# Patient Record
Sex: Male | Born: 1965
Health system: Southern US, Community
[De-identification: ages and names within clinical notes are randomized; demographics above are authoritative.]

## PROBLEM LIST (undated history)

## (undated) DIAGNOSIS — I10 Essential (primary) hypertension: Secondary | ICD-10-CM

## (undated) DIAGNOSIS — M1990 Unspecified osteoarthritis, unspecified site: Secondary | ICD-10-CM

## (undated) DIAGNOSIS — E7800 Pure hypercholesterolemia, unspecified: Secondary | ICD-10-CM

## (undated) DIAGNOSIS — K219 Gastro-esophageal reflux disease without esophagitis: Secondary | ICD-10-CM

## (undated) DIAGNOSIS — F419 Anxiety disorder, unspecified: Secondary | ICD-10-CM

## (undated) HISTORY — DX: Gastro-esophageal reflux disease without esophagitis: K21.9

## (undated) HISTORY — PX: BONE CYST EXCISION: SHX376

## (undated) HISTORY — DX: Anxiety disorder, unspecified: F41.9

## (undated) HISTORY — DX: Essential (primary) hypertension: I10

## (undated) HISTORY — PX: INCISION AND DRAINAGE: SHX5863

---

## 2004-04-02 ENCOUNTER — Ambulatory Visit (HOSPITAL_COMMUNITY): Admission: RE | Admit: 2004-04-02 | Discharge: 2004-04-02 | Payer: Self-pay | Admitting: Family Medicine

## 2004-04-03 ENCOUNTER — Ambulatory Visit (HOSPITAL_COMMUNITY): Admission: RE | Admit: 2004-04-03 | Discharge: 2004-04-03 | Payer: Self-pay | Admitting: Family Medicine

## 2006-03-19 ENCOUNTER — Ambulatory Visit (HOSPITAL_COMMUNITY): Admission: RE | Admit: 2006-03-19 | Discharge: 2006-03-19 | Payer: Self-pay | Admitting: Family Medicine

## 2007-02-05 ENCOUNTER — Inpatient Hospital Stay (HOSPITAL_COMMUNITY): Admission: EM | Admit: 2007-02-05 | Discharge: 2007-02-07 | Payer: Self-pay | Admitting: Emergency Medicine

## 2011-02-04 NOTE — Op Note (Signed)
NAME:  Victor Conner, Victor Conner NO.:  [id number]   MEDICAL RECORD NO.:  [id number]          PATIENT TYPE:  OBV   LOCATION:  5114                         FACILITY:  [facility]   PHYSICIAN:  Artist Pais. Weingold, M.D.DATE OF BIRTH:  12-Nov-1965   DATE OF PROCEDURE:  02/07/2007  DATE OF DISCHARGE:                               OPERATIVE REPORT   PREOPERATIVE DIAGNOSIS:  Right hand web space abscess.   POSTOPERATIVE DIAGNOSIS:  Right hand web space abscess.   PROCEDURE:  Repeat I&D and secondary wound closure over a 1/4-inch  Penrose drain.   SURGEON:  Artist Pais. Mina Marble, M.D.   ASSISTANT:  None.   ANESTHESIA:  General.   TOURNIQUET:  No tourniquet time.   COMPLICATIONS:  No complications.   OPERATIVE REPORT:  The patient was taken to the operating suite.  After  the induction of adequate general anesthesia, the right upper extremity  was prepped and draped in sterile fashion.  Once this was done, the  previously irrigated and debrided wound was debrided of clot.  The deep  tissues appeared to be healthy with early granulation tissue.  This  wound was then thoroughly irrigated using 3 L normal saline under pulse  lavage.  It was then closed secondarily over a Penrose drain with 2-0  nylon.  A sterile dressing of Xeroform, 4 x 4s and a compression wrap  was applied.  Patient tolerated the procedure well.      Artist Pais Mina Marble, M.D.  Electronically Signed     MAW/MEDQ  D:  02/07/2007  T:  02/07/2007  Job:  [id number]

## 2011-02-04 NOTE — Op Note (Signed)
NAME:  Victor Conner, Victor Conner NO.:  [id number]   MEDICAL RECORD NO.:  [id number]          PATIENT TYPE:  OBV   LOCATION:  2550                         FACILITY:  [facility]   PHYSICIAN:  Artist Pais. Weingold, M.D.DATE OF BIRTH:  02-Apr-1966   DATE OF PROCEDURE:  02/05/2007  DATE OF DISCHARGE:                               OPERATIVE REPORT   PREOPERATIVE DIAGNOSIS:  Deep abscess, right hand web space between  thumb and index finger dorsally.   POSTOPERATIVE DIAGNOSIS:  Deep abscess, right hand web space between  thumb and index finger dorsally.   PROCEDURE:  Irrigation and debridement of above with culturing and open  packing.   SURGEON:  Artist Pais. Mina Marble, M.D.   ASSISTANT:  None.   ANESTHESIA:  General.   TOURNIQUET TIME:  28 minutes.   COMPLICATIONS:  None.   DRAINS:  None.   SPECIMENS:  Cultures x2 sent.   OPERATIVE REPORT:  The patient was taken to the operating suite after  induction of general anesthesia.  The right upper extremity was prepped  and draped in the usual sterile fashion.  An Esmarch was used to  exsanguinate the limb.  The tourniquet was inflated to 250 mmHg.  At  this point in time, the web space between thumb and index finger on the  right hand was incised longitudinally.  Purulence was encountered.  The  rest of the skin was incised.  There was a large, deep abscess going  down to the muscle between the thumb and index finger and the first  dorsal intraosseous muscle.  This was cultured.  Purulent material again  was encountered upon incising the skin.  All devitalized tissue was  removed using a rongeur and tenotomy scissors, was thoroughly irrigated  with 2 liters normal saline under pulsatile lavage.  It was then packed  open with 1/4 inch Iodoform gauze.  The patient was then placed using  sterile dressing, 4 x 4's, fluffs and a compression wrap.  The patient  tolerated the procedure well.      Artist Pais Mina Marble, M.D.  Electronically Signed     MAW/MEDQ  D:  02/05/2007  T:  02/06/2007  Job:  [id number]

## 2011-02-04 NOTE — Consult Note (Signed)
NAME:  DUSAN, LIPFORD NO.:  [id number]   MEDICAL RECORD NO.:  [id number]          PATIENT TYPE:  OBV   LOCATION:  2550                         FACILITY:  [facility]   PHYSICIAN:  Artist Pais. Weingold, M.D.DATE OF BIRTH:  29-Oct-1965   DATE OF CONSULTATION:  02/05/2007  DATE OF DISCHARGE:                                 CONSULTATION   REASON FOR CONSULTATION:  Mr. Falletta is a 45 year old left hand dominant  male who presents today with what appears to be a large MRSA abscess on  the dorsal aspect of his nondominant right hand between the thumb and  index finger in the web space overlying the first dorsal interosseous  muscle.   HISTORY OF PRESENT ILLNESS:  He is 45 years old.  He has no known drug  allergies.  He started taking Sudafed only.  He was seen at [facility],  given Rocephin yesterday, and returned today with worsening in his  symptoms.  He presented with an obvious large abscess on his right hand.  Again, he has no known drug allergies, takes Sudafed only.  X-rays from  the outside facility were, by history, negative.  He denies any recent  other infections or abscesses, but says he may have lacerated his hand a  week ago and he works doing heating and air.   PHYSICAL EXAMINATION:  Examination reveals a large 3 x 4 cm raised, red  area in the web space between the thumb and index finger dorsally with  purulent discharge.   IMPRESSION:  We have a 45 year old male with a large abscess, dorsal  aspect of his nondominant right hand.   RECOMMENDATIONS:  Recommendation at this point in time would be to take  him to the operating room for I-and-D for a suspected large MRSA  abscess.  He will be admitted for IV vancomycin, cultures, repeat I-and-  D as necessary.      Artist Pais Mina Marble, M.D.  Electronically Signed     MAW/MEDQ  D:  02/05/2007  T:  02/06/2007  Job:  [id number]

## 2018-04-29 ENCOUNTER — Ambulatory Visit (INDEPENDENT_AMBULATORY_CARE_PROVIDER_SITE_OTHER): Payer: Self-pay | Admitting: Physician Assistant

## 2018-05-06 ENCOUNTER — Encounter (INDEPENDENT_AMBULATORY_CARE_PROVIDER_SITE_OTHER): Payer: Self-pay | Admitting: Physician Assistant

## 2018-05-06 ENCOUNTER — Ambulatory Visit (INDEPENDENT_AMBULATORY_CARE_PROVIDER_SITE_OTHER): Payer: 59 | Admitting: Physician Assistant

## 2018-05-06 ENCOUNTER — Ambulatory Visit (INDEPENDENT_AMBULATORY_CARE_PROVIDER_SITE_OTHER): Payer: 59

## 2018-05-06 DIAGNOSIS — M1712 Unilateral primary osteoarthritis, left knee: Secondary | ICD-10-CM

## 2018-05-06 NOTE — Progress Notes (Signed)
   Office Visit Note   Patient: Victor Conner           Date of Birth: [birth year]           MRN: [id number] Visit Date: 05/06/2018              Requested by: No referring provider defined for this encounter. PCP: Lucia Gaskins, MD   Assessment & Plan: Visit Diagnoses:  1. Primary osteoarthritis of left knee     Plan: Discussed with patient the knee friendly exercises.  Showed him quad strengthening exercises.  Discussed weight loss.  Conservative management consist over-the-counter NSAIDs and natural anti-inflammatories which were discussed with him.  Offered cortisone injection left knee he defers.  See him back on an as-needed basis pain persist or becomes worse.  Follow-Up Instructions: Return if symptoms worsen or fail to improve.   Orders:  Orders Placed This Encounter  Procedures  . XR KNEE 3 VIEW LEFT   No orders of the defined types were placed in this encounter.     Procedures: No procedures performed   Clinical Data: No additional findings.   Subjective: Chief Complaint  Patient presents with  . Left Knee - Pain    HPI [name] 52 year old male comes in today with left knee pain ongoing for the past year.  Has had no known injury.  He did buy an over-the-counter knee brace which is helped some with his pain.  He also states he is lost 8 pounds and this is helped some with his knee pain.  Takes Aleve on bad days.  Does have some painful popping but no locking catching or giving way.  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  Therefore he is crawling around on his knees a lot.  He does take a blood pressure medication reflux medication he is unsure of the names. Review of Systems Please see HPI otherwise negative  Objective: Vital Signs: 6 foot 4 274 pounds  Physical Exam  Constitutional: He is oriented to person, place, and time. He appears well-developed and well-nourished. No distress.  Pulmonary/Chest: Effort normal.  Neurological: He is  alert and oriented to person, place, and time.  Skin: He is not diaphoretic.    Ortho Exam Bilateral knees good range of motion.  Significant patellofemoral crepitus bilateral knees.  Slight tenderness of the medial joint line of the left knee only.  No instability with valgus varus stressing.  Does have some callus formation of the patella region of both knees.  No effusion abnormal warmth erythema of either knee. Specialty Comments:  No specialty comments available.  Imaging: Xr Knee 3 View Left  Result Date: 05/06/2018 Left knee 3 views: Tricompartmental changes with severe bone-on-bone medial compartmental arthritis.  Mild lateral compartmental arthritic changes.  Moderate patellofemoral changes.  No acute fractures.    PMFS History: There are no active problems to display for this patient.  History reviewed. No pertinent past medical history.  History reviewed. No pertinent family history.  History reviewed. No pertinent surgical history. Social History   Occupational History  . Not on file  Tobacco Use  . Smoking status: Not on file  Substance and Sexual Activity  . Alcohol use: Not on file  . Drug use: Not on file  . Sexual activity: Not on file

## 2019-04-27 ENCOUNTER — Other Ambulatory Visit: Payer: Self-pay | Admitting: Family Medicine

## 2019-04-27 ENCOUNTER — Other Ambulatory Visit (HOSPITAL_COMMUNITY): Payer: Self-pay | Admitting: Family Medicine

## 2019-04-27 DIAGNOSIS — M542 Cervicalgia: Secondary | ICD-10-CM

## 2019-05-02 ENCOUNTER — Other Ambulatory Visit: Payer: Self-pay

## 2019-05-02 ENCOUNTER — Ambulatory Visit (HOSPITAL_COMMUNITY)
Admission: RE | Admit: 2019-05-02 | Discharge: 2019-05-02 | Disposition: A | Discharge status: Home / Self Care | Payer: BLUE CROSS/BLUE SHIELD | Source: Ambulatory Visit | Attending: Family Medicine | Admitting: Family Medicine

## 2019-05-02 DIAGNOSIS — M542 Cervicalgia: Secondary | ICD-10-CM | POA: Diagnosis not present

## 2019-05-04 ENCOUNTER — Encounter: Payer: Self-pay | Admitting: Orthopaedic Surgery

## 2019-05-04 ENCOUNTER — Ambulatory Visit (INDEPENDENT_AMBULATORY_CARE_PROVIDER_SITE_OTHER): Payer: BLUE CROSS/BLUE SHIELD | Admitting: Orthopaedic Surgery

## 2019-05-04 ENCOUNTER — Ambulatory Visit: Payer: Self-pay

## 2019-05-04 VITALS — BP 150/99 | HR 90 | Ht 75.0 in | Wt 270.0 lb

## 2019-05-04 DIAGNOSIS — M4802 Spinal stenosis, cervical region: Secondary | ICD-10-CM | POA: Insufficient documentation

## 2019-05-04 DIAGNOSIS — M542 Cervicalgia: Secondary | ICD-10-CM

## 2019-05-04 NOTE — Progress Notes (Addendum)
Office Visit Note   Patient: Victor Conner           Date of Birth: 02/02/1966           MRN: [id number] Visit Date: 05/04/2019              Requested by: Lucia Gaskins, MD [id number] [place],  [place] [address] PCP: Lucia Gaskins, MD   Assessment & Plan: Visit Diagnoses:  1. Neck pain   2. Spinal stenosis of cervical region            With myelopathy  Plan: Patient has myelopathy from cervical compression with only a 4 to 5 mm canal due to disc herniation and cord compression with abnormal signal in the cord at C4-5 consistent with myelomalacia/edema.  He also has severe compression at C5-6 down to 6.5 mm.  More compression on the left than right at the C5-6 level.  He does have stenosis at C6-7 but not as severe compression and also mild to moderate narrowing at C3-4.  Plan to be two-level cervical fusion C4-5 and C5-6  with allograft and plate.  He understands that this is to prevent further cord damage.  Risk surgery discussed including pseudoarthrosis.  He understands that he may get progression at adjacent levels and may require something done at C6-7 and future but currently has not required.  Risk of dysphasia dysphonia use of a soft collar overnight stay in the hospital discussed.  Questions were elicited and answered he understands and requests we proceed.  We discussed this needs to be done is fairly urgent procedure we will schedule for surgery next week.  Work slip given for no work x7 weeks.  Follow-Up Instructions: No follow-ups on file.   Orders:  Orders Placed This Encounter  Procedures  . XR Cervical Spine 2 or 3 views   No orders of the defined types were placed in this encounter.     Procedures: No procedures performed   Clinical Data: No additional findings.   Subjective: Chief Complaint  Patient presents with  . 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  He took a motorcycle class and has also noticed progressive difficulty walking.  He has been treated with prednisone pack he is got numbness and tingling in both legs worse on the left than right and MRI scan ordered by Dr. Cindie Laroche done on 05/02/2019 2 days ago shows severe compression and abnormal cord signal.  Patient is noted some stumbling and wide spaced gait and feels that his legs are weak.  Review of Systems Positive for hypertension, negative for cardiac problems.  Negative for stroke GI problems endocrine.  He does take Zocor for his cholesterol.  He has had prednisone pack as well as Naprosyn for the neck problem.  He takes lisinopril for his blood pressure otherwise 14 point systems negative other than as mentioned in HPI.  Objective: Vital Signs: BP (!) 150/99   Pulse 90   Ht 6\' 3"  (1.905 m)   Wt 270 lb (122.5 kg)   BMI 33.75 kg/m   Physical Exam Constitutional:      Appearance: He is well-developed.  HENT:     Head: Normocephalic and atraumatic.  Eyes:     Pupils: Pupils are equal, round, and reactive to light.  Neck:     Thyroid: No thyromegaly.  Trachea: No tracheal deviation.  Cardiovascular:     Rate and Rhythm: Normal rate.  Pulmonary:     Effort: Pulmonary effort is normal.     Breath sounds: No wheezing.  Abdominal:     General: Bowel sounds are normal.     Palpations: Abdomen is soft.  Skin:    General: Skin is warm and dry.     Capillary Refill: Capillary refill takes less than 2 seconds.  Neurological:     Mental Status: He is alert and oriented to person, place, and time.  Psychiatric:        Behavior: Behavior normal.        Thought Content: Thought content normal.        Judgment: Judgment normal.     Ortho Exam patient has myelopathic gait pattern slightly wide based gait short stride.  There is bilateral clonus both ankles with 4+ knee jerk with clonus.  Patient has weakness of the upper extremities  diffusely biceps triceps wrist flexion extension.  No muscle atrophy.  Decreased sensation both hands all fingers.  Interossei are weak.  Patient has 3+ left triceps reflex absent biceps reflex on the left and 2+ brachial radialis.  On the right side he has 2+ brachial radialis biceps and triceps reflex.  Specialty Comments:  No specialty comments available.  Imaging: Xr Cervical Spine 2 Or 3 Views  Result Date: 05/04/2019 AP lateral cervical spine x-rays are obtained and reviewed this shows normal cervical curvature.  There is disc space narrowing at C4-5, C5-6 and C6-7 with some endplate spurring. Impression: Mid cervical spondylosis as described above. \CLINICAL DATA:  Neck pain radiating down both arms over the last 3 weeks.  EXAM: MRI CERVICAL SPINE WITHOUT CONTRAST  TECHNIQUE: Multiplanar, multisequence MR imaging of the cervical spine was performed. No intravenous contrast was administered.  COMPARISON:  None.  FINDINGS: Alignment: Straightening of the normal cervical lordosis.  Vertebrae: No fracture or primary bone lesion.  Cord: No primary cord lesion. See below regarding stenosis and cord compromise at the C4-5 level.  Posterior Fossa, vertebral arteries, paraspinal tissues: Negative  Disc levels:  No abnormality at the foramen magnum, C1-2 or C2-3.  C3-4: Spondylosis with endplate osteophytes and bulging of the disc. Narrowing of the subarachnoid space but no cord compression. AP diameter of the canal in the midline is 8 mm. Mild bilateral foraminal stenosis.  C4-5: Spondylosis with endplate osteophytes and disc herniation. Severe spinal stenosis with AP diameter of the canal in the midline only 4 to 5 mm. Effacement of the subarachnoid space and flattening the cord. Abnormal T2 signal in the cord at this level. Bilateral foraminal stenosis could compress the C5 nerves.  C5-6: Spondylosis with endplate osteophytes and bulging disc material more  prominent on the left. AP diameter of the canal in the midline 6.5 mm. Effacement of the subarachnoid space with flattening of the left side of the cord. Foraminal stenosis left worse than right that could compress the C6 nerves, particularly the left.  C6-7: Spondylosis with endplate osteophytes and bulging disc material more prominent towards the left. AP diameter of the canal in the midline is 6.5-7 mm. Mild deformity of the left side of the cord. Foraminal stenosis left worse than right could affect either C7 nerve, particularly the left.  C7-T1: Mild bulging of the disc. Mild facet osteoarthritis. No compressive canal or foraminal stenosis.  IMPRESSION: C4-5: Severe spinal stenosis due to endplate osteophytes and broad-based disc herniation. Canal stenosis with AP diameter  in the midline only 4-5 mm. The subarachnoid space is effaced and the cord is flattened with abnormal T2 signal within the cord. Bilateral foraminal stenosis at this level could additionally compress the C5 nerves. Consideration of surgical decompression at least at this level seems prudent.  C3-4: Spondylosis with mild canal narrowing, AP diameter 8 mm, non-compressive. Bilateral foraminal narrowing could affect the C4 nerves.  C5-6 and C6-7 show endplate osteophytes and left-sided disc bulges with AP diameter of the canal narrowed in the range of 6-7 mm, worse on the left. Some cord deformity on the left at both of these levels. Foraminal narrowing left more than right at both of these levels could cause nerve compression, particularly on the left.   Electronically Signed   By: Nelson Chimes M.D.   On: 05/02/2019 14:14    PMFS History: Patient Active Problem List   Diagnosis Date Noted  . Spinal stenosis of cervical region 05/04/2019   Past Medical History:  Diagnosis Date  . Hypertension     No family history on file.  No past surgical history on file. Social History    Occupational History  . Not on file  Tobacco Use  . Smoking status: Not on file  Substance and Sexual Activity  . Alcohol use: Not on file  . Drug use: Not on file  . Sexual activity: Not on file

## 2019-05-05 ENCOUNTER — Other Ambulatory Visit (HOSPITAL_COMMUNITY)
Admission: RE | Admit: 2019-05-05 | Discharge: 2019-05-05 | Disposition: A | Discharge status: Home / Self Care | Payer: BLUE CROSS/BLUE SHIELD | Source: Ambulatory Visit | Attending: Orthopaedic Surgery | Admitting: Orthopaedic Surgery

## 2019-05-05 ENCOUNTER — Other Ambulatory Visit: Payer: Self-pay

## 2019-05-05 DIAGNOSIS — M4802 Spinal stenosis, cervical region: Secondary | ICD-10-CM | POA: Insufficient documentation

## 2019-05-05 DIAGNOSIS — Z01812 Encounter for preprocedural laboratory examination: Secondary | ICD-10-CM | POA: Insufficient documentation

## 2019-05-05 DIAGNOSIS — G992 Myelopathy in diseases classified elsewhere: Secondary | ICD-10-CM | POA: Insufficient documentation

## 2019-05-05 DIAGNOSIS — Z20828 Contact with and (suspected) exposure to other viral communicable diseases: Secondary | ICD-10-CM | POA: Insufficient documentation

## 2019-05-05 LAB — SARS CORONAVIRUS 2 (TAT 6-24 HRS): SARS Coronavirus 2: NEGATIVE

## 2019-05-06 ENCOUNTER — Other Ambulatory Visit: Payer: Self-pay

## 2019-05-06 ENCOUNTER — Encounter (HOSPITAL_COMMUNITY): Payer: Self-pay | Admitting: *Deleted

## 2019-05-06 NOTE — Progress Notes (Signed)
Denies chest pain or shob. Denies cardiology visit. Educated on visitor policy and patient reports he has been following quarantine instructions post COVID test.

## 2019-05-08 NOTE — H&P (Addendum)
Office Visit Note              Patient: Victor Conner                                           Date of Birth: October 26, 1965                                                    MRN: [id number] Visit Date: 05/04/2019                                                                     Requested by: Lucia Gaskins, MD [address] [place],  [place] [address] PCP: Lucia Gaskins, MD   Assessment & Plan: Visit Diagnoses:  1. Neck pain   2. Spinal stenosis of cervical region            With myelopathy  Plan: Patient has myelopathy from cervical compression with only a 4 to 5 mm canal due to disc herniation and cord compression with abnormal signal in the cord at C4-5 consistent with myelomalacia/edema.  He also has severe compression at C5-6 down to 6.5 mm.  More compression on the left than right at the C5-6 level.  He does have stenosis at C6-7 but not as severe compression and also mild to moderate narrowing at C3-4.  Plan to be two-level cervical fusion C4-5 and C5-6  with allograft and plate.  He understands that this is to prevent further cord damage.  Risk surgery discussed including pseudoarthrosis.  He understands that he may get progression at adjacent levels and may require something done at C6-7 and future but currently has not required.  Risk of dysphasia dysphonia use of a soft collar overnight stay in the hospital discussed.  Questions were elicited and answered he understands and requests we proceed.  We discussed this needs to be done is fairly urgent procedure we will schedule for surgery next week.  Work slip given for no work x7 weeks.  Follow-Up Instructions: No follow-ups on file.   Orders:     Orders Placed This Encounter  Procedures  . XR Cervical Spine 2 or 3 views   No orders of the defined types were placed in this encounter.     Procedures: No procedures performed   Clinical Data: No additional findings.   Subjective:    Chief  Complaint  Patient presents with  . 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  He took a motorcycle class and has also noticed progressive difficulty walking.  He has been treated with prednisone pack he is got numbness and tingling in both legs worse on the left than right and MRI scan ordered by Dr. Cindie Laroche done on 05/02/2019 2 days ago shows severe compression and abnormal cord signal.  Patient is noted some stumbling and wide spaced gait and feels that his  legs are weak.  Review of Systems Positive for hypertension, negative for cardiac problems.  Negative for stroke GI problems endocrine.  He does take Zocor for his cholesterol.  He has had prednisone pack as well as Naprosyn for the neck problem.  He takes lisinopril for his blood pressure otherwise 14 point systems negative other than as mentioned in HPI.  Objective: Vital Signs: BP (!) 150/99   Pulse 90   Ht 6\' 3"  (1.905 m)   Wt 270 lb (122.5 kg)   BMI 33.75 kg/m   Physical Exam Constitutional:      Appearance: He is well-developed.  HENT:     Head: Normocephalic and atraumatic.  Eyes:     Pupils: Pupils are equal, round, and reactive to light.  Neck:     Thyroid: No thyromegaly.     Trachea: No tracheal deviation.  Cardiovascular:     Rate and Rhythm: Normal rate.  Pulmonary:     Effort: Pulmonary effort is normal.     Breath sounds: No wheezing.  Abdominal:     General: Bowel sounds are normal.     Palpations: Abdomen is soft.  Skin:    General: Skin is warm and dry.     Capillary Refill: Capillary refill takes less than 2 seconds.  Neurological:     Mental Status: He is alert and oriented to person, place, and time.  Psychiatric:        Behavior: Behavior normal.        Thought Content: Thought content normal.        Judgment: Judgment normal.     Ortho Exam patient has myelopathic gait pattern  slightly wide based gait short stride.  There is bilateral clonus both ankles with 4+ knee jerk with clonus.  Patient has weakness of the upper extremities diffusely biceps triceps wrist flexion extension.  No muscle atrophy.  Decreased sensation both hands all fingers.  Interossei are weak.  Patient has 3+ left triceps reflex absent biceps reflex on the left and 2+ brachial radialis.  On the right side he has 2+ brachial radialis biceps and triceps reflex.  Specialty Comments:  No specialty comments available.  Imaging: Xr Cervical Spine 2 Or 3 Views  Result Date: 05/04/2019 AP lateral cervical spine x-rays are obtained and reviewed this shows normal cervical curvature.  There is disc space narrowing at C4-5, C5-6 and C6-7 with some endplate spurring. Impression: Mid cervical spondylosis as described above. \CLINICAL DATA: Neck pain radiating down both arms over the last 3 weeks.  EXAM: MRI CERVICAL SPINE WITHOUT CONTRAST  TECHNIQUE: Multiplanar, multisequence MR imaging of the cervical spine was performed. No intravenous contrast was administered.  COMPARISON: None.  FINDINGS: Alignment: Straightening of the normal cervical lordosis.  Vertebrae: No fracture or primary bone lesion.  Cord: No primary cord lesion. See below regarding stenosis and cord compromise at the C4-5 level.  Posterior Fossa, vertebral arteries, paraspinal tissues: Negative  Disc levels:  No abnormality at the foramen magnum, C1-2 or C2-3.  C3-4: Spondylosis with endplate osteophytes and bulging of the disc. Narrowing of the subarachnoid space but no cord compression. AP diameter of the canal in the midline is 8 mm. Mild bilateral foraminal stenosis.  C4-5: Spondylosis with endplate osteophytes and disc herniation. Severe spinal stenosis with AP diameter of the canal in the midline only 4 to 5 mm. Effacement of the subarachnoid space and flattening the cord. Abnormal T2 signal in the  cord at this level. Bilateral foraminal stenosis could  compress the C5 nerves.  C5-6: Spondylosis with endplate osteophytes and bulging disc material more prominent on the left. AP diameter of the canal in the midline 6.5 mm. Effacement of the subarachnoid space with flattening of the left side of the cord. Foraminal stenosis left worse than right that could compress the C6 nerves, particularly the left.  C6-7: Spondylosis with endplate osteophytes and bulging disc material more prominent towards the left. AP diameter of the canal in the midline is 6.5-7 mm. Mild deformity of the left side of the cord. Foraminal stenosis left worse than right could affect either C7 nerve, particularly the left.  C7-T1: Mild bulging of the disc. Mild facet osteoarthritis. No compressive canal or foraminal stenosis.  IMPRESSION: C4-5: Severe spinal stenosis due to endplate osteophytes and broad-based disc herniation. Canal stenosis with AP diameter in the midline only 4-5 mm. The subarachnoid space is effaced and the cord is flattened with abnormal T2 signal within the cord. Bilateral foraminal stenosis at this level could additionally compress the C5 nerves. Consideration of surgical decompression at least at this level seems prudent.  C3-4: Spondylosis with mild canal narrowing, AP diameter 8 mm, non-compressive. Bilateral foraminal narrowing could affect the C4 nerves.  C5-6 and C6-7 show endplate osteophytes and left-sided disc bulges with AP diameter of the canal narrowed in the range of 6-7 mm, worse on the left. Some cord deformity on the left at both of these levels. Foraminal narrowing left more than right at both of these levels could cause nerve compression, particularly on the left.   Electronically Signed By: Nelson Chimes M.D. On: 05/02/2019 14:14    PMFS History:     Patient Active Problem List   Diagnosis Date Noted  . Spinal stenosis of cervical region  05/04/2019       Past Medical History:  Diagnosis Date  . Hypertension     No family history on file.  No past surgical history on file. Social History       Occupational History  . Not on file  Tobacco Use  . Smoking status: Not on file  Substance and Sexual Activity  . Alcohol use: Not on file  . Drug use: Not on file  . Sexual activity: Not on file

## 2019-05-09 ENCOUNTER — Encounter (HOSPITAL_COMMUNITY)
Admission: RE | Disposition: A | Discharge status: Home / Self Care | Payer: Self-pay | Source: Ambulatory Visit | Attending: Orthopaedic Surgery

## 2019-05-09 ENCOUNTER — Other Ambulatory Visit: Payer: Self-pay

## 2019-05-09 ENCOUNTER — Encounter (HOSPITAL_COMMUNITY): Payer: Self-pay | Admitting: *Deleted

## 2019-05-09 ENCOUNTER — Ambulatory Visit (HOSPITAL_COMMUNITY): Payer: BLUE CROSS/BLUE SHIELD | Admitting: Anesthesiology

## 2019-05-09 ENCOUNTER — Observation Stay (HOSPITAL_COMMUNITY)
Admission: RE | Admit: 2019-05-09 | Discharge: 2019-05-10 | Disposition: A | Discharge status: Home / Self Care | Payer: BLUE CROSS/BLUE SHIELD | Source: Ambulatory Visit | Attending: Orthopaedic Surgery | Admitting: Orthopaedic Surgery

## 2019-05-09 ENCOUNTER — Ambulatory Visit (HOSPITAL_COMMUNITY): Payer: BLUE CROSS/BLUE SHIELD

## 2019-05-09 DIAGNOSIS — Z79899 Other long term (current) drug therapy: Secondary | ICD-10-CM | POA: Insufficient documentation

## 2019-05-09 DIAGNOSIS — I10 Essential (primary) hypertension: Secondary | ICD-10-CM | POA: Diagnosis not present

## 2019-05-09 DIAGNOSIS — M2578 Osteophyte, vertebrae: Secondary | ICD-10-CM | POA: Insufficient documentation

## 2019-05-09 DIAGNOSIS — R2689 Other abnormalities of gait and mobility: Secondary | ICD-10-CM | POA: Insufficient documentation

## 2019-05-09 DIAGNOSIS — Z419 Encounter for procedure for purposes other than remedying health state, unspecified: Secondary | ICD-10-CM

## 2019-05-09 DIAGNOSIS — M4802 Spinal stenosis, cervical region: Secondary | ICD-10-CM | POA: Diagnosis not present

## 2019-05-09 DIAGNOSIS — Z885 Allergy status to narcotic agent status: Secondary | ICD-10-CM | POA: Insufficient documentation

## 2019-05-09 DIAGNOSIS — E7800 Pure hypercholesterolemia, unspecified: Secondary | ICD-10-CM | POA: Diagnosis not present

## 2019-05-09 DIAGNOSIS — M6281 Muscle weakness (generalized): Secondary | ICD-10-CM | POA: Diagnosis not present

## 2019-05-09 DIAGNOSIS — M50021 Cervical disc disorder at C4-C5 level with myelopathy: Secondary | ICD-10-CM | POA: Diagnosis not present

## 2019-05-09 DIAGNOSIS — R262 Difficulty in walking, not elsewhere classified: Secondary | ICD-10-CM | POA: Insufficient documentation

## 2019-05-09 HISTORY — PX: ANTERIOR CERVICAL DECOMP/DISCECTOMY FUSION: SHX1161

## 2019-05-09 HISTORY — DX: Pure hypercholesterolemia, unspecified: E78.00

## 2019-05-09 LAB — BASIC METABOLIC PANEL
Anion gap: 10 (ref 5–15)
BUN: 14 mg/dL (ref 6–20)
CO2: 25 mmol/L (ref 22–32)
Calcium: 8.9 mg/dL (ref 8.9–10.3)
Chloride: 106 mmol/L (ref 98–111)
Creatinine, Ser: 0.99 mg/dL (ref 0.61–1.24)
GFR calc Af Amer: >60 mL/min (ref >60)
GFR calc non Af Amer: >60 mL/min (ref >60)
Glucose, Bld: 99 mg/dL (ref 70–99)
Potassium: 4.3 mmol/L (ref 3.5–5.1)
Sodium: 141 mmol/L (ref 135–145)

## 2019-05-09 LAB — CBC
HCT: 41.7 % (ref 39.0–52.0)
Hemoglobin: 13.4 g/dL (ref 13.0–17.0)
MCH: 30.6 pg (ref 26.0–34.0)
MCHC: 32.1 g/dL (ref 30.0–36.0)
MCV: 95.2 fL (ref 80.0–100.0)
Platelets: 213 10*3/uL (ref 150–400)
RBC: 4.38 MIL/uL (ref 4.22–5.81)
RDW: 13 % (ref 11.5–15.5)
WBC: 8 10*3/uL (ref 4.0–10.5)
nRBC: 0 % (ref 0.0–0.2)

## 2019-05-09 SURGERY — ANTERIOR CERVICAL DECOMPRESSION/DISCECTOMY FUSION 2 LEVELS
Anesthesia: General | Site: Spine Cervical

## 2019-05-09 MED ORDER — ACETAMINOPHEN 325 MG PO TABS: 650.0000 mg | ORAL_TABLET | ORAL | Status: DC | PRN

## 2019-05-09 MED ORDER — ONDANSETRON HCL 4 MG/2ML IJ SOLN: 4.0000 mg | Freq: Four times a day (QID) | INTRAMUSCULAR | Status: DC | PRN

## 2019-05-09 MED ORDER — VANCOMYCIN HCL 10 G IV SOLR
1500.0000 mg | INTRAVENOUS | Status: AC
  Administered 2019-05-09: 1500 mg via INTRAVENOUS
  Filled 2019-05-09 (×2): qty 1500

## 2019-05-09 MED ORDER — METHOCARBAMOL 1000 MG/10ML IJ SOLN
500.0000 mg | Freq: Four times a day (QID) | INTRAVENOUS | Status: DC | PRN
  Filled 2019-05-09: qty 5

## 2019-05-09 MED ORDER — BUPIVACAINE-EPINEPHRINE 0.25% -1:200000 IJ SOLN
INTRAMUSCULAR | Status: DC | PRN
  Administered 2019-05-09: 7 mL

## 2019-05-09 MED ORDER — VITAMIN D 25 MCG (1000 UNIT) PO TABS
5000.0000 [IU] | ORAL_TABLET | Freq: Every day | ORAL | Status: DC
  Filled 2019-05-09 (×2): qty 5

## 2019-05-09 MED ORDER — POLYETHYLENE GLYCOL 3350 17 G PO PACK: 17.0000 g | PACK | Freq: Every day | ORAL | Status: DC

## 2019-05-09 MED ORDER — SODIUM CHLORIDE 0.9% FLUSH: 3.0000 mL | INTRAVENOUS | Status: DC | PRN

## 2019-05-09 MED ORDER — MIDAZOLAM HCL 2 MG/2ML IJ SOLN
INTRAMUSCULAR | Status: AC
  Filled 2019-05-09: qty 2

## 2019-05-09 MED ORDER — ALBUMIN HUMAN 5 % IV SOLN
INTRAVENOUS | Status: DC | PRN
  Administered 2019-05-09: 16:00:00 via INTRAVENOUS

## 2019-05-09 MED ORDER — ROCURONIUM BROMIDE 10 MG/ML (PF) SYRINGE
PREFILLED_SYRINGE | INTRAVENOUS | Status: DC | PRN
  Administered 2019-05-09 (×2): 20 mg via INTRAVENOUS
  Administered 2019-05-09: 80 mg via INTRAVENOUS
  Administered 2019-05-09: 20 mg via INTRAVENOUS

## 2019-05-09 MED ORDER — SODIUM CHLORIDE 0.9 % IV SOLN: INTRAVENOUS | Status: DC

## 2019-05-09 MED ORDER — STERILE WATER FOR IRRIGATION IR SOLN
Status: DC | PRN
  Administered 2019-05-09: 1000 mL

## 2019-05-09 MED ORDER — LACTATED RINGERS IV SOLN
INTRAVENOUS | Status: DC | PRN
  Administered 2019-05-09: 15:00:00 via INTRAVENOUS

## 2019-05-09 MED ORDER — FENTANYL CITRATE (PF) 100 MCG/2ML IJ SOLN
25.0000 ug | INTRAMUSCULAR | Status: DC | PRN
  Administered 2019-05-09 (×2): 25 ug via INTRAVENOUS

## 2019-05-09 MED ORDER — DEXAMETHASONE SODIUM PHOSPHATE 10 MG/ML IJ SOLN
INTRAMUSCULAR | Status: AC
  Filled 2019-05-09: qty 1

## 2019-05-09 MED ORDER — ONDANSETRON HCL 4 MG/2ML IJ SOLN: 4.0000 mg | Freq: Once | INTRAMUSCULAR | Status: DC | PRN

## 2019-05-09 MED ORDER — LIDOCAINE 2% (20 MG/ML) 5 ML SYRINGE
INTRAMUSCULAR | Status: DC | PRN
  Administered 2019-05-09: 100 mg via INTRAVENOUS

## 2019-05-09 MED ORDER — ROCURONIUM BROMIDE 10 MG/ML (PF) SYRINGE
PREFILLED_SYRINGE | INTRAVENOUS | Status: AC
  Filled 2019-05-09: qty 20

## 2019-05-09 MED ORDER — PHENOL 1.4 % MT LIQD
1.0000 | OROMUCOSAL | Status: DC | PRN
  Administered 2019-05-10: 1 via OROMUCOSAL
  Filled 2019-05-09: qty 177

## 2019-05-09 MED ORDER — SIMVASTATIN 20 MG PO TABS
40.0000 mg | ORAL_TABLET | Freq: Every day | ORAL | Status: DC
  Administered 2019-05-09: 40 mg via ORAL
  Filled 2019-05-09: qty 2

## 2019-05-09 MED ORDER — FENTANYL CITRATE (PF) 100 MCG/2ML IJ SOLN
INTRAMUSCULAR | Status: AC
  Administered 2019-05-09: 25 ug via INTRAVENOUS
  Filled 2019-05-09: qty 2

## 2019-05-09 MED ORDER — CHLORHEXIDINE GLUCONATE 4 % EX LIQD: 60.0000 mL | Freq: Once | CUTANEOUS | Status: DC

## 2019-05-09 MED ORDER — FENTANYL CITRATE (PF) 100 MCG/2ML IJ SOLN
INTRAMUSCULAR | Status: DC | PRN
  Administered 2019-05-09: 50 ug via INTRAVENOUS
  Administered 2019-05-09: 100 ug via INTRAVENOUS
  Administered 2019-05-09 (×2): 50 ug via INTRAVENOUS

## 2019-05-09 MED ORDER — DOCUSATE SODIUM 100 MG PO CAPS
100.0000 mg | ORAL_CAPSULE | Freq: Two times a day (BID) | ORAL | Status: DC
  Administered 2019-05-09: 100 mg via ORAL
  Filled 2019-05-09: qty 1

## 2019-05-09 MED ORDER — HYDROCODONE-ACETAMINOPHEN 5-325 MG PO TABS
1.0000 | ORAL_TABLET | ORAL | Status: DC | PRN
  Administered 2019-05-10: 1 via ORAL
  Filled 2019-05-09: qty 1

## 2019-05-09 MED ORDER — ATORVASTATIN CALCIUM 10 MG PO TABS: 20.0000 mg | ORAL_TABLET | Freq: Every day | ORAL | Status: DC

## 2019-05-09 MED ORDER — LIDOCAINE 2% (20 MG/ML) 5 ML SYRINGE
INTRAMUSCULAR | Status: AC
  Filled 2019-05-09: qty 10

## 2019-05-09 MED ORDER — ONDANSETRON HCL 4 MG/2ML IJ SOLN
INTRAMUSCULAR | Status: DC | PRN
  Administered 2019-05-09: 4 mg via INTRAVENOUS

## 2019-05-09 MED ORDER — MIDAZOLAM HCL 2 MG/2ML IJ SOLN
INTRAMUSCULAR | Status: DC | PRN
  Administered 2019-05-09: 2 mg via INTRAVENOUS

## 2019-05-09 MED ORDER — FENTANYL CITRATE (PF) 250 MCG/5ML IJ SOLN
INTRAMUSCULAR | Status: AC
  Filled 2019-05-09: qty 5

## 2019-05-09 MED ORDER — BUPIVACAINE-EPINEPHRINE (PF) 0.25% -1:200000 IJ SOLN
INTRAMUSCULAR | Status: AC
  Filled 2019-05-09: qty 30

## 2019-05-09 MED ORDER — SUCCINYLCHOLINE CHLORIDE 200 MG/10ML IV SOSY
PREFILLED_SYRINGE | INTRAVENOUS | Status: AC
  Filled 2019-05-09: qty 10

## 2019-05-09 MED ORDER — EPHEDRINE SULFATE-NACL 50-0.9 MG/10ML-% IV SOSY
PREFILLED_SYRINGE | INTRAVENOUS | Status: DC | PRN
  Administered 2019-05-09: 5 mg via INTRAVENOUS

## 2019-05-09 MED ORDER — LISINOPRIL 20 MG PO TABS
20.0000 mg | ORAL_TABLET | Freq: Every day | ORAL | Status: DC
  Administered 2019-05-09: 20 mg via ORAL
  Filled 2019-05-09 (×2): qty 1

## 2019-05-09 MED ORDER — ONDANSETRON HCL 4 MG PO TABS: 4.0000 mg | ORAL_TABLET | Freq: Four times a day (QID) | ORAL | Status: DC | PRN

## 2019-05-09 MED ORDER — PROPOFOL 10 MG/ML IV BOLUS
INTRAVENOUS | Status: DC | PRN
  Administered 2019-05-09: 200 mg via INTRAVENOUS

## 2019-05-09 MED ORDER — DEXAMETHASONE SODIUM PHOSPHATE 10 MG/ML IJ SOLN
INTRAMUSCULAR | Status: DC | PRN
  Administered 2019-05-09: 10 mg via INTRAVENOUS

## 2019-05-09 MED ORDER — CEFAZOLIN SODIUM-DEXTROSE 1-4 GM/50ML-% IV SOLN
1.0000 g | Freq: Three times a day (TID) | INTRAVENOUS | Status: AC
  Administered 2019-05-09 – 2019-05-10 (×2): 1 g via INTRAVENOUS
  Filled 2019-05-09 (×2): qty 50

## 2019-05-09 MED ORDER — SODIUM CHLORIDE 0.9% FLUSH
3.0000 mL | Freq: Two times a day (BID) | INTRAVENOUS | Status: DC
  Administered 2019-05-09: 3 mL via INTRAVENOUS

## 2019-05-09 MED ORDER — ACETAMINOPHEN 650 MG RE SUPP: 650.0000 mg | RECTAL | Status: DC | PRN

## 2019-05-09 MED ORDER — LACTATED RINGERS IV SOLN
INTRAVENOUS | Status: DC
  Administered 2019-05-09: 13:00:00 via INTRAVENOUS

## 2019-05-09 MED ORDER — OXYCODONE HCL 5 MG/5ML PO SOLN: 5.0000 mg | Freq: Once | ORAL | Status: DC | PRN

## 2019-05-09 MED ORDER — MENTHOL 3 MG MT LOZG
1.0000 | LOZENGE | OROMUCOSAL | Status: DC | PRN
  Administered 2019-05-09: 3 mg via ORAL
  Filled 2019-05-09: qty 9

## 2019-05-09 MED ORDER — ONDANSETRON HCL 4 MG/2ML IJ SOLN
INTRAMUSCULAR | Status: AC
  Filled 2019-05-09: qty 2

## 2019-05-09 MED ORDER — HEMOSTATIC AGENTS (NO CHARGE) OPTIME
TOPICAL | Status: DC | PRN
  Administered 2019-05-09: 1 via TOPICAL

## 2019-05-09 MED ORDER — METHOCARBAMOL 500 MG PO TABS: 500.0000 mg | ORAL_TABLET | Freq: Four times a day (QID) | ORAL | Status: DC | PRN

## 2019-05-09 MED ORDER — PROPOFOL 10 MG/ML IV BOLUS
INTRAVENOUS | Status: AC
  Filled 2019-05-09: qty 20

## 2019-05-09 MED ORDER — SUGAMMADEX SODIUM 200 MG/2ML IV SOLN
INTRAVENOUS | Status: DC | PRN
  Administered 2019-05-09: 200 mg via INTRAVENOUS

## 2019-05-09 MED ORDER — 0.9 % SODIUM CHLORIDE (POUR BTL) OPTIME
TOPICAL | Status: DC | PRN
  Administered 2019-05-09: 1000 mL

## 2019-05-09 MED ORDER — OXYCODONE HCL 5 MG PO TABS: 5.0000 mg | ORAL_TABLET | Freq: Once | ORAL | Status: DC | PRN

## 2019-05-09 MED ORDER — AMLODIPINE BESYLATE 5 MG PO TABS: 5.0000 mg | ORAL_TABLET | Freq: Every day | ORAL | Status: DC

## 2019-05-09 SURGICAL SUPPLY — 59 items
AGENT HMST KT MTR STRL THRMB (HEMOSTASIS) ×1
APL SKNCLS STERI-STRIP NONHPOA (GAUZE/BANDAGES/DRESSINGS) ×1
BENZOIN TINCTURE PRP APPL 2/3 (GAUZE/BANDAGES/DRESSINGS) ×2 IMPLANT
BIT DRILL SRG 14X2.2XFLT CHK (BIT) IMPLANT
BIT DRL SRG 14X2.2XFLT CHK (BIT) ×1
BLADE CLIPPER SURG (BLADE) IMPLANT
BONE CERV LORDOTIC 14.5X12X7 (Bone Implant) ×2 IMPLANT
BONE CERV LORDOTIC 14.5X12X9 (Bone Implant) ×2 IMPLANT
BUR ROUND FLUTED 4 SOFT TCH (BURR) IMPLANT
COLLAR CERV LO CONTOUR FIRM DE (SOFTGOODS) ×1 IMPLANT
CORD BIPOLAR FORCEPS 12FT (ELECTRODE) ×2 IMPLANT
COVER SURGICAL LIGHT HANDLE (MISCELLANEOUS) ×2 IMPLANT
COVER WAND RF STERILE (DRAPES) ×2 IMPLANT
DRAPE C-ARM 42X72 X-RAY (DRAPES) ×2 IMPLANT
DRAPE HALF SHEET 40X57 (DRAPES) ×2 IMPLANT
DRAPE MICROSCOPE LEICA (MISCELLANEOUS) ×2 IMPLANT
DRILL BIT SKYLINE 14MM (BIT) ×2
DURAPREP 6ML APPLICATOR 50/CS (WOUND CARE) ×2 IMPLANT
ELECT COATED BLADE 2.86 ST (ELECTRODE) ×2 IMPLANT
ELECT REM PT RETURN 9FT ADLT (ELECTROSURGICAL) ×2
ELECTRODE REM PT RTRN 9FT ADLT (ELECTROSURGICAL) ×1 IMPLANT
EVACUATOR 1/8 PVC DRAIN (DRAIN) ×2 IMPLANT
GAUZE SPONGE 4X4 12PLY STRL (GAUZE/BANDAGES/DRESSINGS) ×2 IMPLANT
GLOVE BIOGEL PI IND STRL 8 (GLOVE) ×2 IMPLANT
GLOVE BIOGEL PI INDICATOR 8 (GLOVE) ×2
GLOVE ORTHO TXT STRL SZ7.5 (GLOVE) ×4 IMPLANT
GOWN STRL REUS W/ TWL LRG LVL3 (GOWN DISPOSABLE) ×1 IMPLANT
GOWN STRL REUS W/ TWL XL LVL3 (GOWN DISPOSABLE) ×1 IMPLANT
GOWN STRL REUS W/TWL 2XL LVL3 (GOWN DISPOSABLE) ×2 IMPLANT
GOWN STRL REUS W/TWL LRG LVL3 (GOWN DISPOSABLE) ×4
GOWN STRL REUS W/TWL XL LVL3 (GOWN DISPOSABLE) ×4
GRAFT BNE SPCR VG2 14.5X12X7 (Bone Implant) IMPLANT
GRAFT BNE SPCR VG2 14.5X12X9 (Bone Implant) IMPLANT
HALTER HD/CHIN CERV TRACTION D (MISCELLANEOUS) ×2 IMPLANT
HEMOSTAT SURGICEL 2X14 (HEMOSTASIS) IMPLANT
KIT BASIN OR (CUSTOM PROCEDURE TRAY) ×2 IMPLANT
KIT TURNOVER KIT B (KITS) ×2 IMPLANT
MANIFOLD NEPTUNE II (INSTRUMENTS) IMPLANT
NDL 25GX 5/8IN NON SAFETY (NEEDLE) ×1 IMPLANT
NEEDLE 25GX 5/8IN NON SAFETY (NEEDLE) ×2 IMPLANT
NS IRRIG 1000ML POUR BTL (IV SOLUTION) ×2 IMPLANT
PACK ORTHO CERVICAL (CUSTOM PROCEDURE TRAY) ×2 IMPLANT
PAD ARMBOARD 7.5X6 YLW CONV (MISCELLANEOUS) ×4 IMPLANT
PATTIES SURGICAL .5 X.5 (GAUZE/BANDAGES/DRESSINGS) IMPLANT
PIN TEMP SKYLINE THREADED (PIN) ×1 IMPLANT
PLATE SKYLINE 2 LEVEL 34MM (Plate) ×1 IMPLANT
POSITIONER HEAD DONUT 9IN (MISCELLANEOUS) ×2 IMPLANT
RESTRAINT LIMB HOLDER UNIV (RESTRAINTS) IMPLANT
SCREW VAR SELF TAP SKYLINE 14M (Screw) ×6 IMPLANT
STRIP CLOSURE SKIN 1/2X4 (GAUZE/BANDAGES/DRESSINGS) ×2 IMPLANT
SURGIFLO W/THROMBIN 8M KIT (HEMOSTASIS) ×1 IMPLANT
SUT BONE WAX W31G (SUTURE) ×2 IMPLANT
SUT VIC AB 3-0 PS2 18 (SUTURE) ×1 IMPLANT
SUT VIC AB 3-0 X1 27 (SUTURE) ×2 IMPLANT
SUT VICRYL 4-0 PS2 18IN ABS (SUTURE) ×4 IMPLANT
TOWEL GREEN STERILE (TOWEL DISPOSABLE) ×2 IMPLANT
TOWEL GREEN STERILE FF (TOWEL DISPOSABLE) ×2 IMPLANT
TRAY FOLEY W/BAG SLVR 16FR (SET/KITS/TRAYS/PACK)
TRAY FOLEY W/BAG SLVR 16FR ST (SET/KITS/TRAYS/PACK) IMPLANT

## 2019-05-09 NOTE — Transfer of Care (Signed)
Immediate Anesthesia Transfer of Care Note  Patient: Victor Conner  Procedure(s) Performed: C4-5, C5-6 ANTERIOR CERVICAL DECOMPRESSION/DISCECTOMY FUSION, ALLOGRAFT, PLATE (N/A Spine Cervical)  Patient Location: PACU  Anesthesia Type:General  Level of Consciousness: awake, alert  and oriented  Airway & Oxygen Therapy: Patient Spontanous Breathing  Post-op Assessment: Report given to RN  Post vital signs: Reviewed and stable  Last Vitals:  Vitals Value Taken Time  BP    Temp    Pulse 103 05/09/19 1739  Resp 11 05/09/19 1739  SpO2 91 % 05/09/19 1739  Vitals shown include unvalidated device data.  Last Pain:  Vitals:   05/09/19 1317  TempSrc:   PainSc: 0-No pain      Patients Stated Pain Goal: 5 (08/12/61 4469)  Complications: No apparent anesthesia complications

## 2019-05-09 NOTE — Anesthesia Postprocedure Evaluation (Signed)
Anesthesia Post Note  Patient: DEMICO PLOCH  Procedure(s) Performed: C4-5, C5-6 ANTERIOR CERVICAL DECOMPRESSION/DISCECTOMY FUSION, ALLOGRAFT, PLATE (N/A Spine Cervical)     Patient location during evaluation: PACU Anesthesia Type: General Level of consciousness: awake Pain management: pain level controlled Respiratory status: spontaneous breathing Cardiovascular status: blood pressure returned to baseline Postop Assessment: no apparent nausea or vomiting Anesthetic complications: no    Last Vitals:  Vitals:   05/09/19 1253 05/09/19 1739  BP: (!) 152/79 (!) 137/103  Pulse: 85 (!) 103  Resp: 20 11  Temp: (!) 36.4 C 36.7 C  SpO2: 100% 91%    Last Pain:  Vitals:   05/09/19 1317  TempSrc:   PainSc: 0-No pain                 Kareli Hossain

## 2019-05-09 NOTE — Anesthesia Procedure Notes (Signed)
Procedure Name: Intubation Date/Time: 05/09/2019 3:03 PM Performed by: Leonor Liv, CRNA Pre-anesthesia Checklist: Patient identified, Emergency Drugs available, Suction available and Patient being monitored Patient Re-evaluated:Patient Re-evaluated prior to induction Oxygen Delivery Method: Circle System Utilized Preoxygenation: Pre-oxygenation with 100% oxygen Induction Type: IV induction Ventilation: Mask ventilation without difficulty and Oral airway inserted - appropriate to patient size Laryngoscope Size: Glidescope and 4 Grade View: Grade I Tube type: Oral Tube size: 7.5 mm Number of attempts: 1 Airway Equipment and Method: Stylet and Oral airway Placement Confirmation: ETT inserted through vocal cords under direct vision,  positive ETCO2 and breath sounds checked- equal and bilateral Secured at: 23 cm Tube secured with: Tape Dental Injury: Teeth and Oropharynx as per pre-operative assessment  Difficulty Due To: Difficulty was anticipated and Difficult Airway- due to reduced neck mobility

## 2019-05-09 NOTE — Op Note (Signed)
Preop diagnosis: Severe cervical stenosis C4-5, C5-6.  Postop diagnosis: Same  Procedure C4-5, C5-6 anterior cervical discectomy and fusion, allograft and plate.  Surgeon: Rodell Perna, MD  Assistant: Benjiman Core, PA-C medically necessary and present for the entire procedure  Anesthesia General anesthesia with glide scope intubation due to abnormal cord signal at C4-5 with severe stenosis +7 cc Marcaine skin local.  Implants: Synthes skyline 34 mm plate.  14 mm screws x6.VG-2 grafts cortical cancellus 9 mm graft at C4-5 and 7 mm graft at C5-6.  Procedure after induction of general anesthesia vancomycin center prepped with DuraPrep wrist traction had alter traction without weights gel back behind the shoulder blades next prepped with DuraPrep there is squared with sterile towels sterile skin marker used for marking the incision starting at the midline extending to the left and a prominent skin fold.  Levels based on palpable landmarks.  Betadine Steri-Drape sterile male standard the head thyroid she and drapes timeout procedure completed  Incision was made starting at the midline platysma divided in line with the fibers some prominent veins were coagulated with the bipolar cautery.  Blunt dissection above the omohyoid that was obliquely traversing the field down the longus Coley with prominent spur noted and medially the see 4-5 level was marked with a short 25 needle straight clamp and sterilely draped C arm.  This is the level that had severe stenosis with narrowing down to 4 to 5 mm diameter canal from anterior to posterior.  Disc was excised spurs were removed there are prominent spurs that extended across C5 all the way down to the C56 disc space.  Spurs removed discectomy was performed operative microscope was draped brought in and we progressed back to the posterior longitudinal ligament removing spurs overhanging spurs and a large amount of disc material that was retained by the ligament and the  posterior longitudinal ligament was completely taken down decompressing the dura to make sure there was no extruded fragments since patient had abnormal cord signal at this level.  We took a fairly wide excision of bone more caudad than cephalad due to that area being the tightest and it was sized for a 9 mm graft.  9 mm graft was marked in the midline some Surgi-Flo was used for some epidural bleeding was dry at the time of graft insertion countersunk 2 mm.  Identical procedure was repeated at the C5-6 level.  At this level there was narrowing only to 6 or 7 mm it was not as tight as the C4-5 level.  Again posterior longitudinal ligament was taken down with the operative microscope with 1 and 2 mm Kerrisons removing all spurs leaving room on each side of the graft for egress of fluid and there was more narrowing on the opposite side at the C5-6 level versus the C4-5 which had significant flattening of the cord.  Graft size for this level was a 7 mm.  Traction was held by the CRNA as a graft was inserted and care was taken not to over distract and put 2 total graft in.  Graft was snug.  Also is a been removed decompressing the dura which was bulging out from the floor with visualization with operative microscope.  The plate was selected initially plate was little short within switched from the 32-34 which is appropriate level held with the prong fixed with a single screw checked under fluoroscopy AP lateral adjusted and then all screws were filled.  Final spot AP lateral x-rays were taken which  was confirmed hardware graft and plate and screws were all in good position the tiny screwdriver was used to lock all sick screws down securely and out technique for Hemovac drain copious irrigation operative field dry 3 oh platysma closure 4-0 Vicryl subcuticular closure tincture benzoin Steri-Strips 4 x 4's tape and a soft collar patient tolerated the procedure well was transferred recovery room in stable condition.

## 2019-05-09 NOTE — Anesthesia Preprocedure Evaluation (Addendum)
Anesthesia Evaluation  Patient identified by MRN, date of birth, ID band Patient awake    Reviewed: Allergy & Precautions, NPO status , Patient's Chart, lab work & pertinent test results  History of Anesthesia Complications Negative for: history of anesthetic complications  Airway Mallampati: II  TM Distance: >3 FB Neck ROM: Full    Dental  (+) Teeth Intact   Pulmonary neg pulmonary ROS,    Pulmonary exam normal        Cardiovascular hypertension, Pt. on medications Normal cardiovascular exam     Neuro/Psych negative psych ROS   GI/Hepatic negative GI ROS, Neg liver ROS,   Endo/Other  negative endocrine ROS  Renal/GU negative Renal ROS  negative genitourinary   Musculoskeletal negative musculoskeletal ROS (+)   Abdominal   Peds  Hematology negative hematology ROS (+)   Anesthesia Other Findings HLD  Reproductive/Obstetrics                           Anesthesia Physical Anesthesia Plan  ASA: II  Anesthesia Plan: General   Post-op Pain Management:    Induction: Intravenous  PONV Risk Score and Plan: Ondansetron, Dexamethasone, Treatment may vary due to age or medical condition and Midazolam  Airway Management Planned: Oral ETT and Video Laryngoscope Planned  Additional Equipment: None  Intra-op Plan:   Post-operative Plan: Extubation in OR  Informed Consent: I have reviewed the patients History and Physical, chart, labs and discussed the procedure including the risks, benefits and alternatives for the proposed anesthesia with the patient or authorized representative who has indicated his/her understanding and acceptance.     Dental advisory given  Plan Discussed with:   Anesthesia Plan Comments:        Anesthesia Quick Evaluation

## 2019-05-09 NOTE — Progress Notes (Signed)
Orthopedic Tech Progress Note Patient Details:  Victor Conner 01/03/1966 [id number]  Ortho Devices Type of Ortho Device: Soft collar   Post Interventions Patient Tolerated: Well Instructions Provided: Care of device   [name] 05/09/2019, 6:51 PM

## 2019-05-09 NOTE — Interval H&P Note (Signed)
History and Physical Interval Note:  05/09/2019 2:41 PM  Victor Conner  has presented today for surgery, with the diagnosis of c4-5, c5-6 stenosis, myeolopathy.  The various methods of treatment have been discussed with the patient and family. After consideration of risks, benefits and other options for treatment, the patient has consented to  Procedure(s): C4-5, C5-6 ANTERIOR CERVICAL DECOMPRESSION/DISCECTOMY FUSION, ALLOGRAFT, PLATE (N/A) as a surgical intervention.  The patient's history has been reviewed, patient examined, no change in status, stable for surgery.  I have reviewed the patient's chart and labs.  Questions were answered to the patient's satisfaction.     Marybelle Killings

## 2019-05-10 DIAGNOSIS — M4802 Spinal stenosis, cervical region: Secondary | ICD-10-CM

## 2019-05-10 MED ORDER — HYDROCODONE-ACETAMINOPHEN 5-325 MG PO TABS: 1.0000 | ORAL_TABLET | Freq: Four times a day (QID) | ORAL | 0 refills | Status: DC | PRN

## 2019-05-10 MED ORDER — HYDROCODONE-ACETAMINOPHEN 5-325 MG PO TABS: 1.0000 | ORAL_TABLET | ORAL | 0 refills | Status: DC | PRN

## 2019-05-10 NOTE — Discharge Instructions (Signed)
Walk daily.  Avoid lifting.  You be more comfortable and have less neck swelling please sleep in a recliner or beachchair position.  Use extra collar wrapped in Saran wrap when you take a shower.  See Dr. [name] in 1 week.  Your pain medication has been sent to your pharmacy.  You do not need to change her dressing.

## 2019-05-10 NOTE — Evaluation (Addendum)
Occupational Therapy Evaluation Patient Details Name: Victor Conner MRN: [id number] DOB: 07/15/1966 Today's Date: 05/10/2019    History of Present Illness Pt is a 53 y/o male now s/p ACDF C4-6. PMHx includes HTN   Clinical Impression   This 53 y/o male presents with the above. PTA pt was independent with ADL, iADL and functional mobility, was working. Pt presenting with generalized weakness, decreased sensation in bil UEs, and mild balance deficits impacting his functional performance. Pt demonstrating functional mobility without AD as well as with SPC and overall minguard assist - improved stability noted using DME and discussion held on use of AD initially after return home with pt in agreement. Pt completing LB ADL with minguard assist, seated UB ADL with setup assist. Further reviewed cervical precautions, brace management, safety and compensatory techniques for completing ADL and functional transfers. Pt reports has good family support at home from spouse and children who are able to assist with ADL/iADL PRN. Will continue to follow while he remains in acute setting to maximize his safety and independence with ADL and mobility.     Follow Up Recommendations  No OT follow up;Supervision/Assistance - 24 hour    Equipment Recommendations  None recommended by OT(discussed options for obtaining SPC)           Precautions / Restrictions Precautions Precautions: Cervical Precaution Booklet Issued: Yes (comment) Precaution Comments: issued and reviewed during session Required Braces or Orthoses: Cervical Brace Cervical Brace: Soft collar;At all times Restrictions Weight Bearing Restrictions: No      Mobility Bed Mobility Overal bed mobility: Needs Assistance Bed Mobility: Rolling;Sidelying to Sit Rolling: Supervision Sidelying to sit: Supervision       General bed mobility comments: for safety, cues and education provided of log roll technique  Transfers Overall transfer  level: Needs assistance Equipment used: None;Straight cane Transfers: Sit to/from Stand Sit to Stand: Supervision         General transfer comment: for safety and balance, no physical assist required    Balance Overall balance assessment: Mild deficits observed, not formally tested                                         ADL either performed or assessed with clinical judgement   ADL Overall ADL's : Needs assistance/impaired Eating/Feeding: Modified independent;Sitting   Grooming: Supervision/safety;Standing   Upper Body Bathing: Set up;Min guard;Sitting Upper Body Bathing Details (indicate cue type and reason): pt reports he has shower collar for ADL task Lower Body Bathing: Min guard;Sit to/from stand Lower Body Bathing Details (indicate cue type and reason): educated to perform bathing task from sit<>stand level (vs standing alone) for increased safety and for adherence to precautions  Upper Body Dressing : Set up;Sitting   Lower Body Dressing: Min guard;Sit to/from stand Lower Body Dressing Details (indicate cue type and reason): pt able to utilize figure 4 technique for ADL task; donning underwear, pants, socks and shoes Toilet Transfer: Min guard;Ambulation Toilet Transfer Details (indicate cue type and reason): simulated via transfer to/from EOB; pt ambulating without AD and then trialling with SPC, increased stability noted with [facility] Toileting- Clothing Manipulation and Hygiene: Min guard;Sit to/from stand       Functional mobility during ADLs: Min guard;Cane General ADL Comments: pt with generalized/post-op weakness; mildly unsteady so trialled [facility] for mobility with improvements noted - educated pt on where to obtain [facility], pt also reports  he has walking stick he can utilize                          Pertinent Vitals/Pain Pain Assessment: Faces Faces Pain Scale: Hurts a little bit Pain Location: incisional Pain Descriptors / Indicators:  Discomfort Pain Intervention(s): Monitored during session;Limited activity within patient's tolerance     Hand Dominance Left   Extremity/Trunk Assessment Upper Extremity Assessment Upper Extremity Assessment: RUE deficits/detail;LUE deficits/detail RUE Deficits / Details: pt reports numbness/tingling from digits up to shoulder, reports he typically experiences weakness and decreased fine motor with increased fatigue/as day progresses  RUE Sensation: decreased light touch RUE Coordination: decreased fine motor LUE Deficits / Details: pt reports numbness/tingling from digits up to shoulder, reports he typically experiences weakness and decreased fine motor with increased fatigue/as day progresses  LUE Sensation: decreased light touch LUE Coordination: decreased fine motor   Lower Extremity Assessment Lower Extremity Assessment: Generalized weakness   Cervical / Trunk Assessment Cervical / Trunk Assessment: Other exceptions Cervical / Trunk Exceptions: s/p cervical surgery   Communication Communication Communication: No difficulties   Cognition Arousal/Alertness: Awake/alert Behavior During Therapy: WFL for tasks assessed/performed Overall Cognitive Status: Within Functional Limits for tasks assessed                                     General Comments       Exercises Exercises: Other exercises Other Exercises Other Exercises: issued fine motor/coordination HEP and reviewed with pt Other Exercises: issued red theraputty and reviewed theraputty HEP for increased fine motor strength   Shoulder Instructions      Home Living Family/patient expects to be discharged to:: Private residence Living Arrangements: Spouse/significant other;Children Available Help at Discharge: Family;Available 24 hours/day Type of Home: House Home Access: Stairs to enter [employer] of Steps: 1, or 3`   Home Layout: One level     Bathroom Shower/Tub: Walk-in  shower;Tub/shower unit   [employer]: Standard     Home Equipment: Civil engineer, contracting;Other (comment)(walking stick)   Additional Comments:        Prior Functioning/Environment Level of Independence: Independent        Comments: working, IT trainer work        OT Problem List: Decreased strength;Decreased range of motion;Impaired balance (sitting and/or standing);Decreased coordination;Decreased knowledge of use of DME or AE;Decreased knowledge of precautions;Impaired UE functional use;Impaired sensation      OT Treatment/Interventions: Self-care/ADL training;Therapeutic exercise;Energy conservation;DME and/or AE instruction;Therapeutic activities;Patient/family education;Balance training    OT Goals(Current goals can be found in the care plan section) Acute Rehab OT Goals Patient Stated Goal: regain his strength in his arms and legs OT Goal Formulation: With patient Time For Goal Achievement: 05/24/19 Potential to Achieve Goals: Good  OT Frequency: Min 2X/week   Barriers to D/C:            Co-evaluation              AM-PAC OT "6 Clicks" Daily Activity     Outcome Measure Help from another person eating meals?: None Help from another person taking care of personal grooming?: None Help from another person toileting, which includes using toliet, bedpan, or urinal?: A Little Help from another person bathing (including washing, rinsing, drying)?: A Little Help from another person to put on and taking off regular upper body clothing?: None Help from another person to put on and taking  off regular lower body clothing?: A Little 6 Click Score: 21   End of Session Equipment Utilized During Treatment: Cervical collar;Other (comment)(SPC) Nurse Communication: Mobility status  Activity Tolerance: Patient tolerated treatment well Patient left: in bed;with call bell/phone within reach  OT Visit Diagnosis: Other abnormalities of gait and mobility (R26.89);Muscle weakness  (generalized) (M62.81)                Time: 7473-4037 OT Time Calculation (min): 33 min Charges:  OT General Charges $OT Visit: 1 Visit OT Evaluation $OT Eval Low Complexity: 1 Low OT Treatments $Self Care/Home Management : 8-22 mins  Lou Cal, OT Supplemental Rehabilitation Services Pager [phone] Office [place] 05/10/2019, 9:22 AM

## 2019-05-10 NOTE — Plan of Care (Addendum)
Pt given D/C instructions with verbal understanding. Rx's were sent to pharmacy by MD. Pt's incision is clean and dry with no sign of infection. Pt's IV and Hemovac were removed prior to D/C. Pt D/C'd home via wheelchair per MD order. Pt has extra soft collar for shower at D/C. Pt is stable @ D/C and has no other needs at this time. Holli Humbles, RN

## 2019-05-12 ENCOUNTER — Encounter (HOSPITAL_COMMUNITY): Payer: Self-pay | Admitting: Orthopaedic Surgery

## 2019-05-13 NOTE — Discharge Summary (Signed)
Patient ID: Victor Conner MRN: [id number] DOB/AGE: [birth year] 53 y.o.  Admit date: 05/09/2019 Discharge date: 05/13/2019  Admission Diagnoses:  Active Problems:   Cervical stenosis of spine   Discharge Diagnoses:  Active Problems:   Cervical stenosis of spine  status post Procedure(s): C4-5, C5-6 ANTERIOR CERVICAL DECOMPRESSION/DISCECTOMY FUSION, ALLOGRAFT, PLATE  Past Medical History:  Diagnosis Date  . Hypercholesteremia   . Hypertension     Surgeries: Procedure(s): C4-5, C5-6 ANTERIOR CERVICAL DECOMPRESSION/DISCECTOMY FUSION, ALLOGRAFT, PLATE on [id number]   Consultants:   Discharged Condition: Improved  Hospital Course: Victor Conner is an 53 y.o. male who was admitted 05/09/2019 for operative treatment of cervical stenosis/HNP. Patient failed conservative treatments (please see the history and physical for the specifics) and had severe unremitting pain that affects sleep, daily activities and work/hobbies. After pre-op clearance, the patient was taken to the operating room on 05/09/2019 and underwent  Procedure(s): C4-5, C5-6 ANTERIOR CERVICAL DECOMPRESSION/DISCECTOMY FUSION, ALLOGRAFT, PLATE.    Patient was given perioperative antibiotics:  Anti-infectives (From admission, onward)   Start     Dose/Rate Route Frequency Ordered Stop   05/09/19 2200  ceFAZolin (ANCEF) IVPB 1 g/50 mL premix     1 g 100 mL/hr over 30 Minutes Intravenous Every 8 hours 05/09/19 1843 05/10/19 0548   05/09/19 1300  vancomycin (VANCOCIN) 1,500 mg in sodium chloride 0.9 % 500 mL IVPB     1,500 mg 250 mL/hr over 120 Minutes Intravenous To [facility] 05/09/19 0627 05/09/19 1848       Patient was given sequential compression devices and early ambulation to prevent DVT.   Patient benefited maximally from hospital stay and there were no complications. At the time of discharge, the patient was urinating/moving their bowels without difficulty, tolerating a regular diet, pain is  controlled with oral pain medications and they have been cleared by PT/OT.   Recent vital signs: No data found.   Recent laboratory studies: No results for input(s): WBC, HGB, HCT, PLT, NA, K, CL, CO2, BUN, CREATININE, GLUCOSE, INR, CALCIUM in the last 72 hours.  Invalid input(s): PT, 2   Discharge Medications:   Allergies as of 05/10/2019      Reactions   Morphine And Related Itching      Medication List    TAKE these medications   amLODipine 10 MG tablet Commonly known as: NORVASC Take 5 mg by mouth daily.   HYDROcodone-acetaminophen 5-325 MG tablet Commonly known as: Norco Take 1-2 tablets by mouth every 6 (six) hours as needed for moderate pain. Post op pain   lisinopril 20 MG tablet Commonly known as: ZESTRIL Take 20 mg by mouth every morning.   multivitamin with minerals tablet Take 1 tablet by mouth daily.   simvastatin 40 MG tablet Commonly known as: ZOCOR Take 40 mg by mouth at bedtime.   Vitamin D-3 125 MCG (5000 UT) Tabs Take 5,000 Units by mouth daily.       Diagnostic Studies: Dg Cervical Spine 2-3 Views  Addendum Date: 05/09/2019   ADDENDUM REPORT: 05/09/2019 17:33 ADDENDUM: Per discussion with the staff in operating room, there is question of retained suture needle. No suture needle is identified on the 2 images provided. Electronically Signed   By: Nolon Nations M.D.   On: 05/09/2019 17:33   Result Date: 05/09/2019 CLINICAL DATA:  C4-C6 ACDF EXAM: CERVICAL SPINE - 2-3 VIEW; DG C-ARM 61-120 MIN COMPARISON:  05/04/2019 FINDINGS: Patient has undergone anterior fusion of C4-C6. Alignment is unchanged. IMPRESSION: Status post anterior  fusion C4-C6. Electronically Signed: By: Nolon Nations M.D. On: 05/09/2019 17:28   Mr Cervical Spine Wo Contrast  Result Date: 05/02/2019 CLINICAL DATA:  Neck pain radiating down both arms over the last 3 weeks. EXAM: MRI CERVICAL SPINE WITHOUT CONTRAST TECHNIQUE: Multiplanar, multisequence MR imaging of the cervical  spine was performed. No intravenous contrast was administered. COMPARISON:  None. FINDINGS: Alignment: Straightening of the normal cervical lordosis. Vertebrae: No fracture or primary bone lesion. Cord: No primary cord lesion. See below regarding stenosis and cord compromise at the C4-5 level. Posterior Fossa, vertebral arteries, paraspinal tissues: Negative Disc levels: No abnormality at the foramen magnum, C1-2 or C2-3. C3-4: Spondylosis with endplate osteophytes and bulging of the disc. Narrowing of the subarachnoid space but no cord compression. AP diameter of the canal in the midline is 8 mm. Mild bilateral foraminal stenosis. C4-5: Spondylosis with endplate osteophytes and disc herniation. Severe spinal stenosis with AP diameter of the canal in the midline only 4 to 5 mm. Effacement of the subarachnoid space and flattening the cord. Abnormal T2 signal in the cord at this level. Bilateral foraminal stenosis could compress the C5 nerves. C5-6: Spondylosis with endplate osteophytes and bulging disc material more prominent on the left. AP diameter of the canal in the midline 6.5 mm. Effacement of the subarachnoid space with flattening of the left side of the cord. Foraminal stenosis left worse than right that could compress the C6 nerves, particularly the left. C6-7: Spondylosis with endplate osteophytes and bulging disc material more prominent towards the left. AP diameter of the canal in the midline is 6.5-7 mm. Mild deformity of the left side of the cord. Foraminal stenosis left worse than right could affect either C7 nerve, particularly the left. C7-T1: Mild bulging of the disc. Mild facet osteoarthritis. No compressive canal or foraminal stenosis. IMPRESSION: C4-5: Severe spinal stenosis due to endplate osteophytes and broad-based disc herniation. Canal stenosis with AP diameter in the midline only 4-5 mm. The subarachnoid space is effaced and the cord is flattened with abnormal T2 signal within the cord.  Bilateral foraminal stenosis at this level could additionally compress the C5 nerves. Consideration of surgical decompression at least at this level seems prudent. C3-4: Spondylosis with mild canal narrowing, AP diameter 8 mm, non-compressive. Bilateral foraminal narrowing could affect the C4 nerves. C5-6 and C6-7 show endplate osteophytes and left-sided disc bulges with AP diameter of the canal narrowed in the range of 6-7 mm, worse on the left. Some cord deformity on the left at both of these levels. Foraminal narrowing left more than right at both of these levels could cause nerve compression, particularly on the left. Electronically Signed   By: Nelson Chimes M.D.   On: 05/02/2019 14:14   Dg C-arm 1-60 Min  Addendum Date: 05/09/2019   ADDENDUM REPORT: 05/09/2019 17:33 ADDENDUM: Per discussion with the staff in operating room, there is question of retained suture needle. No suture needle is identified on the 2 images provided. Electronically Signed   By: Nolon Nations M.D.   On: 05/09/2019 17:33   Result Date: 05/09/2019 CLINICAL DATA:  C4-C6 ACDF EXAM: CERVICAL SPINE - 2-3 VIEW; DG C-ARM 61-120 MIN COMPARISON:  05/04/2019 FINDINGS: Patient has undergone anterior fusion of C4-C6. Alignment is unchanged. IMPRESSION: Status post anterior fusion C4-C6. Electronically Signed: By: Nolon Nations M.D. On: 05/09/2019 17:28   Xr Cervical Spine 2 Or 3 Views  Result Date: 05/04/2019 AP lateral cervical spine x-rays are obtained and reviewed this shows normal cervical  curvature.  There is disc space narrowing at C4-5, C5-6 and C6-7 with some endplate spurring. Impression: Mid cervical spondylosis as described above.     Follow-up Information    Marybelle Killings, MD Follow up in 1 week(s).   Specialty: Orthopedic Surgery Contact information: [place] [address] [phone]           Discharge Plan:  discharge to  home  Disposition:     Signed: Benjiman Core   05/13/2019, 3:23 PM

## 2019-05-18 ENCOUNTER — Encounter: Payer: Self-pay | Admitting: Orthopaedic Surgery

## 2019-05-18 ENCOUNTER — Ambulatory Visit (INDEPENDENT_AMBULATORY_CARE_PROVIDER_SITE_OTHER): Payer: BLUE CROSS/BLUE SHIELD

## 2019-05-18 ENCOUNTER — Ambulatory Visit (INDEPENDENT_AMBULATORY_CARE_PROVIDER_SITE_OTHER): Payer: BLUE CROSS/BLUE SHIELD | Admitting: Orthopaedic Surgery

## 2019-05-18 VITALS — BP 144/89 | HR 74 | Ht 75.0 in | Wt 278.0 lb

## 2019-05-18 DIAGNOSIS — M4712 Other spondylosis with myelopathy, cervical region: Secondary | ICD-10-CM

## 2019-05-18 DIAGNOSIS — Z981 Arthrodesis status: Secondary | ICD-10-CM | POA: Diagnosis not present

## 2019-05-18 MED ORDER — HYDROCODONE-ACETAMINOPHEN 5-325 MG PO TABS: 1.0000 | ORAL_TABLET | Freq: Four times a day (QID) | ORAL | 0 refills | Status: DC | PRN

## 2019-05-18 NOTE — Progress Notes (Signed)
   Post-Op Visit Note   Patient: Victor Conner           Date of Birth: 05-07-1966           MRN: [id number] Visit Date: 05/18/2019 PCP: Lucia Gaskins, MD   Assessment & Plan: Postop two-level cervical fusion C4-5, C5-6 with preoperative myelomalacia of the cord and myelopathic gait with arm weakness.  He is noticed improvement in the tingling in his arms.  He still having a little bit of unsteadiness in his gait on recommend he gradually work on ambulation using a cane or an umbrella to decrease fall risk.  Steri-Strips changed recheck 5 weeks with lateral flexion-extension x-rays out of collar.  He has been taking L1 bike and night developed him sleep final 20 tablets sent in.  Gradual increase walking he can resume driving as long as he got his collar on.  No lifting.  Chief Complaint:  Chief Complaint  Patient presents with  . Neck - Routine Post Op    05/09/2019 C4-5, C5-6 ACDF, Allograft, Plate   Visit Diagnoses:  1. Status post cervical spinal fusion   2. Other spondylosis with myelopathy, cervical region   3. S/P cervical spinal fusion     Plan: Return 5 weeks to obtain lateral flexion-extension C-spine x-ray to check for motion if any motions present he will continue in the collar.  Patient self-employed.  To gradually work on lower extremity walking.  Follow-Up Instructions: Return in about 5 weeks (around 06/22/2019), or to see Benjiman Core PA-C.   Orders:  Orders Placed This Encounter  Procedures  . XR Cervical Spine 2 or 3 views   Meds ordered this encounter  Medications  . HYDROcodone-acetaminophen (NORCO/VICODIN) 5-325 MG tablet    Sig: Take 1 tablet by mouth every 6 (six) hours as needed for moderate pain.    Dispense:  20 tablet    Refill:  0    Imaging: Xr Cervical Spine 2 Or 3 Views  Result Date: 05/18/2019 2 view x-rays cervical spine AP and lateral demonstrate C4-5 C5-6 anterior fusion with plate good position of graft and plates. Impression:  Satisfactory two-level cervical fusion C4-5, C5-6.   PMFS History: Patient Active Problem List   Diagnosis Date Noted  . Other spondylosis with myelopathy, cervical region 05/18/2019  . S/P cervical spinal fusion 05/18/2019  . Cervical stenosis of spine 05/09/2019  . Spinal stenosis of cervical region 05/04/2019   Past Medical History:  Diagnosis Date  . Hypercholesteremia   . Hypertension     No family history on file.  Past Surgical History:  Procedure Laterality Date  . ANTERIOR CERVICAL DECOMP/DISCECTOMY FUSION N/A 05/09/2019   Procedure: C4-5, C5-6 ANTERIOR CERVICAL DECOMPRESSION/DISCECTOMY FUSION, ALLOGRAFT, PLATE;  Surgeon: Marybelle Killings, MD;  Location: [facility];  Service: Orthopedics;  Laterality: N/A;  . BONE CYST EXCISION     left leg  . INCISION AND DRAINAGE     hand   Social History   Occupational History  . Not on file  Tobacco Use  . Smoking status: Never Smoker  . Smokeless tobacco: Never Used  Substance and Sexual Activity  . Alcohol use: Yes    Comment: rare beer  . Drug use: Never  . Sexual activity: Not on file

## 2019-06-08 ENCOUNTER — Telehealth: Payer: Self-pay | Admitting: Radiology

## 2019-06-08 NOTE — Telephone Encounter (Signed)
I called patient and discussed.  He still notices gait problems in his legs from the myelopathy with cord compression that he had preoperatively.  He understands he had some cord myelomalacia and this will take some time and may improve some or he may always have problems.  Encouraged him to use a cane or umbrella when he walks.  I discussed with him that when he follows up in October that he will be in the collar until we are sure that the fusion is solid.  He understands if there is any question the fusion not solid he may have to be in the collar for an extra month.  He can follow-up with me in November.

## 2019-06-08 NOTE — Telephone Encounter (Signed)
Patient continues to have pain in his legs and problems with his balance. He states that it is like he can feel every nerve in his legs to the point if he touches his legs he wants to jump from the sensitivity. He is concerned that something may be going on. He also continues to have lower back pain.  Can you advise whether this is still part of his healing process or if there is something else you would like for him to do?

## 2019-06-08 NOTE — Telephone Encounter (Signed)
Noted  

## 2019-06-22 ENCOUNTER — Ambulatory Visit: Payer: Self-pay

## 2019-06-22 ENCOUNTER — Encounter: Payer: Self-pay | Admitting: Surgery

## 2019-06-22 ENCOUNTER — Ambulatory Visit (INDEPENDENT_AMBULATORY_CARE_PROVIDER_SITE_OTHER): Payer: BLUE CROSS/BLUE SHIELD | Admitting: Surgery

## 2019-06-22 VITALS — BP 163/95 | HR 85 | Ht 75.0 in | Wt 278.0 lb

## 2019-06-22 DIAGNOSIS — Z981 Arthrodesis status: Secondary | ICD-10-CM

## 2019-06-22 NOTE — Progress Notes (Signed)
53 year old white male who is about 6 weeks status post C4-5 and C5-6 ACDF for cervical stenosis/myelopathy returns.  States that he continues to have some numbness and tingling in both of his hands.  He feels like his gait is slightly improving but still feels unsteady with a cane.  Exam Patient ambulating well with single prong cane.  Plan X-rays reviewed with patient today.  Hardware intact.  Obviously fusion is not solid.  Recommend that he continue collar for 2 more weeks and then follow-up with me for recheck.  Still no driving, lifting, pushing, pulling.

## 2019-06-23 ENCOUNTER — Ambulatory Visit: Payer: BLUE CROSS/BLUE SHIELD | Admitting: Surgery

## 2019-07-06 ENCOUNTER — Other Ambulatory Visit: Payer: Self-pay

## 2019-07-06 ENCOUNTER — Ambulatory Visit (INDEPENDENT_AMBULATORY_CARE_PROVIDER_SITE_OTHER): Payer: BLUE CROSS/BLUE SHIELD | Admitting: Surgery

## 2019-07-06 DIAGNOSIS — Z981 Arthrodesis status: Secondary | ICD-10-CM

## 2019-07-06 NOTE — Progress Notes (Signed)
53 year old white male returns for recheck.  States that his neck continues to improve.  He also feels like he is making progress with his strength.  I advised him that he can discontinue the cervical collar.  Still no aggressive activity.  He asked about bow hunting and I told him to hold off on doing this.  We will follow-up with Dr. [name] in 4 weeks for recheck and then he can discuss return back to regular activities at that point.  All questions answered.

## 2019-08-03 ENCOUNTER — Other Ambulatory Visit: Payer: Self-pay

## 2019-08-03 ENCOUNTER — Ambulatory Visit: Payer: BLUE CROSS/BLUE SHIELD | Admitting: Surgery

## 2019-08-03 ENCOUNTER — Encounter: Payer: Self-pay | Admitting: Surgery

## 2019-08-03 ENCOUNTER — Ambulatory Visit: Payer: BLUE CROSS/BLUE SHIELD

## 2019-08-03 VITALS — Ht 75.0 in | Wt 278.0 lb

## 2019-08-03 DIAGNOSIS — M1712 Unilateral primary osteoarthritis, left knee: Secondary | ICD-10-CM | POA: Diagnosis not present

## 2019-08-03 DIAGNOSIS — M25562 Pain in left knee: Secondary | ICD-10-CM | POA: Diagnosis not present

## 2019-08-03 DIAGNOSIS — G8929 Other chronic pain: Secondary | ICD-10-CM

## 2019-08-03 DIAGNOSIS — Z981 Arthrodesis status: Secondary | ICD-10-CM

## 2019-08-03 MED ORDER — BUPIVACAINE HCL 0.25 % IJ SOLN
6.0000 mL | INTRAMUSCULAR | Status: AC | PRN
  Administered 2019-08-03: 6 mL via INTRA_ARTICULAR

## 2019-08-03 MED ORDER — METHYLPREDNISOLONE ACETATE 40 MG/ML IJ SUSP
80.0000 mg | INTRAMUSCULAR | Status: AC | PRN
  Administered 2019-08-03: 80 mg via INTRA_ARTICULAR

## 2019-08-03 MED ORDER — LIDOCAINE HCL 1 % IJ SOLN
3.0000 mL | INTRAMUSCULAR | Status: AC | PRN
  Administered 2019-08-03: 3 mL

## 2019-08-03 NOTE — Progress Notes (Signed)
Office Visit Note   Patient: Victor Conner           Date of Birth: 1965/12/07           MRN: [id number] Visit Date: 08/03/2019              Requested by: Lucia Gaskins, MD [address] [place],  [place] [address] PCP: Lucia Gaskins, MD   Assessment & Plan: Visit Diagnoses:  1. Chronic pain of left knee   2. Unilateral primary osteoarthritis, left knee   3. Status post cervical spinal fusion     Plan: Patient is almost 12 weeks status post cervical fusion.  I will have him see Dr. [name] today to discuss activities that he can return back to doing.  Patient is wanting to do some hunting.  Regards to his left knee.  I offered conservative treatment with injection.  Reviewed x-rays today with him.  He does have bone-on-bone medial compartment.  He tolerated injection well without complication.  We will schedule return office visit with Dr. Ninfa Linden to discuss further treatment options including viscosupplementation versus definitive treatment with total knee replacement.  All questions answered.  Follow-Up Instructions: Return in about 4 weeks (around 08/31/2019) for With Dr. Ninfa Linden to discuss possible left knee replacement.   Orders:  Orders Placed This Encounter  Procedures  . Large Joint Inj  . XR KNEE 3 VIEW LEFT   No orders of the defined types were placed in this encounter.     Procedures: Large Joint Inj: L knee on 08/03/2019 10:08 AM Indications: pain Details: 25 G 1.5 in needle, anteromedial approach Medications: 3 mL lidocaine 1 %; 6 mL bupivacaine 0.25 %; 80 mg methylPREDNISolone acetate 40 MG/ML Outcome: tolerated well, no immediate complications Consent was given by the patient. Patient was prepped and draped in the usual sterile fashion.       Clinical Data: No additional findings.   Subjective: Chief Complaint  Patient presents with  . Neck - Routine Post Op    05/09/2019 Cervical Spine Fusion    HPI 53 year old white male returns.   He is status post cervical fusion May 09, 2019.  States that he continues to have some occasional neck soreness.  Numbness and tingling in both hands.  Patient also complaining of left knee pain that we discussed last office visit.  He has no history of end-stage DJD left knee and was seen by Benita Stabile, PA June 2019.  He said he continues to have ongoing knee pain with all activity.  He tries to push through things to be active although he feels like the knee has gotten worse. Review of Systems No current cardiac pulmonary GI GU issues.  Patient denies history of diabetes.  Objective: Vital Signs: Ht 6\' 3"  (1.905 m)   Wt 278 lb (126.1 kg)   BMI 34.75 kg/m   Physical Exam HENT:     Head: Normocephalic and atraumatic.  Eyes:     Extraocular Movements: Extraocular movements intact.     Pupils: Pupils are equal, round, and reactive to light.  Pulmonary:     Effort: No respiratory distress.  Musculoskeletal:     Comments: Gait is somewhat antalgic.  Left knee good range of motion.  Some swelling without large effusion.  Joint line tender.  Positive crepitus.  Skin:    General: Skin is warm and dry.  Neurological:     Mental Status: He is alert.     Ortho Exam  Specialty  Comments:  No specialty comments available.  Imaging: No results found.   PMFS History: Patient Active Problem List   Diagnosis Date Noted  . Other spondylosis with myelopathy, cervical region 05/18/2019  . S/P cervical spinal fusion 05/18/2019  . Cervical stenosis of spine 05/09/2019  . Spinal stenosis of cervical region 05/04/2019   Past Medical History:  Diagnosis Date  . Hypercholesteremia   . Hypertension     History reviewed. No pertinent family history.  Past Surgical History:  Procedure Laterality Date  . ANTERIOR CERVICAL DECOMP/DISCECTOMY FUSION N/A 05/09/2019   Procedure: C4-5, C5-6 ANTERIOR CERVICAL DECOMPRESSION/DISCECTOMY FUSION, ALLOGRAFT, PLATE;  Surgeon: Marybelle Killings, MD;  Location:  [facility];  Service: Orthopedics;  Laterality: N/A;  . BONE CYST EXCISION     left leg  . INCISION AND DRAINAGE     hand   Social History   Occupational History  . Not on file  Tobacco Use  . Smoking status: Never Smoker  . Smokeless tobacco: Never Used  Substance and Sexual Activity  . Alcohol use: Yes    Comment: rare beer  . Drug use: Never  . Sexual activity: Not on file

## 2019-08-31 ENCOUNTER — Telehealth: Payer: Self-pay

## 2019-08-31 ENCOUNTER — Other Ambulatory Visit: Payer: Self-pay

## 2019-08-31 ENCOUNTER — Ambulatory Visit: Payer: BLUE CROSS/BLUE SHIELD | Admitting: Orthopaedic Surgery

## 2019-08-31 ENCOUNTER — Encounter: Payer: Self-pay | Admitting: Orthopaedic Surgery

## 2019-08-31 VITALS — Ht 75.0 in | Wt 278.0 lb

## 2019-08-31 DIAGNOSIS — G8929 Other chronic pain: Secondary | ICD-10-CM | POA: Diagnosis not present

## 2019-08-31 DIAGNOSIS — M1712 Unilateral primary osteoarthritis, left knee: Secondary | ICD-10-CM

## 2019-08-31 DIAGNOSIS — M25562 Pain in left knee: Secondary | ICD-10-CM

## 2019-08-31 NOTE — Telephone Encounter (Signed)
Noted  

## 2019-08-31 NOTE — Telephone Encounter (Signed)
Left knee gel injection ?

## 2019-08-31 NOTE — Progress Notes (Signed)
Office Visit Note   Patient: Victor Conner           Date of Birth: May 03, 1966           MRN: [id number] Visit Date: 08/31/2019              Requested by: Lucia Gaskins, MD [address] [place],  [place] [address] PCP: Lucia Gaskins, MD   Assessment & Plan: Visit Diagnoses:  1. Chronic pain of left knee   2. Unilateral primary osteoarthritis, left knee     Plan: The patient does have severe arthritis in the understands this.  This involves his left knee.  He would like to try to stay conservative as possible over the next 6 months to a year.  I agree with this treatment plan.  I did talk to him about quad strengthening exercises and activity modification as well as weight loss.  I do feel that he is a candidate for trying at least a one-time hyaluronic acid injection in the left knee.  I gave him a handout about this and explained the rationale behind this type of injection.  He is agreeable to this.  We will work on ordering it and hopefully seeing him in 4 weeks to place hyaluronic acid injection into the left knee.  All question concerns were answered and addressed.  Follow-Up Instructions: Return in about 4 weeks (around 09/28/2019).   Orders:  No orders of the defined types were placed in this encounter.  No orders of the defined types were placed in this encounter.     Procedures: No procedures performed   Clinical Data: No additional findings.   Subjective: Chief Complaint  Patient presents with  . Left Knee - Pain  Patient is a very pleasant 53 year old that we actually saw last year for severe osteoarthritis of his left knee.  He has new x-rays that were done recently.  He actually has steroid injection in his left knee just 4 weeks ago and he said that is helped somewhat.  He is someone who does weigh 278 pounds but his BMI is 34.75.  His left knee pain is daily and it is detriment affecting his mobility, his quality of life and his actives daily living  to the point he would like to consider knee replacement surgery but not hopefully until sometime a year from now if he can continue to at least keep the pain down with his knee.  He has had cervical spine surgery in August of this year which she is still recovering from.  His left knee pain is daily and he does walk with a limp.  HPI  Review of Systems He currently denies any shortness of breath, chest pain, headache, fever, chills, nausea, vomiting  Objective: Vital Signs: Ht 6\' 3"  (1.905 m)   Wt 278 lb (126.1 kg)   BMI 34.75 kg/m   Physical Exam Is alert and orient x3 and in no acute distress Ortho Exam Examination of his left knee does show varus malalignment.  There is significant medial joint line tenderness.  There is patellofemoral crepitation as well.  He has good range of motion of the knee otherwise.  There is no lateral compartment pain. Specialty Comments:  No specialty comments available.  Imaging: No results found. X-rays independently reviewed of the left knee show severe end-stage arthritis with varus malalignment.  There are large periarticular osteophytes in the patellofemoral joint area and the medial compartment.  There is almost complete loss of  medial joint space.  There is severe narrowing at the patellofemoral joint.  PMFS History: Patient Active Problem List   Diagnosis Date Noted  . Unilateral primary osteoarthritis, left knee 08/31/2019  . Other spondylosis with myelopathy, cervical region 05/18/2019  . S/P cervical spinal fusion 05/18/2019  . Cervical stenosis of spine 05/09/2019  . Spinal stenosis of cervical region 05/04/2019   Past Medical History:  Diagnosis Date  . Hypercholesteremia   . Hypertension     History reviewed. No pertinent family history.  Past Surgical History:  Procedure Laterality Date  . ANTERIOR CERVICAL DECOMP/DISCECTOMY FUSION N/A 05/09/2019   Procedure: C4-5, C5-6 ANTERIOR CERVICAL DECOMPRESSION/DISCECTOMY FUSION,  ALLOGRAFT, PLATE;  Surgeon: Marybelle Killings, MD;  Location: [facility];  Service: Orthopedics;  Laterality: N/A;  . BONE CYST EXCISION     left leg  . INCISION AND DRAINAGE     hand   Social History   Occupational History  . Not on file  Tobacco Use  . Smoking status: Never Smoker  . Smokeless tobacco: Never Used  Substance and Sexual Activity  . Alcohol use: Yes    Comment: rare beer  . Drug use: Never  . Sexual activity: Not on file

## 2019-09-26 ENCOUNTER — Telehealth: Payer: Self-pay

## 2019-09-26 NOTE — Telephone Encounter (Signed)
Talked with patient concerning insurance.

## 2019-09-26 NOTE — Telephone Encounter (Signed)
Submitted VOB for SynviscOne, left knee. 

## 2019-09-27 ENCOUNTER — Telehealth: Payer: Self-pay

## 2019-09-27 NOTE — Telephone Encounter (Signed)
PA required for SynviscOne, left knee. Initiated PA with [employer] for [facility], left knee. Reference/Tracking# [id number]/26/2019-10:15am CB# Laurina Bustle is [phone]

## 2019-09-28 ENCOUNTER — Telehealth: Payer: Self-pay

## 2019-09-28 ENCOUNTER — Ambulatory Visit: Payer: BLUE CROSS/BLUE SHIELD | Admitting: Orthopaedic Surgery

## 2019-09-28 NOTE — Telephone Encounter (Signed)
Received fax from [facility] concerning additional information needed for SynviscOne, left knee. Faxed office notes to [facility] at [phone].

## 2019-10-07 ENCOUNTER — Telehealth: Payer: Self-pay

## 2019-10-07 NOTE — Telephone Encounter (Signed)
Patient aware that he is approved for gel injection.  Approved for SynviscOne, left knee. Buy & Bill Must meet deductible first Patient will be responsible for 20% OOP. Co-pay of $100.00 required PA required PA Approval# [id number] Valid 09/27/2019- 03/24/2020  Appt. 10/12/2019 with Dr. Ninfa Linden  Per Mala with [facility] authorization is approved.  Approval letter will be faxed.

## 2019-10-12 ENCOUNTER — Encounter: Payer: Self-pay | Admitting: Orthopaedic Surgery

## 2019-10-12 ENCOUNTER — Other Ambulatory Visit: Payer: Self-pay

## 2019-10-12 ENCOUNTER — Ambulatory Visit: Payer: BLUE CROSS/BLUE SHIELD | Admitting: Orthopaedic Surgery

## 2019-10-12 DIAGNOSIS — M1712 Unilateral primary osteoarthritis, left knee: Secondary | ICD-10-CM

## 2019-10-12 MED ORDER — LIDOCAINE HCL 1 % IJ SOLN
0.5000 mL | INTRAMUSCULAR | Status: AC | PRN
  Administered 2019-10-12: .5 mL

## 2019-10-12 MED ORDER — HYLAN G-F 20 48 MG/6ML IX SOSY
48.0000 mg | PREFILLED_SYRINGE | INTRA_ARTICULAR | Status: AC | PRN
  Administered 2019-10-12: 48 mg via INTRA_ARTICULAR

## 2019-10-12 NOTE — Progress Notes (Signed)
   Procedure Note  Patient: Victor Conner             Date of Birth: 1966-08-03           MRN: [id number]             Visit Date: 10/12/2019  HPI: Victor Conner comes in today for Synvisc 1 injection left knee.  He states that he is taking Advil 3 tablets a day.  He has had no injury to the knee.  He is trying to lose weight to work on strengthening the knee.  Physical exam: Left knee full extension full flexion.  No abnormal warmth erythema or effusion.  Procedures: Visit Diagnoses:  1. Unilateral primary osteoarthritis, left knee     Large Joint Inj on 10/12/2019 9:14 AM Indications: pain Details: 22 G 1.5 in needle, anterolateral approach  Arthrogram: No  Medications: 0.5 mL lidocaine 1 %; 48 mg Hylan 48 MG/6ML Outcome: tolerated well, no immediate complications Procedure, treatment alternatives, risks and benefits explained, specific risks discussed. Consent was given by the patient. Immediately prior to procedure a time out was called to verify the correct patient, procedure, equipment, support staff and site/side marked as required. Patient was prepped and draped in the usual sterile fashion.     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  He can continue his over-the-counter NSAIDs.  Also recommend he is begin taking turmeric.  He understands to wait at least 6 months between supplemental injections.  He may need to have a cortisone injection in the interim.  He will follow up with [place] as needed.  Questions encouraged and answered.

## 2020-01-26 ENCOUNTER — Encounter: Payer: Self-pay | Admitting: Orthopaedic Surgery

## 2020-01-26 ENCOUNTER — Ambulatory Visit: Payer: BLUE CROSS/BLUE SHIELD | Admitting: Orthopaedic Surgery

## 2020-01-26 ENCOUNTER — Other Ambulatory Visit: Payer: Self-pay

## 2020-01-26 VITALS — Ht 75.0 in | Wt 278.0 lb

## 2020-01-26 DIAGNOSIS — M25562 Pain in left knee: Secondary | ICD-10-CM

## 2020-01-26 DIAGNOSIS — G8929 Other chronic pain: Secondary | ICD-10-CM

## 2020-01-26 DIAGNOSIS — M1712 Unilateral primary osteoarthritis, left knee: Secondary | ICD-10-CM

## 2020-01-26 MED ORDER — METHYLPREDNISOLONE ACETATE 40 MG/ML IJ SUSP
40.0000 mg | INTRAMUSCULAR | Status: AC | PRN
  Administered 2020-01-26: 16:00:00 40 mg via INTRA_ARTICULAR

## 2020-01-26 MED ORDER — LIDOCAINE HCL 1 % IJ SOLN
3.0000 mL | INTRAMUSCULAR | Status: AC | PRN
  Administered 2020-01-26: 3 mL

## 2020-01-26 NOTE — Progress Notes (Signed)
Office Visit Note   Patient: Victor Conner           Date of Birth: 1966-08-11           MRN: [id number] Visit Date: 01/26/2020              Requested by: Lucia Gaskins, MD [address] [place],  [place] [address] PCP: Lucia Gaskins, MD   Assessment & Plan: Visit Diagnoses:  1. Unilateral primary osteoarthritis, left knee   2. Chronic pain of left knee     Plan: I did go over knee replacement surgery in detail with the patient today.  I showed him a knee model and his x-rays and went over in detail what the risk and benefits are of this type of surgery.  We discussed knee replacement surgery in terms of showing him how the surgery is performed using a knee model and his x-rays.  All questions and concerns were answered and addressed.  At this point given the failed conservative treatment over a year and given the severity of arthritis and his pain, I feel that is reasonable to proceed with a left total knee arthroplasty as does he.  We will work on getting this scheduled.  Follow-Up Instructions: Return for 2 weeks post-op.   Orders:  Orders Placed This Encounter  Procedures  . Large Joint Inj: L knee   No orders of the defined types were placed in this encounter.     Procedures: Large Joint Inj: L knee on 01/26/2020 4:06 PM Indications: diagnostic evaluation and pain Details: 22 G 1.5 in needle, superolateral approach  Arthrogram: No  Medications: 3 mL lidocaine 1 %; 40 mg methylPREDNISolone acetate 40 MG/ML Outcome: tolerated well, no immediate complications Procedure, treatment alternatives, risks and benefits explained, specific risks discussed. Consent was given by the patient. Immediately prior to procedure a time out was called to verify the correct patient, procedure, equipment, support staff and site/side marked as required. Patient was prepped and draped in the usual sterile fashion.       Clinical Data: No additional findings.   Subjective: No  chief complaint on file. The patient is very well-known to me.  He is a very active and pleasant 54 year old gentleman with known end-stage arthritis involving his left knee.  He has tried and failed conservative treatment for over a year.  This includes steroid injections as well as hyaluronic acid injections into the left knee.  He has tried anti-inflammatories.  He is worked on weight loss and activity modification.  He is also worked on Astronomer.  His x-rays in the past have shown severe end-stage arthritis of the left knee.  At this point his left knee pain is detrimentally affecting his mobility, his quality of life and his actives daily living.  He walks with a significant limp and now it significantly affecting his back as well.  At this point given the failure of all forms of conservative treatment and the fact that his pain is become 10 out of 10 on a daily basis, he wishes to proceed with knee replacement surgery.  He has had no recent acute changes in medical status.  HPI  Review of Systems He currently denies any headache, chest pain, shortness of breath, fever, chills, nausea, vomiting  Objective: Vital Signs: Ht 6\' 3"  (1.905 m)   Wt 278 lb (126.1 kg)   BMI 34.75 kg/m   Physical Exam He is alert and orient x3 and in no acute  distress Ortho Exam Examination of his left knee shows obvious varus malalignment.  There is significant medial joint line tenderness and patellofemoral crepitation.  His range of motion lacks full extension by few degrees and I can flex him to past 90 degrees.  The knee feels loosely stable but very painful on exam. Specialty Comments:  No specialty comments available.  Imaging: No results found. Previous x-rays of the left knee from November of last year shows severe end-stage arthritis.  There is varus malalignment.  The medial joint space is complete loss with bone-on-bone wear.  There is severe patellofemoral static changes.  There  are osteophytes in all 3 compartments.  PMFS History: Patient Active Problem List   Diagnosis Date Noted  . Unilateral primary osteoarthritis, left knee 08/31/2019  . Other spondylosis with myelopathy, cervical region 05/18/2019  . S/P cervical spinal fusion 05/18/2019  . Cervical stenosis of spine 05/09/2019  . Spinal stenosis of cervical region 05/04/2019   Past Medical History:  Diagnosis Date  . Hypercholesteremia   . Hypertension     History reviewed. No pertinent family history.  Past Surgical History:  Procedure Laterality Date  . ANTERIOR CERVICAL DECOMP/DISCECTOMY FUSION N/A 05/09/2019   Procedure: C4-5, C5-6 ANTERIOR CERVICAL DECOMPRESSION/DISCECTOMY FUSION, ALLOGRAFT, PLATE;  Surgeon: Marybelle Killings, MD;  Location: [facility];  Service: Orthopedics;  Laterality: N/A;  . BONE CYST EXCISION     left leg  . INCISION AND DRAINAGE     hand   Social History   Occupational History  . Not on file  Tobacco Use  . Smoking status: Never Smoker  . Smokeless tobacco: Never Used  Substance and Sexual Activity  . Alcohol use: Yes    Comment: rare beer  . Drug use: Never  . Sexual activity: Not on file

## 2020-03-07 ENCOUNTER — Other Ambulatory Visit: Payer: Self-pay | Admitting: Family

## 2020-03-08 NOTE — Patient Instructions (Addendum)
DUE TO COVID-19 ONLY ONE VISITOR IS ALLOWED TO COME WITH YOU AND STAY IN THE WAITING ROOM ONLY DURING PRE OP AND PROCEDURE DAY OF SURGERY. THE 1 VISITOR MAY VISIT WITH YOU AFTER SURGERY IN YOUR PRIVATE ROOM DURING VISITING HOURS ONLY!  YOU NEED TO HAVE A COVID 19 TEST ON: 03/13/20@1 :00 pm , THIS TEST MUST BE DONE BEFORE SURGERY, COME  Victor Conner , [id number].  ([place]) ONCE YOUR COVID TEST IS COMPLETED, PLEASE BEGIN THE QUARANTINE INSTRUCTIONS AS OUTLINED IN YOUR HANDOUT.                Victor Conner   Your procedure is scheduled on: 03/16/20   Report to [facility] Main  Entrance   Report to admitting at: 7:15 AM     Call this number if you have problems the morning of surgery [phone]    Remember:  NO SOLID FOOD AFTER MIDNIGHT THE NIGHT PRIOR TO SURGERY. NOTHING BY MOUTH EXCEPT CLEAR LIQUIDS UNTIL: 6:45 am . PLEASE FINISH ENSURE DRINK PER SURGEON ORDER  WHICH NEEDS TO BE COMPLETED AT: 6:45 am .   CLEAR LIQUID DIET   Foods Allowed                                                                     Foods Excluded  Coffee and tea, regular and decaf                             liquids that you cannot  Plain Jell-O any favor except red or purple                                           see through such as: Fruit ices (not with fruit pulp)                                     milk, soups, orange juice  Iced Popsicles                                    All solid food Carbonated beverages, regular and diet                                    Cranberry, grape and apple juices Sports drinks like Gatorade Lightly seasoned clear broth or consume(fat free) Sugar, honey syrup  Sample Menu Breakfast                                Lunch                                     Supper Cranberry juice  Beef broth                            Chicken broth Jell-O                                     Grape juice                            Apple juice Coffee or tea                        Jell-O                                      Popsicle                                                Coffee or tea                        Coffee or tea  _____________________________________________________________________  BRUSH YOUR TEETH MORNING OF SURGERY AND RINSE YOUR MOUTH OUT, NO CHEWING GUM CANDY OR MINTS.     Take these medicines the morning of surgery with A SIP OF WATER: amlodipine,esomeprazole.                                 You may not have any metal on your body including hair pins and              piercings  Do not wear jewelry, lotions, powders or perfumes, deodorant             Men may shave face and neck.   Do not bring valuables to the hospital. Victor Conner.  Contacts, dentures or bridgework may not be worn into surgery.  Leave suitcase in the car. After surgery it may be brought to your room.     Patients discharged the day of surgery will not be allowed to drive home. IF YOU ARE HAVING SURGERY AND GOING HOME THE SAME DAY, YOU MUST HAVE AN ADULT TO DRIVE YOU HOME AND BE WITH YOU FOR 24 HOURS. YOU MAY GO HOME BY TAXI OR UBER OR ORTHERWISE, BUT AN ADULT MUST ACCOMPANY YOU HOME AND STAY WITH YOU FOR 24 HOURS.  Name and phone number of your driver:  Special Instructions: N/A              Please read over the following fact sheets you were given: _____________________________________________________________________  [facility] - Preparing for Surgery Before surgery, you can play an important role.  Because skin is not sterile, your skin needs to be as free of germs as possible.  You can reduce the number of germs on your skin by washing with CHG (chlorahexidine gluconate) soap before surgery.  CHG is an antiseptic cleaner which kills germs and bonds with the skin to continue killing germs even after washing. Please DO NOT  use if you have an allergy to CHG or antibacterial  soaps.  If your skin becomes reddened/irritated stop using the CHG and inform your nurse when you arrive at Short Stay. Do not shave (including legs and underarms) for at least 48 hours prior to the first CHG shower.  You may shave your face/neck. Please follow these instructions carefully:  1.  Shower with CHG Soap the night before surgery and the  morning of Surgery.  2.  If you choose to wash your hair, wash your hair first as usual with your  normal  shampoo.  3.  After you shampoo, rinse your hair and body thoroughly to remove the  shampoo.                           4.  Use CHG as you would any other liquid soap.  You can apply chg directly  to the skin and wash                       Gently with a scrungie or clean washcloth.  5.  Apply the CHG Soap to your body ONLY FROM THE NECK DOWN.   Do not use on face/ open                           Wound or open sores. Avoid contact with eyes, ears mouth and genitals (private parts).                       Wash face,  Genitals (private parts) with your normal soap.             6.  Wash thoroughly, paying special attention to the area where your surgery  will be performed.  7.  Thoroughly rinse your body with warm water from the neck down.  8.  DO NOT shower/wash with your normal soap after using and rinsing off  the CHG Soap.                9.  Pat yourself dry with a clean towel.            10.  Wear clean pajamas.            11.  Place clean sheets on your bed the night of your first shower and do not  sleep with pets. Day of Surgery : Do not apply any lotions/deodorants the morning of surgery.  Please wear clean clothes to the hospital/surgery center.  FAILURE TO FOLLOW THESE INSTRUCTIONS MAY RESULT IN THE CANCELLATION OF YOUR SURGERY PATIENT SIGNATURE_________________________________  NURSE SIGNATURE__________________________________  ________________________________________________________________________   Victor Conner  An  incentive spirometer is a tool that can help keep your lungs clear and active. This tool measures how well you are filling your lungs with each breath. Taking long deep breaths may help reverse or decrease the chance of developing breathing (pulmonary) problems (especially infection) following:  A long period of time when you are unable to move or be active. BEFORE THE PROCEDURE   If the spirometer includes an indicator to show your best effort, your nurse or respiratory therapist will set it to a desired goal.  If possible, sit up straight or lean slightly forward. Try not to slouch.  Hold the incentive spirometer in an upright position. INSTRUCTIONS FOR USE  1. Sit on the edge of your bed if possible,  or sit up as far as you can in bed or on a chair. 2. Hold the incentive spirometer in an upright position. 3. Breathe out normally. 4. Place the mouthpiece in your mouth and seal your lips tightly around it. 5. Breathe in slowly and as deeply as possible, raising the piston or the ball toward the top of the column. 6. Hold your breath for 3-5 seconds or for as long as possible. Allow the piston or ball to fall to the bottom of the column. 7. Remove the mouthpiece from your mouth and breathe out normally. 8. Rest for a few seconds and repeat Steps 1 through 7 at least 10 times every 1-2 hours when you are awake. Take your time and take a few normal breaths between deep breaths. 9. The spirometer may include an indicator to show your best effort. Use the indicator as a goal to work toward during each repetition. 10. After each set of 10 deep breaths, practice coughing to be sure your lungs are clear. If you have an incision (the cut made at the time of surgery), support your incision when coughing by placing a pillow or rolled up towels firmly against it. Once you are able to get out of bed, walk around indoors and cough well. You may stop using the incentive spirometer when instructed by your  caregiver.  RISKS AND COMPLICATIONS  Take your time so you do not get dizzy or light-headed.  If you are in pain, you may need to take or ask for pain medication before doing incentive spirometry. It is harder to take a deep breath if you are having pain. AFTER USE  Rest and breathe slowly and easily.  It can be helpful to keep track of a log of your progress. Your caregiver can provide you with a simple table to help with this. If you are using the spirometer at home, follow these instructions: [place] IF:   You are having difficultly using the spirometer.  You have trouble using the spirometer as often as instructed.  Your pain medication is not giving enough relief while using the spirometer.  You develop fever of 100.5 F (38.1 C) or higher. SEEK IMMEDIATE MEDICAL CARE IF:   You cough up bloody sputum that had not been present before.  You develop fever of 102 F (38.9 C) or greater.  You develop worsening pain at or near the incision site. MAKE SURE YOU:   Understand these instructions.  Will watch your condition.  Will get help right away if you are not doing well or get worse. Document Released: 01/19/2007 Document Revised: 12/01/2011 Document Reviewed: 03/22/2007 [facility] Patient Information 2014 [place], [place].   ________________________________________________________________________

## 2020-03-09 ENCOUNTER — Encounter (HOSPITAL_COMMUNITY)
Admission: RE | Admit: 2020-03-09 | Discharge: 2020-03-09 | Disposition: A | Discharge status: Home / Self Care | Payer: BLUE CROSS/BLUE SHIELD | Source: Ambulatory Visit | Attending: Orthopaedic Surgery | Admitting: Orthopaedic Surgery

## 2020-03-09 ENCOUNTER — Other Ambulatory Visit: Payer: Self-pay

## 2020-03-09 ENCOUNTER — Encounter (HOSPITAL_COMMUNITY): Payer: Self-pay

## 2020-03-09 DIAGNOSIS — Z01818 Encounter for other preprocedural examination: Secondary | ICD-10-CM | POA: Insufficient documentation

## 2020-03-09 HISTORY — DX: Unspecified osteoarthritis, unspecified site: M19.90

## 2020-03-09 LAB — BASIC METABOLIC PANEL
Anion gap: 9 (ref 5–15)
BUN: 19 mg/dL (ref 6–20)
CO2: 26 mmol/L (ref 22–32)
Calcium: 8.9 mg/dL (ref 8.9–10.3)
Chloride: 103 mmol/L (ref 98–111)
Creatinine, Ser: 1.04 mg/dL (ref 0.61–1.24)
GFR calc Af Amer: >60 mL/min (ref >60)
GFR calc non Af Amer: >60 mL/min (ref >60)
Glucose, Bld: 97 mg/dL (ref 70–99)
Potassium: 4.5 mmol/L (ref 3.5–5.1)
Sodium: 138 mmol/L (ref 135–145)

## 2020-03-09 LAB — CBC
HCT: 45.2 % (ref 39.0–52.0)
Hemoglobin: 15 g/dL (ref 13.0–17.0)
MCH: 30.8 pg (ref 26.0–34.0)
MCHC: 33.2 g/dL (ref 30.0–36.0)
MCV: 92.8 fL (ref 80.0–100.0)
Platelets: 197 10*3/uL (ref 150–400)
RBC: 4.87 MIL/uL (ref 4.22–5.81)
RDW: 13.2 % (ref 11.5–15.5)
WBC: 7.7 10*3/uL (ref 4.0–10.5)
nRBC: 0 % (ref 0.0–0.2)

## 2020-03-09 LAB — SURGICAL PCR SCREEN
MRSA, PCR: NEGATIVE
Staphylococcus aureus: NEGATIVE

## 2020-03-09 NOTE — Progress Notes (Signed)
COVID Vaccine Completed:No Date COVID Vaccine completed: COVID vaccine manufacturer: Pfizer    Moderna   [employer]   PCP - Dr. Lucia Gaskins Cardiologist -   Chest x-ray -  EKG -  05/09/19 Stress Test -  ECHO -  Cardiac Cath -   Sleep Study -  CPAP -   Fasting Blood Sugar -  Checks Blood Sugar _____ times a day  Blood Thinner Instructions: Aspirin Instructions: Last Dose:  Anesthesia review:   Patient denies shortness of breath, fever, cough and chest pain at PAT appointment   Patient verbalized understanding of instructions that were given to them at the PAT appointment. Patient was also instructed that they will need to review over the PAT instructions again at home before surgery.

## 2020-03-13 ENCOUNTER — Other Ambulatory Visit (HOSPITAL_COMMUNITY)
Admission: RE | Admit: 2020-03-13 | Discharge: 2020-03-13 | Disposition: A | Discharge status: Home / Self Care | Payer: BLUE CROSS/BLUE SHIELD | Source: Ambulatory Visit | Attending: Orthopaedic Surgery | Admitting: Orthopaedic Surgery

## 2020-03-13 DIAGNOSIS — Z20822 Contact with and (suspected) exposure to covid-19: Secondary | ICD-10-CM | POA: Insufficient documentation

## 2020-03-13 DIAGNOSIS — Z01812 Encounter for preprocedural laboratory examination: Secondary | ICD-10-CM | POA: Insufficient documentation

## 2020-03-13 LAB — SARS CORONAVIRUS 2 (TAT 6-24 HRS): SARS Coronavirus 2: NEGATIVE

## 2020-03-15 MED ORDER — DEXTROSE 5 % IV SOLN
3.0000 g | INTRAVENOUS | Status: AC
  Administered 2020-03-16: 3 g via INTRAVENOUS
  Filled 2020-03-15: qty 3

## 2020-03-15 NOTE — H&P (Signed)
TOTAL KNEE ADMISSION H&P  Patient is being admitted for left total knee arthroplasty.  Subjective:  Chief Complaint:left knee pain.  HPI: Victor Conner, 54 y.o. male, has a history of pain and functional disability in the left knee due to arthritis and has failed non-surgical conservative treatments for greater than 12 weeks to includeNSAID's and/or analgesics, corticosteriod injections, viscosupplementation injections, flexibility and strengthening excercises, weight reduction as appropriate and activity modification.  Onset of symptoms was gradual, starting 5 years ago with gradually worsening course since that time. The patient noted no past surgery on the left knee(s).  Patient currently rates pain in the left knee(s) at 10 out of 10 with activity. Patient has night pain, worsening of pain with activity and weight bearing, pain that interferes with activities of daily living, pain with passive range of motion, crepitus and joint swelling.  Patient has evidence of subchondral sclerosis, periarticular osteophytes and joint space narrowing by imaging studies. There is no active infection.  Patient Active Problem List   Diagnosis Date Noted  . Unilateral primary osteoarthritis, left knee 08/31/2019  . Other spondylosis with myelopathy, cervical region 05/18/2019  . S/P cervical spinal fusion 05/18/2019  . Cervical stenosis of spine 05/09/2019  . Spinal stenosis of cervical region 05/04/2019   Past Medical History:  Diagnosis Date  . Arthritis   . Hypercholesteremia   . Hypertension     Past Surgical History:  Procedure Laterality Date  . ANTERIOR CERVICAL DECOMP/DISCECTOMY FUSION N/A 05/09/2019   Procedure: C4-5, C5-6 ANTERIOR CERVICAL DECOMPRESSION/DISCECTOMY FUSION, ALLOGRAFT, PLATE;  Surgeon: Marybelle Killings, MD;  Location: [place];  Service: Orthopedics;  Laterality: N/A;  . BONE CYST EXCISION     left leg  . INCISION AND DRAINAGE     hand    Current Facility-Administered  Medications  Medication Dose Route Frequency Provider Last Rate Last Admin  . [START ON 03/16/2020] ceFAZolin (ANCEF) 3 g in dextrose 5 % 50 mL IVPB  3 g Intravenous On Call to [place], MD       Current Outpatient Medications  Medication Sig Dispense Refill Last Dose  . amLODipine (NORVASC) 10 MG tablet Take 5 mg by mouth daily.     . Cholecalciferol (VITAMIN D-3) 125 MCG (5000 UT) TABS Take 5,000 Units by mouth daily.     Marland Kitchen esomeprazole (NEXIUM) 20 MG capsule Take 40 mg by mouth daily as needed (acid reflux/indigestion.).     Marland Kitchen lisinopril (ZESTRIL) 20 MG tablet Take 20 mg by mouth daily.      . Multiple Vitamins-Minerals (MULTIVITAMIN WITH MINERALS) tablet Take 1 tablet by mouth 2 (two) times a week.      . naproxen sodium (ALEVE) 220 MG tablet Take 220-440 mg by mouth 2 (two) times daily as needed (pain.).     Marland Kitchen simvastatin (ZOCOR) 40 MG tablet Take 40 mg by mouth at bedtime.       Allergies  Allergen Reactions  . Morphine And Related Itching  . Shrimp [Shellfish Allergy] Swelling    Eyes/Throat swelling    Social History   Tobacco Use  . Smoking status: Never Smoker  . Smokeless tobacco: Never Used  Substance Use Topics  . Alcohol use: Yes    Comment: rare beer    No family history on file.   Review of Systems  Musculoskeletal: Positive for joint swelling.  All other systems reviewed and are negative.   Objective:  Physical Exam  Vitals reviewed. Constitutional: He is oriented to person,  place, and time.  HENT:  Head: Normocephalic and atraumatic.  Eyes: Pupils are equal, round, and reactive to light.  Cardiovascular: Normal rate and normal pulses.  Respiratory: Effort normal.  GI: Soft. Normal appearance.  Musculoskeletal:     Left knee: Swelling, deformity, effusion and bony tenderness present. Decreased range of motion. Tenderness present over the medial joint line. Abnormal alignment and abnormal meniscus.  Neurological: He is alert and  oriented to person, place, and time.  Skin: Skin is warm.  Psychiatric: His behavior is normal.    Vital signs in last 24 hours:    Labs:   Estimated body mass index is 35.06 kg/m as calculated from the following:   Height as of 03/09/20: 6\' 4"  (1.93 m).   Weight as of 03/09/20: 130.6 kg.   Imaging Review Plain radiographs demonstrate severe degenerative joint disease of the left knee(s). The overall alignment ismild varus. The bone quality appears to be excellent for age and reported activity level.      Assessment/Plan:  End stage arthritis, left knee   The patient history, physical examination, clinical judgment of the provider and imaging studies are consistent with end stage degenerative joint disease of the left knee(s) and total knee arthroplasty is deemed medically necessary. The treatment options including medical management, injection therapy arthroscopy and arthroplasty were discussed at length. The risks and benefits of total knee arthroplasty were presented and reviewed. The risks due to aseptic loosening, infection, stiffness, patella tracking problems, thromboembolic complications and other imponderables were discussed. The patient acknowledged the explanation, agreed to proceed with the plan and consent was signed. Patient is being admitted for inpatient treatment for surgery, pain control, PT, OT, prophylactic antibiotics, VTE prophylaxis, progressive ambulation and ADL's and discharge planning. The patient is planning to be discharged home with home health services

## 2020-03-16 ENCOUNTER — Other Ambulatory Visit: Payer: Self-pay

## 2020-03-16 ENCOUNTER — Inpatient Hospital Stay (HOSPITAL_COMMUNITY): Payer: BLUE CROSS/BLUE SHIELD

## 2020-03-16 ENCOUNTER — Observation Stay (HOSPITAL_COMMUNITY)
Admission: RE | Admit: 2020-03-16 | Discharge: 2020-03-17 | Disposition: A | Discharge status: Home Health | Payer: BLUE CROSS/BLUE SHIELD | Source: Ambulatory Visit | Attending: Orthopaedic Surgery | Admitting: Orthopaedic Surgery

## 2020-03-16 ENCOUNTER — Ambulatory Visit (HOSPITAL_COMMUNITY): Payer: BLUE CROSS/BLUE SHIELD | Admitting: Certified Registered Nurse Anesthetist

## 2020-03-16 ENCOUNTER — Encounter (HOSPITAL_COMMUNITY): Payer: Self-pay | Admitting: Orthopaedic Surgery

## 2020-03-16 ENCOUNTER — Encounter (HOSPITAL_COMMUNITY)
Admission: RE | Disposition: A | Discharge status: Home Health | Payer: Self-pay | Source: Ambulatory Visit | Attending: Orthopaedic Surgery

## 2020-03-16 DIAGNOSIS — Z96652 Presence of left artificial knee joint: Secondary | ICD-10-CM

## 2020-03-16 DIAGNOSIS — Z471 Aftercare following joint replacement surgery: Secondary | ICD-10-CM | POA: Diagnosis not present

## 2020-03-16 DIAGNOSIS — Z91013 Allergy to seafood: Secondary | ICD-10-CM | POA: Diagnosis not present

## 2020-03-16 DIAGNOSIS — G8918 Other acute postprocedural pain: Secondary | ICD-10-CM | POA: Diagnosis not present

## 2020-03-16 DIAGNOSIS — Z7982 Long term (current) use of aspirin: Secondary | ICD-10-CM | POA: Insufficient documentation

## 2020-03-16 DIAGNOSIS — Z981 Arthrodesis status: Secondary | ICD-10-CM | POA: Diagnosis not present

## 2020-03-16 DIAGNOSIS — M1712 Unilateral primary osteoarthritis, left knee: Principal | ICD-10-CM | POA: Diagnosis present

## 2020-03-16 DIAGNOSIS — Z885 Allergy status to narcotic agent status: Secondary | ICD-10-CM | POA: Diagnosis not present

## 2020-03-16 DIAGNOSIS — I10 Essential (primary) hypertension: Secondary | ICD-10-CM | POA: Insufficient documentation

## 2020-03-16 DIAGNOSIS — E7800 Pure hypercholesterolemia, unspecified: Secondary | ICD-10-CM | POA: Insufficient documentation

## 2020-03-16 DIAGNOSIS — Z79899 Other long term (current) drug therapy: Secondary | ICD-10-CM | POA: Diagnosis not present

## 2020-03-16 HISTORY — PX: TOTAL KNEE ARTHROPLASTY: SHX125

## 2020-03-16 SURGERY — ARTHROPLASTY, KNEE, TOTAL
Anesthesia: Spinal | Site: Knee | Laterality: Left

## 2020-03-16 MED ORDER — BUPIVACAINE HCL (PF) 0.25 % IJ SOLN
INTRAMUSCULAR | Status: AC
  Filled 2020-03-16: qty 30

## 2020-03-16 MED ORDER — TRANEXAMIC ACID-NACL 1000-0.7 MG/100ML-% IV SOLN
1000.0000 mg | INTRAVENOUS | Status: AC
  Administered 2020-03-16: 1000 mg via INTRAVENOUS
  Filled 2020-03-16: qty 100

## 2020-03-16 MED ORDER — PANTOPRAZOLE SODIUM 40 MG PO TBEC
40.0000 mg | DELAYED_RELEASE_TABLET | Freq: Every day | ORAL | Status: DC
  Administered 2020-03-16 – 2020-03-17 (×2): 40 mg via ORAL
  Filled 2020-03-16 (×2): qty 1

## 2020-03-16 MED ORDER — DIPHENHYDRAMINE HCL 12.5 MG/5ML PO ELIX: 12.5000 mg | ORAL_SOLUTION | ORAL | Status: DC | PRN

## 2020-03-16 MED ORDER — OXYCODONE HCL 5 MG/5ML PO SOLN: 5.0000 mg | Freq: Once | ORAL | Status: DC | PRN

## 2020-03-16 MED ORDER — ONDANSETRON HCL 4 MG/2ML IJ SOLN
INTRAMUSCULAR | Status: DC | PRN
  Administered 2020-03-16: 4 mg via INTRAVENOUS

## 2020-03-16 MED ORDER — CHLORHEXIDINE GLUCONATE 0.12 % MT SOLN
15.0000 mL | Freq: Once | OROMUCOSAL | Status: AC
  Administered 2020-03-16: 15 mL via OROMUCOSAL

## 2020-03-16 MED ORDER — POLYETHYLENE GLYCOL 3350 17 G PO PACK: 17.0000 g | PACK | Freq: Every day | ORAL | Status: DC | PRN

## 2020-03-16 MED ORDER — FENTANYL CITRATE (PF) 100 MCG/2ML IJ SOLN: 25.0000 ug | INTRAMUSCULAR | Status: DC | PRN

## 2020-03-16 MED ORDER — ONDANSETRON HCL 4 MG PO TABS: 4.0000 mg | ORAL_TABLET | Freq: Four times a day (QID) | ORAL | Status: DC | PRN

## 2020-03-16 MED ORDER — MENTHOL 3 MG MT LOZG: 1.0000 | LOZENGE | OROMUCOSAL | Status: DC | PRN

## 2020-03-16 MED ORDER — BUPIVACAINE IN DEXTROSE 0.75-8.25 % IT SOLN
INTRATHECAL | Status: DC | PRN
  Administered 2020-03-16: 1.8 mL via INTRATHECAL

## 2020-03-16 MED ORDER — ASPIRIN 81 MG PO CHEW
81.0000 mg | CHEWABLE_TABLET | Freq: Two times a day (BID) | ORAL | Status: DC
  Administered 2020-03-16 – 2020-03-17 (×2): 81 mg via ORAL
  Filled 2020-03-16 (×2): qty 1

## 2020-03-16 MED ORDER — DEXAMETHASONE SODIUM PHOSPHATE 10 MG/ML IJ SOLN
INTRAMUSCULAR | Status: DC | PRN
  Administered 2020-03-16: 10 mg via INTRAVENOUS

## 2020-03-16 MED ORDER — BUPIVACAINE HCL (PF) 0.25 % IJ SOLN
INTRAMUSCULAR | Status: DC | PRN
  Administered 2020-03-16: 30 mL

## 2020-03-16 MED ORDER — METHOCARBAMOL 500 MG IVPB - SIMPLE MED
500.0000 mg | Freq: Four times a day (QID) | INTRAVENOUS | Status: DC | PRN
  Filled 2020-03-16: qty 50

## 2020-03-16 MED ORDER — OXYCODONE HCL 5 MG PO TABS: 10.0000 mg | ORAL_TABLET | ORAL | Status: DC | PRN

## 2020-03-16 MED ORDER — ROPIVACAINE HCL 5 MG/ML IJ SOLN
INTRAMUSCULAR | Status: DC | PRN
  Administered 2020-03-16: 30 mL via PERINEURAL

## 2020-03-16 MED ORDER — 0.9 % SODIUM CHLORIDE (POUR BTL) OPTIME
TOPICAL | Status: DC | PRN
  Administered 2020-03-16: 1000 mL

## 2020-03-16 MED ORDER — PHENYLEPHRINE 40 MCG/ML (10ML) SYRINGE FOR IV PUSH (FOR BLOOD PRESSURE SUPPORT)
PREFILLED_SYRINGE | INTRAVENOUS | Status: DC | PRN
  Administered 2020-03-16: 80 ug via INTRAVENOUS
  Administered 2020-03-16: 120 ug via INTRAVENOUS

## 2020-03-16 MED ORDER — ADULT MULTIVITAMIN W/MINERALS CH: 1.0000 | ORAL_TABLET | ORAL | Status: DC

## 2020-03-16 MED ORDER — ONDANSETRON HCL 4 MG/2ML IJ SOLN: 4.0000 mg | Freq: Four times a day (QID) | INTRAMUSCULAR | Status: DC | PRN

## 2020-03-16 MED ORDER — OXYCODONE HCL 5 MG PO TABS: 5.0000 mg | ORAL_TABLET | Freq: Once | ORAL | Status: DC | PRN

## 2020-03-16 MED ORDER — SODIUM CHLORIDE 0.9 % IR SOLN
Status: DC | PRN
  Administered 2020-03-16: 1000 mL

## 2020-03-16 MED ORDER — POVIDONE-IODINE 10 % EX SWAB: 2.0000 "application " | Freq: Once | CUTANEOUS | Status: DC

## 2020-03-16 MED ORDER — PROPOFOL 500 MG/50ML IV EMUL
INTRAVENOUS | Status: DC | PRN
  Administered 2020-03-16: 150 ug/kg/min via INTRAVENOUS

## 2020-03-16 MED ORDER — ZOLPIDEM TARTRATE 5 MG PO TABS: 5.0000 mg | ORAL_TABLET | Freq: Every evening | ORAL | Status: DC | PRN

## 2020-03-16 MED ORDER — LISINOPRIL 20 MG PO TABS
20.0000 mg | ORAL_TABLET | Freq: Every day | ORAL | Status: DC
  Administered 2020-03-16: 20 mg via ORAL
  Filled 2020-03-16 (×2): qty 1

## 2020-03-16 MED ORDER — LACTATED RINGERS IV SOLN: INTRAVENOUS | Status: DC

## 2020-03-16 MED ORDER — GABAPENTIN 100 MG PO CAPS
100.0000 mg | ORAL_CAPSULE | Freq: Three times a day (TID) | ORAL | Status: DC
  Administered 2020-03-16 – 2020-03-17 (×3): 100 mg via ORAL
  Filled 2020-03-16 (×3): qty 1

## 2020-03-16 MED ORDER — PROPOFOL 10 MG/ML IV BOLUS
INTRAVENOUS | Status: DC | PRN
  Administered 2020-03-16: 30 mg via INTRAVENOUS

## 2020-03-16 MED ORDER — PHENOL 1.4 % MT LIQD: 1.0000 | OROMUCOSAL | Status: DC | PRN

## 2020-03-16 MED ORDER — STERILE WATER FOR IRRIGATION IR SOLN
Status: DC | PRN
  Administered 2020-03-16: 2000 mL

## 2020-03-16 MED ORDER — ONDANSETRON HCL 4 MG/2ML IJ SOLN: 4.0000 mg | Freq: Once | INTRAMUSCULAR | Status: DC | PRN

## 2020-03-16 MED ORDER — AMLODIPINE BESYLATE 5 MG PO TABS
5.0000 mg | ORAL_TABLET | Freq: Every day | ORAL | Status: DC
  Filled 2020-03-16: qty 1

## 2020-03-16 MED ORDER — FENTANYL CITRATE (PF) 100 MCG/2ML IJ SOLN
50.0000 ug | INTRAMUSCULAR | Status: DC
  Administered 2020-03-16: 100 ug via INTRAVENOUS
  Filled 2020-03-16: qty 2

## 2020-03-16 MED ORDER — HYDROMORPHONE HCL 1 MG/ML IJ SOLN: 0.5000 mg | INTRAMUSCULAR | Status: DC | PRN

## 2020-03-16 MED ORDER — METOCLOPRAMIDE HCL 5 MG PO TABS: 5.0000 mg | ORAL_TABLET | Freq: Three times a day (TID) | ORAL | Status: DC | PRN

## 2020-03-16 MED ORDER — CEFAZOLIN SODIUM-DEXTROSE 2-4 GM/100ML-% IV SOLN
2.0000 g | Freq: Four times a day (QID) | INTRAVENOUS | Status: AC
  Administered 2020-03-16 (×2): 2 g via INTRAVENOUS
  Filled 2020-03-16 (×2): qty 100

## 2020-03-16 MED ORDER — VITAMIN D 25 MCG (1000 UNIT) PO TABS
5000.0000 [IU] | ORAL_TABLET | Freq: Every day | ORAL | Status: DC
  Administered 2020-03-16 – 2020-03-17 (×2): 5000 [IU] via ORAL
  Filled 2020-03-16 (×2): qty 5

## 2020-03-16 MED ORDER — SIMVASTATIN 40 MG PO TABS
40.0000 mg | ORAL_TABLET | Freq: Every day | ORAL | Status: DC
  Administered 2020-03-16: 40 mg via ORAL
  Filled 2020-03-16: qty 1

## 2020-03-16 MED ORDER — DOCUSATE SODIUM 100 MG PO CAPS
100.0000 mg | ORAL_CAPSULE | Freq: Two times a day (BID) | ORAL | Status: DC
  Administered 2020-03-16 – 2020-03-17 (×3): 100 mg via ORAL
  Filled 2020-03-16 (×3): qty 1

## 2020-03-16 MED ORDER — ALUM & MAG HYDROXIDE-SIMETH 200-200-20 MG/5ML PO SUSP: 30.0000 mL | ORAL | Status: DC | PRN

## 2020-03-16 MED ORDER — OXYCODONE HCL 5 MG PO TABS
5.0000 mg | ORAL_TABLET | ORAL | Status: DC | PRN
  Administered 2020-03-17: 10 mg via ORAL
  Filled 2020-03-16: qty 2

## 2020-03-16 MED ORDER — METHOCARBAMOL 500 MG PO TABS
500.0000 mg | ORAL_TABLET | Freq: Four times a day (QID) | ORAL | Status: DC | PRN
  Administered 2020-03-16 – 2020-03-17 (×3): 500 mg via ORAL
  Filled 2020-03-16 (×3): qty 1

## 2020-03-16 MED ORDER — ORAL CARE MOUTH RINSE: 15.0000 mL | Freq: Once | OROMUCOSAL | Status: AC

## 2020-03-16 MED ORDER — SODIUM CHLORIDE 0.9 % IV SOLN: INTRAVENOUS | Status: DC

## 2020-03-16 MED ORDER — KETOROLAC TROMETHAMINE 15 MG/ML IJ SOLN
15.0000 mg | Freq: Four times a day (QID) | INTRAMUSCULAR | Status: AC
  Administered 2020-03-16 – 2020-03-17 (×3): 15 mg via INTRAVENOUS
  Filled 2020-03-16 (×3): qty 1

## 2020-03-16 MED ORDER — METOCLOPRAMIDE HCL 5 MG/ML IJ SOLN: 5.0000 mg | Freq: Three times a day (TID) | INTRAMUSCULAR | Status: DC | PRN

## 2020-03-16 MED ORDER — MIDAZOLAM HCL 2 MG/2ML IJ SOLN
1.0000 mg | INTRAMUSCULAR | Status: DC
  Administered 2020-03-16: 2 mg via INTRAVENOUS
  Filled 2020-03-16: qty 2

## 2020-03-16 MED ORDER — PHENYLEPHRINE HCL-NACL 10-0.9 MG/250ML-% IV SOLN
INTRAVENOUS | Status: DC | PRN
  Administered 2020-03-16: 35 ug/min via INTRAVENOUS

## 2020-03-16 MED ORDER — ACETAMINOPHEN 325 MG PO TABS
325.0000 mg | ORAL_TABLET | Freq: Four times a day (QID) | ORAL | Status: DC | PRN
  Administered 2020-03-17: 650 mg via ORAL
  Filled 2020-03-16: qty 2

## 2020-03-16 SURGICAL SUPPLY — 55 items
APL SKNCLS STERI-STRIP NONHPOA (GAUZE/BANDAGES/DRESSINGS)
BAG SPEC THK2 15X12 ZIP CLS (MISCELLANEOUS)
BAG ZIPLOCK 12X15 (MISCELLANEOUS) IMPLANT
BENZOIN TINCTURE PRP APPL 2/3 (GAUZE/BANDAGES/DRESSINGS) IMPLANT
BLADE SAG 18X100X1.27 (BLADE) IMPLANT
BLADE SURG SZ10 CARB STEEL (BLADE) ×4 IMPLANT
BNDG ELASTIC 6X5.8 VLCR STR LF (GAUZE/BANDAGES/DRESSINGS) ×2 IMPLANT
BOWL SMART MIX CTS (DISPOSABLE) IMPLANT
BSPLAT TIB 5 KN TRITANIUM (Knees) ×1 IMPLANT
COVER SURGICAL LIGHT HANDLE (MISCELLANEOUS) ×2 IMPLANT
COVER WAND RF STERILE (DRAPES) IMPLANT
CUFF TOURN SGL QUICK 34 (TOURNIQUET CUFF) ×2
CUFF TRNQT CYL 34X4.125X (TOURNIQUET CUFF) ×1 IMPLANT
DECANTER SPIKE VIAL GLASS SM (MISCELLANEOUS) IMPLANT
DRAPE U-SHAPE 47X51 STRL (DRAPES) ×2 IMPLANT
DRSG PAD ABDOMINAL 8X10 ST (GAUZE/BANDAGES/DRESSINGS) ×4 IMPLANT
DURAPREP 26ML APPLICATOR (WOUND CARE) ×2 IMPLANT
ELECT REM PT RETURN 15FT ADLT (MISCELLANEOUS) ×2 IMPLANT
FEMORAL POSTERIOR SZ5 LFT (Femur) IMPLANT
GAUZE SPONGE 4X4 12PLY STRL (GAUZE/BANDAGES/DRESSINGS) ×2 IMPLANT
GAUZE XEROFORM 1X8 LF (GAUZE/BANDAGES/DRESSINGS) IMPLANT
GLOVE BIO SURGEON STRL SZ7.5 (GLOVE) ×2 IMPLANT
GLOVE BIOGEL PI IND STRL 8 (GLOVE) ×2 IMPLANT
GLOVE BIOGEL PI INDICATOR 8 (GLOVE) ×2
GLOVE ECLIPSE 8.0 STRL XLNG CF (GLOVE) ×2 IMPLANT
GOWN STRL REUS W/TWL XL LVL3 (GOWN DISPOSABLE) ×4 IMPLANT
HANDPIECE INTERPULSE COAX TIP (DISPOSABLE) ×2
HOLDER FOLEY CATH W/STRAP (MISCELLANEOUS) IMPLANT
IMMOBILIZER KNEE 20 (SOFTGOODS) ×2
IMMOBILIZER KNEE 20 THIGH 36 (SOFTGOODS) ×1 IMPLANT
INSERT TBL BEARNG SZ5 10 KNEE (Miscellaneous) ×1 IMPLANT
KIT TURNOVER KIT A (KITS) IMPLANT
KNEE PATELLA ASYMMETRIC 10X35 (Knees) ×1 IMPLANT
KNEE TIBIAL COMPONENT SZ5 (Knees) ×1 IMPLANT
NS IRRIG 1000ML POUR BTL (IV SOLUTION) ×2 IMPLANT
PACK TOTAL KNEE CUSTOM (KITS) ×2 IMPLANT
PADDING CAST COTTON 6X4 STRL (CAST SUPPLIES) ×4 IMPLANT
PENCIL SMOKE EVACUATOR (MISCELLANEOUS) IMPLANT
PIN FLUTED HEDLESS FIX 3.5X1/8 (PIN) ×1 IMPLANT
POSTERIOR FEMORAL SZ5 LFT (Femur) ×2 IMPLANT
PROTECTOR NERVE ULNAR (MISCELLANEOUS) ×2 IMPLANT
SET HNDPC FAN SPRY TIP SCT (DISPOSABLE) ×1 IMPLANT
SET PAD KNEE POSITIONER (MISCELLANEOUS) ×2 IMPLANT
STAPLER VISISTAT 35W (STAPLE) IMPLANT
STRIP CLOSURE SKIN 1/2X4 (GAUZE/BANDAGES/DRESSINGS) IMPLANT
SUT MNCRL AB 4-0 PS2 18 (SUTURE) IMPLANT
SUT VIC AB 0 CT1 27 (SUTURE) ×4
SUT VIC AB 0 CT1 27XBRD ANTBC (SUTURE) ×1 IMPLANT
SUT VIC AB 1 CT1 36 (SUTURE) ×4 IMPLANT
SUT VIC AB 2-0 CT1 27 (SUTURE) ×4
SUT VIC AB 2-0 CT1 TAPERPNT 27 (SUTURE) ×2 IMPLANT
TRAY FOLEY MTR SLVR 16FR STAT (SET/KITS/TRAYS/PACK) ×2 IMPLANT
WATER STERILE IRR 1000ML POUR (IV SOLUTION) ×2 IMPLANT
WRAP KNEE MAXI GEL POST OP (GAUZE/BANDAGES/DRESSINGS) ×2 IMPLANT
YANKAUER SUCT BULB TIP 10FT TU (MISCELLANEOUS) ×2 IMPLANT

## 2020-03-16 NOTE — Progress Notes (Signed)
AssistedDr. Carolyn Witman with left, ultrasound guided, adductor canal block. Side rails up, monitors on throughout procedure. See vital signs in flow sheet. Tolerated Procedure well.  

## 2020-03-16 NOTE — Anesthesia Procedure Notes (Signed)
Spinal  Patient location during procedure: OR Start time: 03/16/2020 10:08 AM Staffing Performed: resident/CRNA  Resident/CRNA: [place] Cloris Flippo C, CRNA Preanesthetic Checklist Completed: patient identified, IV checked, site marked, risks and benefits discussed, surgical consent, monitors and equipment checked, pre-op evaluation and timeout performed Spinal Block Patient position: sitting Prep: DuraPrep and site prepped and draped Patient monitoring: heart rate, cardiac monitor, continuous pulse ox and blood pressure Approach: midline Location: L3-4 Injection technique: single-shot Needle Needle type: Pencan  Needle gauge: 24 G Needle length: 9 cm Assessment Sensory level: T4 Additional Notes IV functioning, monitors applied to pt. Expiration date of kit checked and confirmed to be in date. Sterile prep and drape, hand hygiene and sterile gloved used. Pt was positioned and spine was prepped in sterile fashion. Skin was anesthetized with lidocaine. Free flow of clear CSF obtained prior to injecting local anesthetic into CSF x 1 attempt. Spinal needle aspirated freely following injection. Needle was carefully withdrawn, and pt tolerated procedure well. Loss of motor and sensory on exam post injection.

## 2020-03-16 NOTE — Anesthesia Postprocedure Evaluation (Signed)
Anesthesia Post Note  Patient: WILMONT OLUND  Procedure(s) Performed: LEFT TOTAL KNEE ARTHROPLASTY (Left Knee)     Patient location during evaluation: PACU Anesthesia Type: Spinal Level of consciousness: oriented and awake and alert Pain management: pain level controlled Vital Signs Assessment: post-procedure vital signs reviewed and stable Respiratory status: spontaneous breathing, respiratory function stable and nonlabored ventilation Cardiovascular status: blood pressure returned to baseline and stable Postop Assessment: no headache, no backache, no apparent nausea or vomiting and spinal receding Anesthetic complications: no   No complications documented.  Last Vitals:  Vitals:   03/16/20 1458 03/16/20 1553  BP: 121/75 121/75  Pulse: 64 70  Resp: 13 15  Temp: (!) 36.3 C (!) 36.4 C  SpO2: 97% 97%    Last Pain:  Vitals:   03/16/20 1553  TempSrc: Oral  PainSc:                  Lidia Collum

## 2020-03-16 NOTE — Brief Op Note (Signed)
03/16/2020  11:53 AM  PATIENT:  Randye Lobo Mallick  54 y.o. male  PRE-OPERATIVE DIAGNOSIS:  left knee osteoarthritis  POST-OPERATIVE DIAGNOSIS:  left knee osteoarthritis  PROCEDURE:  Procedure(s): LEFT TOTAL KNEE ARTHROPLASTY (Left)  SURGEON:  Surgeon(s) and Role:    Mcarthur Rossetti, MD - Primary  PHYSICIAN ASSISTANT: Benita Stabile, PA-C  ANESTHESIA:   local, regional and spinal  EBL:  50 mL   COUNTS:  YES  TOURNIQUET:   Total Tourniquet Time Documented: Thigh (Left) - 62 minutes Total: Thigh (Left) - 62 minutes   DICTATION: .Other Dictation: Dictation Number [id number]  PLAN OF CARE: Admit for overnight observation  PATIENT DISPOSITION:  PACU - hemodynamically stable.   Delay start of Pharmacological VTE agent (>24hrs) due to surgical blood loss or risk of bleeding: no

## 2020-03-16 NOTE — Anesthesia Preprocedure Evaluation (Addendum)
Anesthesia Evaluation  Patient identified by MRN, date of birth, ID band Patient awake    Reviewed: Allergy & Precautions, NPO status , Patient's Chart, lab work & pertinent test results  History of Anesthesia Complications Negative for: history of anesthetic complications  Airway Mallampati: III  TM Distance: >3 FB Neck ROM: Full    Dental  (+) Teeth Intact   Pulmonary neg pulmonary ROS,    Pulmonary exam normal        Cardiovascular hypertension, Normal cardiovascular exam     Neuro/Psych negative psych ROS   GI/Hepatic negative GI ROS, Neg liver ROS,   Endo/Other  negative endocrine ROS  Renal/GU negative Renal ROS  negative genitourinary   Musculoskeletal  (+) Arthritis , Osteoarthritis,    Abdominal   Peds  Hematology negative hematology ROS (+)   Anesthesia Other Findings   Reproductive/Obstetrics                            Anesthesia Physical Anesthesia Plan  ASA: II  Anesthesia Plan: Spinal   Post-op Pain Management:    Induction:   PONV Risk Score and Plan: 1 and Propofol infusion, Treatment may vary due to age or medical condition, Ondansetron and TIVA  Airway Management Planned: Nasal Cannula and Simple Face Mask  Additional Equipment: None  Intra-op Plan:   Post-operative Plan:   Informed Consent: I have reviewed the patients History and Physical, chart, labs and discussed the procedure including the risks, benefits and alternatives for the proposed anesthesia with the patient or authorized representative who has indicated his/her understanding and acceptance.       Plan Discussed with:   Anesthesia Plan Comments:        Anesthesia Quick Evaluation

## 2020-03-16 NOTE — Interval H&P Note (Signed)
History and Physical Interval Note: The patient understands he is here for a left total knee arthroplasty to treat the pain from his osteoarthritis of the left knee.  There has been no acute changes in medical status.  See recent H&P.  The risk and benefits of surgery been explained in detail.  The left knee has been marked and informed consent is obtained.  03/16/2020 8:41 AM  Victor Conner  has presented today for surgery, with the diagnosis of left knee osteoarthritis.  The various methods of treatment have been discussed with the patient and family. After consideration of risks, benefits and other options for treatment, the patient has consented to  Procedure(s): LEFT TOTAL KNEE ARTHROPLASTY (Left) as a surgical intervention.  The patient's history has been reviewed, patient examined, no change in status, stable for surgery.  I have reviewed the patient's chart and labs.  Questions were answered to the patient's satisfaction.     Mcarthur Rossetti

## 2020-03-16 NOTE — Op Note (Signed)
NAME: Victor Conner, Victor Conner MEDICAL RECORD MP:[id number] ACCOUNT [id number] DATE OF BIRTH:1966-08-26 FACILITY: [facility] LOCATION: [facility] PHYSICIAN:Avrum Kimball Kerry Fort, MD  OPERATIVE REPORT  DATE OF PROCEDURE:  03/16/2020  PREOPERATIVE DIAGNOSIS:  Primary osteoarthritis and degenerative joint disease, left knee.  POSTOPERATIVE DIAGNOSIS:  Primary osteoarthritis and degenerative joint disease, left knee.  PROCEDURE:  Left total knee arthroplasty.  IMPLANTS:  [employer]ker Triathlon press-fit knee system with size 5 femur, size 5 tibial tray, 10 mm fixed bearing polyethylene insert, size 35 press-fit patellar button.  SURGEON:  Lind Guest. Ninfa Linden, MD  ASSISTANT:  Erskine Emery, PA-C  ANESTHESIA: 1.  Left lower extremity adductor canal block. 2.  Spinal. 3.  Local with 0.25% plain Marcaine.  ANTIBIOTICS:  3 g IV Ancef.  COMPLICATIONS:  None.  INDICATIONS:  This patient is a 54 year old gentleman well known to me.  He has well documented end-stage arthritis involving his left knee.  He has tried and failed all forms of conservative treatment.  At this point, his left knee pain is daily and is  detrimentally affecting his mobility, his quality of life, and his activities of daily living to the point he does wish to proceed with a total knee arthroplasty.  He is fully aware of the risk of acute blood loss anemia, nerve or vessel injury,  fracture, infection, DVT, and implant failure.  He understands our goals are to decrease pain, improve mobility, and overall improve quality of life.  DESCRIPTION OF PROCEDURE:  After informed consent was obtained and appropriate left knee was marked an adductor canal block was obtained of the left lower extremity in the holding room.  He was then brought to the operating room and sat up on the  operating table where spinal anesthesia was obtained.  He was laid in supine position.  A Foley catheter was placed and nonsterile tourniquet was placed around his  upper left thigh.  His left thigh, knee, leg, ankle and foot were prepped and draped with  DuraPrep and sterile drapes including a sterile stockinette.  A time-out was called.  He was identified as correct patient, correct left knee.  I then used an Esmarch to wrap that leg and tourniquet was inflated to 300 mm of pressure.  We then made a  direct midline incision over the patella and carried this proximally and distally.  We dissected down the knee joint and carried out a medial parapatellar arthrotomy finding a large joint effusion and significant periarticular osteophytes throughout the  knee and complete loss of the cartilage of the medial compartment of the knee.  There was slight varus malalignment as well.  With the knee in a flexed position, we removed remnants of ACL, PCL, medial and lateral meniscus as well as periarticular  osteophytes.  We then used the extramedullary guide for making our proximal tibia cut, correcting for varus and valgus in a neutral slope and taking 9 mm off the high side.  We made this cut without difficulty.  We then used an intramedullary cutting  guide for the femur going through the notch of the femur, setting this for a 10 mm distal femoral cut at 5 degrees externally rotated for left knee.  We made this cut without difficulty and brought the knee back down to full extension with a 9 mm  extension block and achieved full extension.  We then went back to the femur and put our femoral sizing guide based off the epicondylar axis and Whiteside's line.  Based off of this we chose  a size 5 femur.  We put a 4-in-1 cutting block for a size 5  femur, made our anterior and posterior cuts, followed by our chamfer cuts.  We then made our femoral box cut.  Attention was then turned back to the tibia.  We chose a size 5 tibial tray for coverage setting the rotation off the femur and the tibial  tubercle.  We did our keel punch off of this.  With a size 5 femur, followed by the 5  tibia, we trialed a 9 mm fixed bearing polyethylene insert.  We then made our patellar cut and drilled 3 holes for a size 35 press-fit patellar button.  With all trial  instrumentation in the knee we put the knee through range of motion and it was stable, but I did feel like we were going to stretch his ligaments a little bit with this size.  We went up 1 mm in poly size.  We then removed all instrumentation of the knee  and did the final tibial preparation.  We then irrigated the knee with normal saline solution using pulsatile lavage.  We dried the knee real well and I inserted our arthrotomy Marcaine that was plain Marcaine.  We then placed our real press-fit tibial  tray size 5 followed by real size 5 left press-fit femur.  We placed our real 10 mm fixed bearing polyethylene insert and press-fit our size 35 patellar button.  We then put the knee through several cycles of motion.  We were pleased with range of motion  and stability.  We then let the tourniquet down.  Hemostasis obtained with electrocautery.  We then closed the arthrotomy with interrupted #1 Vicryl suture followed by closing the deep tissue was 0 Vicryl, 2-0 Vicryl was used to close subcutaneous  tissue, and the skin was reapproximated with staples.  Xeroform and well-padded sterile dressing was applied.  He was taken to recovery room in stable condition.  All final counts were correct.  There were no complications noted.  Of note, Benita Stabile,  PA-C, assisted the entire case and his assistance was appreciated through the entirety of the case.  CN/NUANCE  D:03/16/2020 T:03/16/2020 JOB:[id number]

## 2020-03-16 NOTE — Transfer of Care (Signed)
Immediate Anesthesia Transfer of Care Note  Patient: Victor Conner  Procedure(s) Performed: LEFT TOTAL KNEE ARTHROPLASTY (Left Knee)  Patient Location: PACU  Anesthesia Type:Spinal  Level of Consciousness: awake, alert  and oriented  Airway & Oxygen Therapy: Patient Spontanous Breathing and Patient connected to face mask oxygen  Post-op Assessment: Report given to RN and Post -op Vital signs reviewed and stable  Post vital signs: Reviewed and stable  Last Vitals:  Vitals Value Taken Time  BP 120/75 03/16/20 1220  Temp    Pulse 69 03/16/20 1225  Resp 12 03/16/20 1225  SpO2 99 % 03/16/20 1225  Vitals shown include unvalidated device data.  Last Pain:  Vitals:   03/16/20 1220  TempSrc:   PainSc: (P) 0-No pain      Patients Stated Pain Goal: 4 (59/74/71 8550)  Complications: No complications documented.

## 2020-03-16 NOTE — Anesthesia Procedure Notes (Signed)
Anesthesia Regional Block: Adductor canal block   Pre-Anesthetic Checklist: ,, timeout performed, Correct Patient, Correct Site, Correct Laterality, Correct Procedure, Correct Position, site marked, Risks and benefits discussed,  Surgical consent,  Pre-op evaluation,  At surgeon's request and post-op pain management  Laterality: Left  Prep: chloraprep       Needles:  Injection technique: Single-shot  Needle Type: Echogenic Stimulator Needle     Needle Length: 10cm  Needle Gauge: 20     Additional Needles:   Procedures:,,,, ultrasound used (permanent image in chart),,,,  Narrative:  Start time: 03/16/2020 9:25 AM End time: 03/16/2020 9:30 AM Injection made incrementally with aspirations every 5 mL.  Performed by: Personally  Anesthesiologist: Lidia Collum, MD  Additional Notes: Standard monitors applied. Skin prepped. Good needle visualization with ultrasound. Injection made in 5cc increments with no resistance to injection. Patient tolerated the procedure well.

## 2020-03-16 NOTE — Evaluation (Signed)
Physical Therapy Evaluation Patient Details Name: Victor Conner MRN: [id number] DOB: 02/24/1966 Today's Date: 03/16/2020   History of Present Illness  Pt s/p L TKR and with hx of cervical fusion  Clinical Impression  Pt s/p L TK and presents with decreased L LE strength/ROM and post op pain limiting functional mobility.  Pt should progress to dc home with family assist.    Follow Up Recommendations Follow surgeon's recommendation for DC plan and follow-up therapies    Equipment Recommendations  Rolling walker with 5" wheels;3in1 (PT)    Recommendations for Other Services       Precautions / Restrictions Precautions Precautions: Fall;Knee Required Braces or Orthoses: Knee Immobilizer - Left Knee Immobilizer - Left: Discontinue once straight leg raise with < 10 degree lag Restrictions Weight Bearing Restrictions: No Other Position/Activity Restrictions: WBAT      Mobility  Bed Mobility Overal bed mobility: Needs Assistance Bed Mobility: Supine to Sit     Supine to sit: Min assist     General bed mobility comments: Cues for sequence and use of R LE to self assist;  Physical assist to manage L LE  Transfers Overall transfer level: Needs assistance Equipment used: Rolling walker (2 wheeled) Transfers: Sit to/from Stand Sit to Stand: Min assist         General transfer comment: cues for LE management and use of UEs to self assist  Ambulation/Gait Ambulation/Gait assistance: Min assist;Min guard Gait Distance (Feet): 85 Feet Assistive device: Rolling walker (2 wheeled) Gait Pattern/deviations: Step-to pattern;Decreased step length - right;Decreased step length - left;Shuffle;Trunk flexed Gait velocity: decr   General Gait Details: cues for posture, position from RW and sequence  Stairs            Wheelchair Mobility    Modified Rankin (Stroke Patients Only)       Balance Overall balance assessment: Needs assistance Sitting-balance support: No  upper extremity supported;Feet supported Sitting balance-Leahy Scale: Good     Standing balance support: Bilateral upper extremity supported Standing balance-Leahy Scale: Poor                               Pertinent Vitals/Pain Pain Assessment: 0-10 Pain Score: 2  Pain Location: L knee Pain Descriptors / Indicators: Sore Pain Intervention(s): Limited activity within patient's tolerance;Premedicated before session;Monitored during session;Ice applied    Home Living Family/patient expects to be discharged to:: Private residence Living Arrangements: Spouse/significant other;Children Available Help at Discharge: Family;Available 24 hours/day Type of Home: House Home Access: Stairs to enter Entrance Stairs-Rails: Right;Left Entrance Stairs-Number of Steps: 1, or 3` Home Layout: One level Home Equipment: Shower seat;Other (comment)      Prior Function Level of Independence: Independent         Comments: working, IT trainer work     Journalist, newspaper   Dominant Hand: Left    Extremity/Trunk Assessment   Upper Extremity Assessment Upper Extremity Assessment: Overall WFL for tasks assessed    Lower Extremity Assessment Lower Extremity Assessment: LLE deficits/detail    Cervical / Trunk Assessment Cervical / Trunk Assessment: Normal  Communication   Communication: No difficulties  Cognition Arousal/Alertness: Awake/alert Behavior During Therapy: WFL for tasks assessed/performed Overall Cognitive Status: Within Functional Limits for tasks assessed  General Comments      Exercises Total Joint Exercises Ankle Circles/Pumps: AROM;Both;15 reps;Supine Straight Leg Raises: AAROM;Left;5 reps;Supine   Assessment/Plan    PT Assessment Patient needs continued PT services  PT Problem List Decreased strength;Decreased range of motion;Decreased activity tolerance;Decreased balance;Decreased mobility;Decreased  knowledge of use of DME;Pain       PT Treatment Interventions DME instruction;Stair training;Gait training;Functional mobility training;Therapeutic activities;Therapeutic exercise;Patient/family education    PT Goals (Current goals can be found in the Care Plan section)  Acute Rehab PT Goals Patient Stated Goal: Regain IND PT Goal Formulation: With patient Time For Goal Achievement: 03/23/20 Potential to Achieve Goals: Good    Frequency 7X/week   Barriers to discharge        Co-evaluation               AM-PAC PT "6 Clicks" Mobility  Outcome Measure Help needed turning from your back to your side while in a flat bed without using bedrails?: A Little Help needed moving from lying on your back to sitting on the side of a flat bed without using bedrails?: A Little Help needed moving to and from a bed to a chair (including a wheelchair)?: A Little Help needed standing up from a chair using your arms (e.g., wheelchair or bedside chair)?: A Little Help needed to walk in hospital room?: A Little Help needed climbing 3-5 steps with a railing? : A Lot 6 Click Score: 17    End of Session Equipment Utilized During Treatment: Gait belt;Left knee immobilizer Activity Tolerance: Patient tolerated treatment well Patient left: in chair;with call bell/phone within reach;with chair alarm set;with family/visitor present Nurse Communication: Mobility status PT Visit Diagnosis: Unsteadiness on feet (R26.81);Difficulty in walking, not elsewhere classified (R26.2)    Time: 0156-1537 PT Time Calculation (min) (ACUTE ONLY): 32 min   Charges:   PT Evaluation $PT Eval Low Complexity: 1 Low PT Treatments $Gait Training: 8-22 mins        [place] Pager [phone] Office [phone]   Siegfried Vieth 03/16/2020, 5:19 PM

## 2020-03-17 DIAGNOSIS — Z79899 Other long term (current) drug therapy: Secondary | ICD-10-CM | POA: Diagnosis not present

## 2020-03-17 DIAGNOSIS — Z885 Allergy status to narcotic agent status: Secondary | ICD-10-CM | POA: Diagnosis not present

## 2020-03-17 DIAGNOSIS — Z91013 Allergy to seafood: Secondary | ICD-10-CM | POA: Diagnosis not present

## 2020-03-17 DIAGNOSIS — I10 Essential (primary) hypertension: Secondary | ICD-10-CM | POA: Diagnosis not present

## 2020-03-17 DIAGNOSIS — E7800 Pure hypercholesterolemia, unspecified: Secondary | ICD-10-CM | POA: Diagnosis not present

## 2020-03-17 DIAGNOSIS — M1712 Unilateral primary osteoarthritis, left knee: Secondary | ICD-10-CM | POA: Diagnosis not present

## 2020-03-17 DIAGNOSIS — Z981 Arthrodesis status: Secondary | ICD-10-CM | POA: Diagnosis not present

## 2020-03-17 DIAGNOSIS — Z7982 Long term (current) use of aspirin: Secondary | ICD-10-CM | POA: Diagnosis not present

## 2020-03-17 LAB — CBC
HCT: 40.3 % (ref 39.0–52.0)
Hemoglobin: 12.8 g/dL — ABNORMAL LOW (ref 13.0–17.0)
MCH: 30.7 pg (ref 26.0–34.0)
MCHC: 31.8 g/dL (ref 30.0–36.0)
MCV: 96.6 fL (ref 80.0–100.0)
Platelets: 189 10*3/uL (ref 150–400)
RBC: 4.17 MIL/uL — ABNORMAL LOW (ref 4.22–5.81)
RDW: 13.5 % (ref 11.5–15.5)
WBC: 20 10*3/uL — ABNORMAL HIGH (ref 4.0–10.5)
nRBC: 0 % (ref 0.0–0.2)

## 2020-03-17 MED ORDER — OXYCODONE HCL 5 MG PO TABS: 5.0000 mg | ORAL_TABLET | ORAL | 0 refills | Status: DC | PRN

## 2020-03-17 MED ORDER — ASPIRIN 81 MG PO CHEW: 81.0000 mg | CHEWABLE_TABLET | Freq: Two times a day (BID) | ORAL | 0 refills | Status: DC

## 2020-03-17 MED ORDER — METHOCARBAMOL 500 MG PO TABS: 500.0000 mg | ORAL_TABLET | Freq: Four times a day (QID) | ORAL | 1 refills | Status: DC | PRN

## 2020-03-17 NOTE — Progress Notes (Signed)
Physical Therapy Treatment Patient Details Name: Victor Conner MRN: [id number] DOB: [birth year] Today's Date: 03/17/2020    History of Present Illness Pt s/p L TKR and with hx of cervical fusion    PT Comments    Pt motivated and progressing well with mobility.  Pt ambulated increased distance in hall, reviewed stairs and performed HEP with assist - written instruction provided and reviewed.  Pt eager for dc home.  Follow Up Recommendations  Follow surgeon's recommendation for DC plan and follow-up therapies     Equipment Recommendations  Rolling walker with 5" wheels;3in1 (PT)    Recommendations for Other Services       Precautions / Restrictions Precautions Precautions: Fall;Knee Required Braces or Orthoses: Knee Immobilizer - Left Knee Immobilizer - Left: Discontinue once straight leg raise with < 10 degree lag (Pt performed IND SLR this date) Restrictions Weight Bearing Restrictions: No Other Position/Activity Restrictions: WBAT    Mobility  Bed Mobility Overal bed mobility: Needs Assistance Bed Mobility: Supine to Sit     Supine to sit: Supervision        Transfers Overall transfer level: Needs assistance Equipment used: Rolling walker (2 wheeled) Transfers: Sit to/from Stand Sit to Stand: Supervision         General transfer comment: cues for LE management and use of UEs to self assist  Ambulation/Gait Ambulation/Gait assistance: Min guard;Supervision Gait Distance (Feet): 200 Feet Assistive device: Rolling walker (2 wheeled) Gait Pattern/deviations: Decreased step length - right;Decreased step length - left;Shuffle;Trunk flexed;Step-to pattern;Step-through pattern;Antalgic Gait velocity: decr   General Gait Details: cues for posture, position from RW and sequence   Stairs Stairs: Yes Stairs assistance: Min guard Stair Management: Two rails;Step to pattern;Forwards Number of Stairs: 5 General stair comments: cues for sequence   Wheelchair  Mobility    Modified Rankin (Stroke Patients Only)       Balance Overall balance assessment: Needs assistance Sitting-balance support: No upper extremity supported;Feet supported Sitting balance-Leahy Scale: Good     Standing balance support: No upper extremity supported Standing balance-Leahy Scale: Fair                              Cognition Arousal/Alertness: Awake/alert Behavior During Therapy: WFL for tasks assessed/performed Overall Cognitive Status: Within Functional Limits for tasks assessed                                        Exercises Total Joint Exercises Ankle Circles/Pumps: AROM;Both;15 reps;Supine Quad Sets: AROM;Both;10 reps;Supine Heel Slides: AAROM;Left;15 reps;Supine Straight Leg Raises: AAROM;AROM;Left;20 reps;Supine Goniometric ROM: AAROM L knee -7 - 80    General Comments        Pertinent Vitals/Pain Pain Assessment: 0-10 Pain Score: 2  Pain Location: L knee Pain Descriptors / Indicators: Sore Pain Intervention(s): Limited activity within patient's tolerance;Monitored during session;Premedicated before session;Ice applied    Home Living                      Prior Function            PT Goals (current goals can now be found in the care plan section) Acute Rehab PT Goals Patient Stated Goal: Regain IND PT Goal Formulation: With patient Time For Goal Achievement: 03/23/20 Potential to Achieve Goals: Good Progress towards PT goals: Progressing toward goals    Frequency  7X/week      PT Plan Current plan remains appropriate    Co-evaluation              AM-PAC PT "6 Clicks" Mobility   Outcome Measure  Help needed turning from your back to your side while in a flat bed without using bedrails?: A Little Help needed moving from lying on your back to sitting on the side of a flat bed without using bedrails?: A Little Help needed moving to and from a bed to a chair (including a  wheelchair)?: A Little Help needed standing up from a chair using your arms (e.g., wheelchair or bedside chair)?: A Little Help needed to walk in hospital room?: A Little Help needed climbing 3-5 steps with a railing? : A Little 6 Click Score: 18    End of Session Equipment Utilized During Treatment: Gait belt Activity Tolerance: Patient tolerated treatment well Patient left: in chair;with call bell/phone within reach;with chair alarm set;with family/visitor present Nurse Communication: Mobility status PT Visit Diagnosis: Unsteadiness on feet (R26.81);Difficulty in walking, not elsewhere classified (R26.2)     Time: 1497-0263 PT Time Calculation (min) (ACUTE ONLY): 47 min  Charges:  $Gait Training: 8-22 mins $Therapeutic Exercise: 8-22 mins $Therapeutic Activity: 8-22 mins                     Debe Coder PT Acute Rehabilitation Services Pager [phone] Office [phone]    Ronald Vinsant 03/17/2020, 12:46 PM

## 2020-03-17 NOTE — Discharge Summary (Signed)
Patient ID: Victor Conner MRN: [id number] DOB/AGE: 1966/04/18 54 y.o.  Admit date: 03/16/2020 Discharge date: 03/17/2020  Admission Diagnoses:  Principal Problem:   Unilateral primary osteoarthritis, left knee Active Problems:   Status post total left knee replacement   Discharge Diagnoses:  Same  Past Medical History:  Diagnosis Date  . Arthritis   . Hypercholesteremia   . Hypertension     Surgeries: Procedure(s): LEFT TOTAL KNEE ARTHROPLASTY on 03/16/2020   Consultants:   Discharged Condition: Improved  Hospital Course: MOSIAH BASTIN is an 54 y.o. male who was admitted 03/16/2020 for operative treatment ofUnilateral primary osteoarthritis, left knee. Patient has severe unremitting pain that affects sleep, daily activities, and work/hobbies. After pre-op clearance the patient was taken to the operating room on 03/16/2020 and underwent  Procedure(s): LEFT TOTAL KNEE ARTHROPLASTY.    Patient was given perioperative antibiotics:  Anti-infectives (From admission, onward)   Start     Dose/Rate Route Frequency Ordered Stop   03/16/20 1600  ceFAZolin (ANCEF) IVPB 2g/100 mL premix        2 g 200 mL/hr over 30 Minutes Intravenous Every 6 hours 03/16/20 1409 03/16/20 2157   03/16/20 0600  ceFAZolin (ANCEF) 3 g in dextrose 5 % 50 mL IVPB        3 g 100 mL/hr over 30 Minutes Intravenous On call to O.R. 03/15/20 0736 03/16/20 1040       Patient was given sequential compression devices, early ambulation, and chemoprophylaxis to prevent DVT.  Patient benefited maximally from hospital stay and there were no complications.    Recent vital signs:  Patient Vitals for the past 24 hrs:  BP Temp Temp src Pulse Resp SpO2  03/17/20 1035 102/65 98.7 F (37.1 C) -- 70 17 95 %  03/17/20 0526 (!) 99/55 97.7 F (36.5 C) Oral 66 14 94 %  03/17/20 0151 100/60 97.8 F (36.6 C) Oral 69 16 93 %  03/16/20 2233 114/61 97.9 F (36.6 C) Oral 78 16 97 %  03/16/20 1706 113/73 97.7 F (36.5 C)  Oral 77 14 94 %  03/16/20 1553 121/75 (!) 97.5 F (36.4 C) Oral 70 15 97 %  03/16/20 1458 121/75 (!) 97.4 F (36.3 C) -- 64 13 97 %  03/16/20 1406 124/83 98 F (36.7 C) Oral 60 15 98 %  03/16/20 1345 133/83 98.2 F (36.8 C) -- 62 14 98 %  03/16/20 1330 124/78 -- -- 61 12 99 %  03/16/20 1315 115/78 -- -- (!) 59 12 98 %  03/16/20 1300 113/80 -- -- 62 (!) 9 98 %  03/16/20 1245 122/78 -- -- 63 10 100 %  03/16/20 1230 117/75 -- -- 64 14 99 %  03/16/20 1220 120/75 97.6 F (36.4 C) -- 84 16 98 %     Recent laboratory studies:  Recent Labs    03/17/20 0330  WBC 20.0*  HGB 12.8*  HCT 40.3  PLT 189     Discharge Medications:   Allergies as of 03/17/2020      Reactions   Morphine And Related Itching   Shrimp [shellfish Allergy] Swelling   Eyes/Throat swelling      Medication List    STOP taking these medications   naproxen sodium 220 MG tablet Commonly known as: ALEVE     TAKE these medications   amLODipine 10 MG tablet Commonly known as: NORVASC Take 5 mg by mouth daily.   aspirin 81 MG chewable tablet Chew 1 tablet (81 mg total) by  mouth 2 (two) times daily.   esomeprazole 20 MG capsule Commonly known as: NEXIUM Take 40 mg by mouth daily as needed (acid reflux/indigestion.).   lisinopril 20 MG tablet Commonly known as: ZESTRIL Take 20 mg by mouth daily.   methocarbamol 500 MG tablet Commonly known as: ROBAXIN Take 1 tablet (500 mg total) by mouth every 6 (six) hours as needed for muscle spasms.   multivitamin with minerals tablet Take 1 tablet by mouth 2 (two) times a week.   oxyCODONE 5 MG immediate release tablet Commonly known as: Oxy IR/ROXICODONE Take 1-2 tablets (5-10 mg total) by mouth every 4 (four) hours as needed for moderate pain (pain score 4-6).   simvastatin 40 MG tablet Commonly known as: ZOCOR Take 40 mg by mouth at bedtime.   Vitamin D-3 125 MCG (5000 UT) Tabs Take 5,000 Units by mouth daily.            Durable Medical  Equipment  (From admission, onward)         Start     Ordered   03/16/20 1410  DME 3 n 1  Once        03/16/20 1409   03/16/20 1410  DME Walker rolling  Once       Question Answer Comment  Walker: With 5 Inch Wheels   Patient needs a walker to treat with the following condition Status post total left knee replacement      03/16/20 1409          Diagnostic Studies: DG Knee Left Port  Result Date: 03/16/2020 CLINICAL DATA:  Total left knee replacement. EXAM: PORTABLE LEFT KNEE - 1-2 VIEW COMPARISON:  No prior. FINDINGS: Total left knee replacement. Hardware intact. Anatomic alignment. No acute bony abnormality identified. IMPRESSION: Total left knee replacement with anatomic alignment. Electronically Signed   By: [place]   On: 03/16/2020 14:30    Disposition: Discharge disposition: 01-[facility] [place]    Mcarthur Rossetti, MD Follow up in 2 week(s).   Specialty: Orthopedic Surgery Contact information: [address] [place] [address] [phone]                Signed: Mcarthur Rossetti 03/17/2020, 11:09 AM

## 2020-03-17 NOTE — Progress Notes (Signed)
Subjective: 1 Day Post-Op Procedure(s) (LRB): LEFT TOTAL KNEE ARTHROPLASTY (Left) Patient reports pain as moderate.    Objective: Vital signs in last 24 hours: Temp:  [97.4 F (36.3 C)-98.7 F (37.1 C)] 98.7 F (37.1 C) (06/26 1035) Pulse Rate:  [59-84] 70 (06/26 1035) Resp:  [9-17] 17 (06/26 1035) BP: (99-133)/(55-83) 102/65 (06/26 1035) SpO2:  [93 %-100 %] 95 % (06/26 1035)  Intake/Output from previous day: 06/25 0701 - 06/26 0700 In: 3059.8 [P.O.:570; I.V.:2139.8; IV Piggyback:350] Out: 2700 [Urine:2650; Blood:50] Intake/Output this shift: No intake/output data recorded.  Recent Labs    03/17/20 0330  HGB 12.8*   Recent Labs    03/17/20 0330  WBC 20.0*  RBC 4.17*  HCT 40.3  PLT 189   No results for input(s): NA, K, CL, CO2, BUN, CREATININE, GLUCOSE, CALCIUM in the last 72 hours. No results for input(s): LABPT, INR in the last 72 hours.  Sensation intact distally Intact pulses distally Dorsiflexion/Plantar flexion intact Incision: dressing C/D/I No cellulitis present Compartment soft   Assessment/Plan: 1 Day Post-Op Procedure(s) (LRB): LEFT TOTAL KNEE ARTHROPLASTY (Left) Up with therapy Discharge home with home health this afternoon.    Patient's anticipated LOS is less than 2 midnights, meeting these requirements: - Younger than 109 - Lives within 1 hour of care - Has a competent adult at home to recover with post-op recover - NO history of  - Chronic pain requiring opiods  - Diabetes  - Coronary Artery Disease  - Heart failure  - Heart attack  - Stroke  - DVT/VTE  - Cardiac arrhythmia  - Respiratory Failure/COPD  - Renal failure  - Anemia  - Advanced Liver disease       Victor Conner 03/17/2020, 11:05 AM

## 2020-03-17 NOTE — Plan of Care (Signed)
Plan of care reviewed and discussed with the patient. 

## 2020-03-17 NOTE — TOC Initial Note (Signed)
Transition of Care [facility]) - Initial/Assessment Note    Patient Details  Name: Victor Conner MRN: [id number] Date of Birth: 1966/04/08  Transition of Care [facility]) CM/SW Contact:    Joaquin Courts, RN Phone Number: 03/17/2020, 11:15 AM  Clinical Narrative:        [facility] to provide HHPT services.  [employer] to deliver rolling walker and 3in1 to bedside for home use.              Expected Discharge Plan: [place] Barriers to Discharge: No Barriers Identified   Patient Goals and CMS Choice Patient states their goals for this hospitalization and ongoing recovery are:: to go home with therapy CMS Medicare.gov Compare Post Acute Care list provided to:: Patient Choice offered to / list presented to : Patient  Expected Discharge Plan and Services Expected Discharge Plan: [place]   Discharge Planning Services: CM Consult Post Acute Care Choice: [place] arrangements for the past 2 months: Single Family Home Expected Discharge Date: 03/17/20               DME Arranged: [place]ford Rile rolling, 3-N-1 DME Agency: [employer] Date DME Agency Contacted: 03/17/20 Time DME Agency Contacted: [phone] Representative spoke with at DME Agency: [place]: PT [place]: [facility] [employer]     Representative spoke with at [place]: pre-arranged in MD office  Prior Living Arrangements/Services Living arrangements for the past 2 months: [place]   Patient language and need for interpreter reviewed:: Yes Do you feel safe going back to the place where you live?: Yes      Need for Family Participation in Patient Care: Yes (Comment) Care giver support system in place?: Yes (comment)   Criminal Activity/Legal Involvement Pertinent to Current Situation/Hospitalization: No - Comment as needed  Activities of Daily Living Home Assistive Devices/Equipment: Eyeglasses ADL Screening (condition at time of  admission) Patient's cognitive ability adequate to safely complete daily activities?: Yes Is the patient deaf or have difficulty hearing?: No Does the patient have difficulty seeing, even when wearing glasses/contacts?: No Does the patient have difficulty concentrating, remembering, or making decisions?: No Patient able to express need for assistance with ADLs?: Yes Does the patient have difficulty dressing or bathing?: No Independently performs ADLs?: Yes (appropriate for developmental age) Does the patient have difficulty walking or climbing stairs?: Yes Weakness of Legs: Left Weakness of Arms/Hands: None  Permission Sought/Granted                  Emotional Assessment Appearance:: Appears stated age Attitude/Demeanor/Rapport: Engaged Affect (typically observed): Accepting Orientation: : Oriented to Self, Oriented to Place, Oriented to  Time, Oriented to Situation   Psych Involvement: No (comment)  Admission diagnosis:  Status post total left knee replacement [Z96.652] Patient Active Problem List   Diagnosis Date Noted  . Status post total left knee replacement 03/16/2020  . Unilateral primary osteoarthritis, left knee 08/31/2019  . Other spondylosis with myelopathy, cervical region 05/18/2019  . S/P cervical spinal fusion 05/18/2019  . Cervical stenosis of spine 05/09/2019  . Spinal stenosis of cervical region 05/04/2019   PCP:  Lucia Gaskins, MD Pharmacy:   [facility] [address] [place] [address] Phone: [phone] Fax: [phone]     Social Determinants of Health (SDOH) Interventions    Readmission Risk Interventions No flowsheet data found.

## 2020-03-17 NOTE — Discharge Instructions (Signed)
INSTRUCTIONS AFTER JOINT REPLACEMENT   o Remove items at home which could result in a fall. This includes throw rugs or furniture in walking pathways o ICE to the affected joint every three hours while awake for 30 minutes at a time, for at least the first 3-5 days, and then as needed for pain and swelling.  Continue to use ice for pain and swelling. You may notice swelling that will progress down to the foot and ankle.  This is normal after surgery.  Elevate your leg when you are not up walking on it.   o Continue to use the breathing machine you got in the hospital (incentive spirometer) which will help keep your temperature down.  It is common for your temperature to cycle up and down following surgery, especially at night when you are not up moving around and exerting yourself.  The breathing machine keeps your lungs expanded and your temperature down.   DIET:  As you were doing prior to hospitalization, we recommend a well-balanced diet.  DRESSING / WOUND CARE / SHOWERING  Keep the surgical dressing until follow up.  The dressing is water proof, so you can shower without any extra covering.  IF THE DRESSING FALLS OFF or the wound gets wet inside, change the dressing with sterile gauze.  Please use good hand washing techniques before changing the dressing.  Do not use any lotions or creams on the incision until instructed by your surgeon.    ACTIVITY  o Increase activity slowly as tolerated, but follow the weight bearing instructions below.   o No driving for 6 weeks or until further direction given by your physician.  You cannot drive while taking narcotics.  o No lifting or carrying greater than 10 lbs. until further directed by your surgeon. o Avoid periods of inactivity such as sitting longer than an hour when not asleep. This helps prevent blood clots.  o You may return to work once you are authorized by your doctor.     WEIGHT BEARING   Weight bearing as tolerated with assist  device (walker, cane, etc) as directed, use it as long as suggested by your surgeon or therapist, typically at least 4-6 weeks.   EXERCISES  Results after joint replacement surgery are often greatly improved when you follow the exercise, range of motion and muscle strengthening exercises prescribed by your doctor. Safety measures are also important to protect the joint from further injury. Any time any of these exercises cause you to have increased pain or swelling, decrease what you are doing until you are comfortable again and then slowly increase them. If you have problems or questions, call your caregiver or physical therapist for advice.   Rehabilitation is important following a joint replacement. After just a few days of immobilization, the muscles of the leg can become weakened and shrink (atrophy).  These exercises are designed to build up the tone and strength of the thigh and leg muscles and to improve motion. Often times heat used for twenty to thirty minutes before working out will loosen up your tissues and help with improving the range of motion but do not use heat for the first two weeks following surgery (sometimes heat can increase post-operative swelling).   These exercises can be done on a training (exercise) mat, on the floor, on a table or on a bed. Use whatever works the best and is most comfortable for you.    Use music or television while you are exercising so that   the exercises are a pleasant break in your day. This will make your life better with the exercises acting as a break in your routine that you can look forward to.   Perform all exercises about fifteen times, three times per day or as directed.  You should exercise both the operative leg and the other leg as well.  Exercises include:   . Quad Sets - Tighten up the muscle on the front of the thigh (Quad) and hold for 5-10 seconds.   . Straight Leg Raises - With your knee straight (if you were given a brace, keep it on),  lift the leg to 60 degrees, hold for 3 seconds, and slowly lower the leg.  Perform this exercise against resistance later as your leg gets stronger.  . Leg Slides: Lying on your back, slowly slide your foot toward your buttocks, bending your knee up off the floor (only go as far as is comfortable). Then slowly slide your foot back down until your leg is flat on the floor again.  . Angel Wings: Lying on your back spread your legs to the side as far apart as you can without causing discomfort.  . Hamstring Strength:  Lying on your back, push your heel against the floor with your leg straight by tightening up the muscles of your buttocks.  Repeat, but this time bend your knee to a comfortable angle, and push your heel against the floor.  You may put a pillow under the heel to make it more comfortable if necessary.   A rehabilitation program following joint replacement surgery can speed recovery and prevent re-injury in the future due to weakened muscles. Contact your doctor or a physical therapist for more information on knee rehabilitation.    CONSTIPATION  Constipation is defined medically as fewer than three stools per week and severe constipation as less than one stool per week.  Even if you have a regular bowel pattern at home, your normal regimen is likely to be disrupted due to multiple reasons following surgery.  Combination of anesthesia, postoperative narcotics, change in appetite and fluid intake all can affect your bowels.   YOU MUST use at least one of the following options; they are listed in order of increasing strength to get the job done.  They are all available over the counter, and you may need to use some, POSSIBLY even all of these options:    Drink plenty of fluids (prune juice may be helpful) and high fiber foods Colace 100 mg by mouth twice a day  Senokot for constipation as directed and as needed Dulcolax (bisacodyl), take with full glass of water  Miralax (polyethylene glycol)  once or twice a day as needed.  If you have tried all these things and are unable to have a bowel movement in the first 3-4 days after surgery call either your surgeon or your primary doctor.    If you experience loose stools or diarrhea, hold the medications until you stool forms back up.  If your symptoms do not get better within 1 week or if they get worse, check with your doctor.  If you experience "the worst abdominal pain ever" or develop nausea or vomiting, please contact the office immediately for further recommendations for treatment.   ITCHING:  If you experience itching with your medications, try taking only a single pain pill, or even half a pain pill at a time.  You can also use Benadryl over the counter for itching or also to   help with sleep.   TED HOSE STOCKINGS:  Use stockings on both legs until for at least 2 weeks or as directed by physician office. They may be removed at night for sleeping.  MEDICATIONS:  See your medication summary on the "After Visit Summary" that nursing will review with you.  You may have some home medications which will be placed on hold until you complete the course of blood thinner medication.  It is important for you to complete the blood thinner medication as prescribed.  PRECAUTIONS:  If you experience chest pain or shortness of breath - call 911 immediately for transfer to the hospital emergency department.   If you develop a fever greater that 101 F, purulent drainage from wound, increased redness or drainage from wound, foul odor from the wound/dressing, or calf pain - CONTACT YOUR SURGEON.                                                   FOLLOW-UP APPOINTMENTS:  If you do not already have a post-op appointment, please call the office for an appointment to be seen by your surgeon.  Guidelines for how soon to be seen are listed in your "After Visit Summary", but are typically between 1-4 weeks after surgery.  OTHER INSTRUCTIONS:   Knee  Replacement:  Do not place pillow under knee, focus on keeping the knee straight while resting. CPM instructions: 0-90 degrees, 2 hours in the morning, 2 hours in the afternoon, and 2 hours in the evening. Place foam block, curve side up under heel at all times except when in CPM or when walking.  DO NOT modify, tear, cut, or change the foam block in any way.   DENTAL ANTIBIOTICS:  In most cases prophylactic antibiotics for Dental procdeures after total joint surgery are not necessary.  Exceptions are as follows:  1. History of prior total joint infection  2. Severely immunocompromised (Organ Transplant, cancer chemotherapy, Rheumatoid biologic meds such as [place])  3. Poorly controlled diabetes (A1C &gt; 8.0, blood glucose over 200)  If you have one of these conditions, contact your surgeon for an antibiotic prescription, prior to your dental proced  MAKE SURE YOU:  . Understand these instructions.  . Get help right away if you are not doing well or get worse.    Thank you for letting us be a part of your medical care team.  It is a privilege we respect greatly.  We hope these instructions will help you stay on track for a fast and full recovery!  Dental Antibiotics:  In most cases prophylactic antibiotics for Dental procdeures after total joint surgery are not necessary.  Exceptions are as follows:  1. History of prior total joint infection  2. Severely immunocompromised (Organ Transplant, cancer chemotherapy, Rheumatoid biologic meds such as [place])  3. Poorly controlled diabetes (A1C &gt; 8.0, blood glucose over 200)  If you have one of these conditions, contact your surgeon for an antibiotic prescription, prior to your dental procedure.

## 2020-03-17 NOTE — Plan of Care (Signed)
Patient discharged home in stable condition 

## 2020-03-18 DIAGNOSIS — E7800 Pure hypercholesterolemia, unspecified: Secondary | ICD-10-CM | POA: Diagnosis not present

## 2020-03-18 DIAGNOSIS — M4712 Other spondylosis with myelopathy, cervical region: Secondary | ICD-10-CM | POA: Diagnosis not present

## 2020-03-18 DIAGNOSIS — M4802 Spinal stenosis, cervical region: Secondary | ICD-10-CM | POA: Diagnosis not present

## 2020-03-18 DIAGNOSIS — I10 Essential (primary) hypertension: Secondary | ICD-10-CM | POA: Diagnosis not present

## 2020-03-18 DIAGNOSIS — Z96652 Presence of left artificial knee joint: Secondary | ICD-10-CM | POA: Diagnosis not present

## 2020-03-18 DIAGNOSIS — Z471 Aftercare following joint replacement surgery: Secondary | ICD-10-CM | POA: Diagnosis not present

## 2020-03-19 ENCOUNTER — Encounter (HOSPITAL_COMMUNITY): Payer: Self-pay | Admitting: Orthopaedic Surgery

## 2020-03-21 ENCOUNTER — Other Ambulatory Visit: Payer: Self-pay | Admitting: Orthopaedic Surgery

## 2020-03-21 DIAGNOSIS — M4712 Other spondylosis with myelopathy, cervical region: Secondary | ICD-10-CM | POA: Diagnosis not present

## 2020-03-21 DIAGNOSIS — M4802 Spinal stenosis, cervical region: Secondary | ICD-10-CM | POA: Diagnosis not present

## 2020-03-21 DIAGNOSIS — I10 Essential (primary) hypertension: Secondary | ICD-10-CM | POA: Diagnosis not present

## 2020-03-21 DIAGNOSIS — Z471 Aftercare following joint replacement surgery: Secondary | ICD-10-CM | POA: Diagnosis not present

## 2020-03-21 DIAGNOSIS — E7800 Pure hypercholesterolemia, unspecified: Secondary | ICD-10-CM | POA: Diagnosis not present

## 2020-03-21 DIAGNOSIS — Z96652 Presence of left artificial knee joint: Secondary | ICD-10-CM | POA: Diagnosis not present

## 2020-03-21 NOTE — Telephone Encounter (Signed)
Can you advise for Victor Conner? TKA 03/16/20

## 2020-03-22 DIAGNOSIS — M4802 Spinal stenosis, cervical region: Secondary | ICD-10-CM | POA: Diagnosis not present

## 2020-03-22 DIAGNOSIS — I10 Essential (primary) hypertension: Secondary | ICD-10-CM | POA: Diagnosis not present

## 2020-03-22 DIAGNOSIS — Z471 Aftercare following joint replacement surgery: Secondary | ICD-10-CM | POA: Diagnosis not present

## 2020-03-22 DIAGNOSIS — M4712 Other spondylosis with myelopathy, cervical region: Secondary | ICD-10-CM | POA: Diagnosis not present

## 2020-03-22 DIAGNOSIS — Z96652 Presence of left artificial knee joint: Secondary | ICD-10-CM | POA: Diagnosis not present

## 2020-03-22 DIAGNOSIS — E7800 Pure hypercholesterolemia, unspecified: Secondary | ICD-10-CM | POA: Diagnosis not present

## 2020-03-25 DIAGNOSIS — M4712 Other spondylosis with myelopathy, cervical region: Secondary | ICD-10-CM | POA: Diagnosis not present

## 2020-03-25 DIAGNOSIS — I10 Essential (primary) hypertension: Secondary | ICD-10-CM | POA: Diagnosis not present

## 2020-03-25 DIAGNOSIS — Z96652 Presence of left artificial knee joint: Secondary | ICD-10-CM | POA: Diagnosis not present

## 2020-03-25 DIAGNOSIS — E7800 Pure hypercholesterolemia, unspecified: Secondary | ICD-10-CM | POA: Diagnosis not present

## 2020-03-25 DIAGNOSIS — M4802 Spinal stenosis, cervical region: Secondary | ICD-10-CM | POA: Diagnosis not present

## 2020-03-25 DIAGNOSIS — Z471 Aftercare following joint replacement surgery: Secondary | ICD-10-CM | POA: Diagnosis not present

## 2020-03-26 ENCOUNTER — Other Ambulatory Visit: Payer: Self-pay | Admitting: Family

## 2020-03-27 NOTE — Telephone Encounter (Signed)
Please advise, Would you like to refill? Thank you.

## 2020-03-27 NOTE — Telephone Encounter (Signed)
Total knee

## 2020-03-28 DIAGNOSIS — M4802 Spinal stenosis, cervical region: Secondary | ICD-10-CM | POA: Diagnosis not present

## 2020-03-28 DIAGNOSIS — Z96652 Presence of left artificial knee joint: Secondary | ICD-10-CM | POA: Diagnosis not present

## 2020-03-28 DIAGNOSIS — M4712 Other spondylosis with myelopathy, cervical region: Secondary | ICD-10-CM | POA: Diagnosis not present

## 2020-03-28 DIAGNOSIS — E7800 Pure hypercholesterolemia, unspecified: Secondary | ICD-10-CM | POA: Diagnosis not present

## 2020-03-28 DIAGNOSIS — Z471 Aftercare following joint replacement surgery: Secondary | ICD-10-CM | POA: Diagnosis not present

## 2020-03-28 DIAGNOSIS — I10 Essential (primary) hypertension: Secondary | ICD-10-CM | POA: Diagnosis not present

## 2020-03-29 ENCOUNTER — Encounter: Payer: Self-pay | Admitting: Orthopaedic Surgery

## 2020-03-29 ENCOUNTER — Other Ambulatory Visit: Payer: Self-pay

## 2020-03-29 ENCOUNTER — Ambulatory Visit (INDEPENDENT_AMBULATORY_CARE_PROVIDER_SITE_OTHER): Payer: BLUE CROSS/BLUE SHIELD | Admitting: Orthopaedic Surgery

## 2020-03-29 DIAGNOSIS — Z96652 Presence of left artificial knee joint: Secondary | ICD-10-CM

## 2020-03-29 MED ORDER — METHOCARBAMOL 500 MG PO TABS: 500.0000 mg | ORAL_TABLET | Freq: Four times a day (QID) | ORAL | 1 refills | Status: DC | PRN

## 2020-03-29 MED ORDER — IBUPROFEN 800 MG PO TABS: 800.0000 mg | ORAL_TABLET | Freq: Three times a day (TID) | ORAL | 1 refills | Status: DC | PRN

## 2020-03-29 MED ORDER — OXYCODONE HCL 5 MG PO TABS: 5.0000 mg | ORAL_TABLET | ORAL | 0 refills | Status: DC | PRN

## 2020-03-29 NOTE — Progress Notes (Signed)
The patient is 2 weeks tomorrow status post a left total knee arthroplasty.  He says the knee is throbbing quite a bit at night.  Notes from therapy stated he has been able to flex his knee to about 90 degrees.  He has been on a baby aspirin twice daily as well.  Denies any foot and ankle swelling and he denies any calf pain.  On examination we did remove the staples in place Steri-Strips over his left knee incision.  He looks good overall.  There is moderate swelling of the knee to be expected.  His calf is soft and his foot is well-perfused and he has good flexion-extension of the foot and ankle.  I will refill his oxycodone and have him try her milligrams ibuprofen the other time have him stop the aspirin.  He will also continue his Robaxin.  We will need to set him up for outpatient physical therapy.  I have also advised him to try some Benadryl at night to see if that will help with him sleeping.  I will see him back in 4 weeks to see how he is doing overall.  All questions and concerns were answered and addressed.  No x-rays are needed in 4 weeks.

## 2020-03-30 ENCOUNTER — Other Ambulatory Visit: Payer: Self-pay

## 2020-03-30 DIAGNOSIS — M4802 Spinal stenosis, cervical region: Secondary | ICD-10-CM | POA: Diagnosis not present

## 2020-03-30 DIAGNOSIS — M4712 Other spondylosis with myelopathy, cervical region: Secondary | ICD-10-CM | POA: Diagnosis not present

## 2020-03-30 DIAGNOSIS — I10 Essential (primary) hypertension: Secondary | ICD-10-CM | POA: Diagnosis not present

## 2020-03-30 DIAGNOSIS — Z471 Aftercare following joint replacement surgery: Secondary | ICD-10-CM | POA: Diagnosis not present

## 2020-03-30 DIAGNOSIS — Z96652 Presence of left artificial knee joint: Secondary | ICD-10-CM | POA: Diagnosis not present

## 2020-03-30 DIAGNOSIS — E7800 Pure hypercholesterolemia, unspecified: Secondary | ICD-10-CM | POA: Diagnosis not present

## 2020-04-02 ENCOUNTER — Ambulatory Visit (HOSPITAL_COMMUNITY): Payer: BLUE CROSS/BLUE SHIELD | Attending: Orthopaedic Surgery

## 2020-04-02 ENCOUNTER — Other Ambulatory Visit: Payer: Self-pay

## 2020-04-02 ENCOUNTER — Encounter (HOSPITAL_COMMUNITY): Payer: Self-pay

## 2020-04-02 DIAGNOSIS — G8929 Other chronic pain: Secondary | ICD-10-CM | POA: Diagnosis not present

## 2020-04-02 DIAGNOSIS — M25662 Stiffness of left knee, not elsewhere classified: Secondary | ICD-10-CM

## 2020-04-02 DIAGNOSIS — M25562 Pain in left knee: Secondary | ICD-10-CM | POA: Diagnosis not present

## 2020-04-02 DIAGNOSIS — R262 Difficulty in walking, not elsewhere classified: Secondary | ICD-10-CM | POA: Diagnosis not present

## 2020-04-02 DIAGNOSIS — M6281 Muscle weakness (generalized): Secondary | ICD-10-CM | POA: Insufficient documentation

## 2020-04-02 NOTE — Therapy (Signed)
[place], [place], [address] Phone: [phone]   Fax:  [phone]  Physical Therapy Evaluation  Patient Details  Name: Victor Conner MRN: [id number] Date of Birth: 06/06/1966 Referring Provider (PT): Jean Rosenthal   Encounter Date: 04/02/2020   PT End of Session - 04/02/20 1056    Visit Number 1    Number of Visits 18    Date for PT Re-Evaluation 05/14/20    Authorization Type BCBS    Authorization Time Period 04/02/20 to 05/14/20    Authorization - Visit Number 1    Authorization - Number of Visits 20   20 visit limit   Progress Note Due on Visit 10    PT Start Time 0907    PT Stop Time 0950    PT Time Calculation (min) 43 min    Activity Tolerance Patient tolerated treatment well;No increased pain    Behavior During Therapy WFL for tasks assessed/performed           Past Medical History:  Diagnosis Date  . Arthritis   . Hypercholesteremia   . Hypertension     Past Surgical History:  Procedure Laterality Date  . ANTERIOR CERVICAL DECOMP/DISCECTOMY FUSION N/A 05/09/2019   Procedure: C4-5, C5-6 ANTERIOR CERVICAL DECOMPRESSION/DISCECTOMY FUSION, ALLOGRAFT, PLATE;  Surgeon: Marybelle Killings, MD;  Location: [facility];  Service: Orthopedics;  Laterality: N/A;  . BONE CYST EXCISION     left leg  . INCISION AND DRAINAGE     hand  . TOTAL KNEE ARTHROPLASTY Left 03/16/2020   Procedure: LEFT TOTAL KNEE ARTHROPLASTY;  Surgeon: Mcarthur Rossetti, MD;  Location: [facility];  Service: Orthopedics;  Laterality: Left;    There were no vitals filed for this visit.    Subjective Assessment - 04/02/20 0910    Subjective Pt reports L TKA 6/25, had acute care PT and HHPT. Pt reports most pain at night with knee stretching into extension. Pt reports severe "bone on bone" pain for 2 years, avoiding WB. Pt reports using RW/SPC since surgery, but normally is ind. 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      Limitations Standing    How long can you sit comfortably? no issues    How long can you stand comfortably? limited    How long can you walk comfortably? limited    Patient Stated Goals "walk out of this place"    Currently in Pain? No/denies              [facility] PT Assessment - 04/02/20 0001      Assessment   Medical Diagnosis L TKA    Referring Provider (PT) Jean Rosenthal    Onset Date/Surgical Date 03/16/20    Next MD Visit Mid August    Prior Therapy Acute PT, HHPT      Precautions   Precautions None      Restrictions   Weight Bearing Restrictions No      Balance Screen   Has the patient fallen in the past 6 months No    Has the patient had a decrease in activity level because of a fear of falling?  No    Is the patient reluctant to leave their home because of a fear of falling?  No      Prior Function   Level of Independence Independent    Vocation Full time employment    Vocation Requirements heating/air    Leisure golfing  Cognition   Overall Cognitive Status Within Functional Limits for tasks assessed      Observation/Other Assessments   Skin Integrity incision intact, no drainage, no erythema, slight bruising to medial thigh and posterior thigh    Focus on Therapeutic Outcomes (FOTO)  36% limited      Observation/Other Assessments-Edema    Edema Circumferential      Circumferential Edema   Circumferential - Right 16.75 in    Circumferential - Left  17.75 in      Sensation   Light Touch Appears Intact      Functional Tests   Functional tests Sit to Stand      Sit to Stand   Comments 5x STS: 19.5 sec, BUE assisting, LLE extended      ROM / Strength   AROM / PROM / Strength AROM;Strength      AROM   AROM Assessment Site Knee    Right/Left Knee Left    Left Knee Extension 9   from extension   Left Knee Flexion 90      Strength   Strength Assessment Site Hip;Knee;Ankle    Right Hip Flexion 5/5    Right Hip Extension 4+/5     Right Hip ABduction 5/5    Left Hip Flexion 5/5    Left Hip Extension 4+/5    Left Hip ABduction 5/5    Right Knee Flexion 4/5    Right Knee Extension 4+/5    Left Knee Flexion 5/5    Left Knee Extension 5/5    Right Ankle Dorsiflexion 5/5    Right Ankle Plantar Flexion 5/5    Left Ankle Dorsiflexion 5/5    Left Ankle Plantar Flexion 5/5      Palpation   Patella mobility slightly hypomobile    Palpation comment denies tenderness to palpation throughout knee joint, calf, hamstring, quad, joint line on L      Special Tests   Other special tests L SLR: maintains ~9 deg extension lag      Ambulation/Gait   Ambulation/Gait Yes    Ambulation/Gait Assistance 6: Modified independent (Device/Increase time)    Ambulation Distance (Feet) 226 Feet    Assistive device Straight cane    Gait Pattern Step-to pattern;Decreased stance time - left;Decreased stride length;Decreased hip/knee flexion - left;Decreased weight shift to left    Ambulation Surface Level;Indoor    Gait velocity 0.656 m/s    Gait Comments slight L toe external rotation, no loss of balance, decreased fluid gait cycle, mild dependence on SPC with turns      Balance   Balance Assessed --   L unable without UE assist              Objective measurements completed on examination: See above findings.         PT Education - 04/02/20 0915    Education Details Reviewed signs of infection and signs for DVT. Reviewed HH HEP and initiated OP HEP. Discussed importance of frequent ambulation, respecting pain, RICE as activity increases.    Person(s) Educated Patient    Methods Explanation;Demonstration;Handout    Comprehension Verbalized understanding;Returned demonstration            PT Short Term Goals - 04/02/20 1145      PT SHORT TERM GOAL #1   Title Pt will demonstrate L knee AROM 0-95deg to improve gait mechanics and ambulation tolerance.    Time 3    Period Weeks    Status New    Target  Date 04/23/20       PT SHORT TERM GOAL #2   Title Pt will ambulate at 0.48m/s without AD to return to golfing and work related activities.    Time 3    Period Weeks    Status New             PT Long Term Goals - 04/02/20 1145      PT LONG TERM GOAL #1   Title Pt will demonstrate L knee AROM 0-120deg to improve gait mechanics and ambulation tolerance.    Time 6    Period Weeks    Status New    Target Date 05/14/20      PT LONG TERM GOAL #2   Title Pt will ambulate at 1.75m/s without AD to return to golfing and work related activities.    Time 6    Period Weeks    Status New      PT LONG TERM GOAL #3   Title Pt will perform 5x STS in 15 sec or less, no UE assisting, BLE equal to demo improved functional strength and ability to rise from seated surfaces.    Time 6    Period Weeks    Status New      PT LONG TERM GOAL #4   Title Pt will report no pain with extracurricular activities and sleeping to improve overall QoL.    Time 6    Period Weeks    Status New      PT LONG TERM GOAL #5   Title Pt will improve FOTO to <25% limited to return to PLOF.    Time 6    Period Weeks    Status New                  Plan - 04/02/20 1102    Clinical Impression Statement Pt is a pleasant 54YO male s/p L TKA on 6/25. Pt currently with decreased L knee AROM, muscle weakness, and edema. Pt previously ind with ambulation and working full time, currently dependent on RW or SPC for ambulation. Pt L knee -9deg to 90 deg, limited by pain at endrange. Reviewed extensive HHPT HEP and narrowed down exercises; pt very determined with good understanding of limitations. Pt would benefit from skilled PT to improve deficits noted and progress to PLOF.    Personal Factors and Comorbidities Time since onset of injury/illness/exacerbation    Examination-Activity Limitations Lift;Sleep;Squat    Examination-Participation Restrictions Yard Work    Stability/Clinical Decision Making Stable/Uncomplicated    Clinical  Decision Making Low    Rehab Potential Good    PT Frequency 3x / week   decrease as appropriate; 20 visit limit   PT Duration 6 weeks    PT Treatment/Interventions ADLs/Self Care Home Management;Aquatic Therapy;Biofeedback;Cryotherapy;Electrical Stimulation;Moist Heat;Ultrasound;DME Instruction;Gait training;Stair training;Functional mobility training;Therapeutic activities;Therapeutic exercise;Balance training;Neuromuscular re-education;Patient/family education;Orthotic Fit/Training;Manual techniques;Manual lymph drainage;Scar mobilization;Passive range of motion;Dry needling;Taping;Joint Manipulations    PT Next Visit Plan Begin quad activation, knee ROM, weight-shifting, gait training without AD. Use manual as needed to gain ROM and for pain relief as needed.    PT Home Exercise Plan Eval: quad set, seated HS stretch, standing weight-shifts, HHPT HEP    Consulted and Agree with Plan of Care Patient           Patient will benefit from skilled therapeutic intervention in order to improve the following deficits and impairments:  Abnormal gait, Decreased activity tolerance, Decreased range of motion, Decreased strength, Difficulty walking, Hypomobility, Increased  edema, Pain  Visit Diagnosis: Chronic pain of left knee  Difficulty in walking, not elsewhere classified  Muscle weakness (generalized)  Stiffness of left knee, not elsewhere classified     Problem List Patient Active Problem List   Diagnosis Date Noted  . Status post total left knee replacement 03/16/2020  . Unilateral primary osteoarthritis, left knee 08/31/2019  . Other spondylosis with myelopathy, cervical region 05/18/2019  . S/P cervical spinal fusion 05/18/2019  . Cervical stenosis of spine 05/09/2019  . Spinal stenosis of cervical region 05/04/2019      Talbot Grumbling PT, DPT 04/02/20, 11:49 AM [place], [place],  [address] Phone: [phone]   Fax:  [phone]  Name: Victor Conner MRN: [id number] Date of Birth: April 21, 1966

## 2020-04-04 ENCOUNTER — Other Ambulatory Visit: Payer: Self-pay

## 2020-04-04 ENCOUNTER — Ambulatory Visit (HOSPITAL_COMMUNITY): Payer: BLUE CROSS/BLUE SHIELD | Admitting: Physical Therapy

## 2020-04-04 DIAGNOSIS — R262 Difficulty in walking, not elsewhere classified: Secondary | ICD-10-CM

## 2020-04-04 DIAGNOSIS — G8929 Other chronic pain: Secondary | ICD-10-CM | POA: Diagnosis not present

## 2020-04-04 DIAGNOSIS — M25662 Stiffness of left knee, not elsewhere classified: Secondary | ICD-10-CM | POA: Diagnosis not present

## 2020-04-04 DIAGNOSIS — M6281 Muscle weakness (generalized): Secondary | ICD-10-CM

## 2020-04-04 DIAGNOSIS — M25562 Pain in left knee: Secondary | ICD-10-CM | POA: Diagnosis not present

## 2020-04-04 NOTE — Therapy (Signed)
[place], [place], [address] Phone: [phone]   Fax:  [phone]  Physical Therapy Treatment  Patient Details  Name: Victor Conner MRN: [id number] Date of Birth: 08-Oct-1965 Referring Provider (PT): Jean Rosenthal   Encounter Date: 04/04/2020   PT End of Session - 04/04/20 0847    Visit Number 2    Number of Visits 18    Date for PT Re-Evaluation 05/14/20    Authorization Type BCBS    Authorization Time Period 04/02/20 to 05/14/20    Authorization - Visit Number 2    Authorization - Number of Visits 20   20 visit limit   Progress Note Due on Visit 10    PT Start Time 0830    PT Stop Time 0910    PT Time Calculation (min) 40 min    Activity Tolerance Patient tolerated treatment well;No increased pain    Behavior During Therapy WFL for tasks assessed/performed           Past Medical History:  Diagnosis Date  . Arthritis   . Hypercholesteremia   . Hypertension     Past Surgical History:  Procedure Laterality Date  . ANTERIOR CERVICAL DECOMP/DISCECTOMY FUSION N/A 05/09/2019   Procedure: C4-5, C5-6 ANTERIOR CERVICAL DECOMPRESSION/DISCECTOMY FUSION, ALLOGRAFT, PLATE;  Surgeon: Marybelle Killings, MD;  Location: [facility];  Service: Orthopedics;  Laterality: N/A;  . BONE CYST EXCISION     left leg  . INCISION AND DRAINAGE     hand  . TOTAL KNEE ARTHROPLASTY Left 03/16/2020   Procedure: LEFT TOTAL KNEE ARTHROPLASTY;  Surgeon: Mcarthur Rossetti, MD;  Location: [facility];  Service: Orthopedics;  Laterality: Left;    There were no vitals filed for this visit.   Subjective Assessment - 04/04/20 0831    Subjective Pt states that his knee is stiff.    Limitations Standing    How long can you sit comfortably? no issues    How long can you stand comfortably? limited    How long can you walk comfortably? limited    Patient Stated Goals "walk out of this place"    Currently in Pain? No/denies               [facility] Adult PT Treatment/Exercise - 04/04/20 0001      Ambulation/Gait   Ambulation Distance (Feet) 226 Feet    Assistive device None      Exercises   Exercises Knee/Hip      Knee/Hip Exercises: Stretches   Passive Hamstring Stretch Both;3 reps;30 seconds    Knee: Self-Stretch to increase Flexion Left;5 reps;30 seconds      Knee/Hip Exercises: Standing   Heel Raises Both;10 reps    Knee Flexion 10 reps    Terminal Knee Extension Strengthening;Left;10 reps    Functional Squat 10 reps    Rocker Board 2 minutes      Knee/Hip Exercises: Supine   Quad Sets Left;10 reps    Heel Slides 10 reps    Knee Extension Limitations 5    Knee Flexion Limitations 95      Manual Therapy   Manual Therapy Edema management    Manual therapy comments completed seperate from all other aspects of treatmentt    Edema Management decongestive techniques for edema  control                     PT Short Term Goals - 04/04/20 9528  PT SHORT TERM GOAL #1   Title Pt will demonstrate L knee AROM 0-95deg to improve gait mechanics and ambulation tolerance.    Time 3    Period Weeks    Status On-going    Target Date 04/23/20      PT SHORT TERM GOAL #2   Title Pt will ambulate at 0.74m/s without AD to return to golfing and work related activities.    Time 3    Period Weeks    Status On-going             PT Long Term Goals - 04/04/20 0900      PT LONG TERM GOAL #1   Title Pt will demonstrate L knee AROM 0-120deg to improve gait mechanics and ambulation tolerance.    Time 6    Period Weeks    Status New      PT LONG TERM GOAL #2   Title Pt will ambulate at 1.4m/s without AD to return to golfing and work related activities.    Time 6    Period Weeks    Status On-going      PT LONG TERM GOAL #3   Title Pt will perform 5x STS in 15 sec or less, no UE assisting, BLE equal to demo improved functional strength and ability to rise from seated surfaces.    Time 6    Period  Weeks    Status On-going      PT LONG TERM GOAL #4   Title Pt will report no pain with extracurricular activities and sleeping to improve overall QoL.    Time 6    Period Weeks    Status On-going      PT LONG TERM GOAL #5   Title Pt will improve FOTO to <25% limited to return to PLOF.    Time 6    Period Weeks    Status On-going                 Plan - 04/04/20 0848    Clinical Impression Statement Evaluation and goals reviewed with pt.  Treatment progressed exercises to focus on improving ROM, therapist also completed manual to decrease edema and sx of stiffness.    Personal Factors and Comorbidities Time since onset of injury/illness/exacerbation    Examination-Activity Limitations Lift;Sleep;Squat    Examination-Participation Restrictions Yard Work    Stability/Clinical Decision Making Stable/Uncomplicated    Rehab Potential Good    PT Frequency 3x / week   decrease as appropriate; 20 visit limit   PT Duration 6 weeks    PT Treatment/Interventions ADLs/Self Care Home Management;Aquatic Therapy;Biofeedback;Cryotherapy;Electrical Stimulation;Moist Heat;Ultrasound;DME Instruction;Gait training;Stair training;Functional mobility training;Therapeutic activities;Therapeutic exercise;Balance training;Neuromuscular re-education;Patient/family education;Orthotic Fit/Training;Manual techniques;Manual lymph drainage;Scar mobilization;Passive range of motion;Dry needling;Taping;Joint Manipulations    PT Next Visit Plan Continue to focus on ROM and manual for edema control    PT Home Exercise Plan Eval: quad set, seated HS stretch, standing weight-shifts, HHPT HEP    Consulted and Agree with Plan of Care Patient           Patient will benefit from skilled therapeutic intervention in order to improve the following deficits and impairments:  Abnormal gait, Decreased activity tolerance, Decreased range of motion, Decreased strength, Difficulty walking, Hypomobility, Increased edema,  Pain  Visit Diagnosis: Chronic pain of left knee  Difficulty in walking, not elsewhere classified  Muscle weakness (generalized)  Stiffness of left knee, not elsewhere classified     Problem List Patient Active Problem List  Diagnosis Date Noted  . Status post total left knee replacement 03/16/2020  . Unilateral primary osteoarthritis, left knee 08/31/2019  . Other spondylosis with myelopathy, cervical region 05/18/2019  . S/P cervical spinal fusion 05/18/2019  . Cervical stenosis of spine 05/09/2019  . Spinal stenosis of cervical region 05/04/2019   Rayetta Humphrey, PT CLT [phone] 04/04/2020, 9:17 AM  [place], [place], [address] Phone: [phone]   Fax:  [phone]  Name: Victor Conner MRN: [id number] Date of Birth: 09-17-1966

## 2020-04-06 ENCOUNTER — Other Ambulatory Visit: Payer: Self-pay | Admitting: Orthopaedic Surgery

## 2020-04-06 ENCOUNTER — Encounter (HOSPITAL_COMMUNITY): Payer: Self-pay

## 2020-04-06 ENCOUNTER — Ambulatory Visit (HOSPITAL_COMMUNITY): Payer: BLUE CROSS/BLUE SHIELD

## 2020-04-06 ENCOUNTER — Other Ambulatory Visit: Payer: Self-pay

## 2020-04-06 DIAGNOSIS — M25562 Pain in left knee: Secondary | ICD-10-CM | POA: Diagnosis not present

## 2020-04-06 DIAGNOSIS — G8929 Other chronic pain: Secondary | ICD-10-CM | POA: Diagnosis not present

## 2020-04-06 DIAGNOSIS — M6281 Muscle weakness (generalized): Secondary | ICD-10-CM | POA: Diagnosis not present

## 2020-04-06 DIAGNOSIS — R262 Difficulty in walking, not elsewhere classified: Secondary | ICD-10-CM | POA: Diagnosis not present

## 2020-04-06 DIAGNOSIS — M25662 Stiffness of left knee, not elsewhere classified: Secondary | ICD-10-CM

## 2020-04-06 NOTE — Telephone Encounter (Signed)
Pls advise. Thanks.  

## 2020-04-06 NOTE — Therapy (Signed)
[place], [place], [address] Phone: [phone]   Fax:  [phone]  Physical Therapy Treatment  Patient Details  Name: Victor Conner MRN: [id number] Date of Birth: [birth year] Referring Provider (PT): Jean Rosenthal   Encounter Date: 04/06/2020   PT End of Session - 04/06/20 0950    Visit Number 3    Number of Visits 18    Date for PT Re-Evaluation 05/14/20    Authorization Type BCBS    Authorization Time Period 04/02/20 to 05/14/20    Authorization - Visit Number 3    Authorization - Number of Visits 20   20 visit limit   Progress Note Due on Visit 10    PT Start Time 0950    PT Stop Time 1030    PT Time Calculation (min) 40 min    Activity Tolerance Patient tolerated treatment well;No increased pain    Behavior During Therapy WFL for tasks assessed/performed           Past Medical History:  Diagnosis Date  . Arthritis   . Hypercholesteremia   . Hypertension     Past Surgical History:  Procedure Laterality Date  . ANTERIOR CERVICAL DECOMP/DISCECTOMY FUSION N/A 05/09/2019   Procedure: C4-5, C5-6 ANTERIOR CERVICAL DECOMPRESSION/DISCECTOMY FUSION, ALLOGRAFT, PLATE;  Surgeon: Marybelle Killings, MD;  Location: [facility];  Service: Orthopedics;  Laterality: N/A;  . BONE CYST EXCISION     left leg  . INCISION AND DRAINAGE     hand  . TOTAL KNEE ARTHROPLASTY Left 03/16/2020   Procedure: LEFT TOTAL KNEE ARTHROPLASTY;  Surgeon: Mcarthur Rossetti, MD;  Location: [facility];  Service: Orthopedics;  Laterality: Left;    There were no vitals filed for this visit.   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 Pt reports more "soreness/achiness" and not so much pain.    Limitations Standing    How long can you sit comfortably? no issues    How long can you stand  comfortably? limited    How long can you walk comfortably? limited    Patient Stated Goals "walk out of this place"    Currently in Pain? No/denies                [facility] Adult PT Treatment/Exercise - 04/06/20 0001      Knee/Hip Exercises: Stretches   Passive Hamstring Stretch Left;3 reps;30 seconds    Knee: Self-Stretch to increase Flexion Left;10 seconds    Knee: Self-Stretch Limitations 10 reps on 8" step      Knee/Hip Exercises: Aerobic   Stationary Bike seat 21, rocking motion, 4 minutes      Knee/Hip Exercises: Standing   Terminal Knee Extension Left;15 reps    Theraband Level (Terminal Knee Extension) Level 3 (Green)    Rocker Board 2 minutes      Knee/Hip Exercises: Supine   Quad Sets Left;10 reps    Knee Extension AROM    Knee Extension Limitations 8    Knee Flexion AROM    Knee Flexion Limitations 96      Manual Therapy   Manual Therapy Edema management;Soft tissue mobilization    Manual therapy comments completed seperate from all other aspects of treatmentt    Edema Management BLE elevated on wedge, retro massage to improve edema with intermittent ankle pumps to assist  Soft tissue mobilization STM to L hamstring and quad to improve mobility, reduce pain and soreness                  PT Education - 04/06/20 0955    Education Details Exercise technique, continue HEP. Educated pt on continuing to RICE and using compression stocking on LLE to reduce swelling with increased activity.    Person(s) Educated Patient    Methods Explanation;Demonstration    Comprehension Verbalized understanding;Returned demonstration            PT Short Term Goals - 04/04/20 0859      PT SHORT TERM GOAL #1   Title Pt will demonstrate L knee AROM 0-95deg to improve gait mechanics and ambulation tolerance.    Time 3    Period Weeks    Status On-going    Target Date 04/23/20      PT SHORT TERM GOAL #2   Title Pt will ambulate at 0.56m/s without AD to return to  golfing and work related activities.    Time 3    Period Weeks    Status On-going             PT Long Term Goals - 04/04/20 0900      PT LONG TERM GOAL #1   Title Pt will demonstrate L knee AROM 0-120deg to improve gait mechanics and ambulation tolerance.    Time 6    Period Weeks    Status New      PT LONG TERM GOAL #2   Title Pt will ambulate at 1.64m/s without AD to return to golfing and work related activities.    Time 6    Period Weeks    Status On-going      PT LONG TERM GOAL #3   Title Pt will perform 5x STS in 15 sec or less, no UE assisting, BLE equal to demo improved functional strength and ability to rise from seated surfaces.    Time 6    Period Weeks    Status On-going      PT LONG TERM GOAL #4   Title Pt will report no pain with extracurricular activities and sleeping to improve overall QoL.    Time 6    Period Weeks    Status On-going      PT LONG TERM GOAL #5   Title Pt will improve FOTO to <25% limited to return to PLOF.    Time 6    Period Weeks    Status On-going                 Plan - 04/06/20 0956    Clinical Impression Statement Pt tolerates bike this session, seat set back to 21 and unable to complete revolution, but able to perform rocking to encourage flexion and extension. Continued with strengthening and ROM exercises without significant increased pain. Cued pt to improve heel strike in gait cycle to improve knee movement throughout gait cycle with good improvement. Ended with STM and edema massage to improve swelling and pain; pt denies increase in pain at EOS. Pt receptive to RICE and compression garment to improve edema and improve pain. Continued to progress as able.    Personal Factors and Comorbidities Time since onset of injury/illness/exacerbation    Examination-Activity Limitations Lift;Sleep;Squat    Examination-Participation Restrictions Yard Work    Stability/Clinical Decision Making Stable/Uncomplicated    Rehab Potential  Good    PT Frequency 3x / week   decrease as  appropriate; 20 visit limit   PT Duration 6 weeks    PT Treatment/Interventions ADLs/Self Care Home Management;Aquatic Therapy;Biofeedback;Cryotherapy;Electrical Stimulation;Moist Heat;Ultrasound;DME Instruction;Gait training;Stair training;Functional mobility training;Therapeutic activities;Therapeutic exercise;Balance training;Neuromuscular re-education;Patient/family education;Orthotic Fit/Training;Manual techniques;Manual lymph drainage;Scar mobilization;Passive range of motion;Dry needling;Taping;Joint Manipulations    PT Next Visit Plan Continue to focus on ROM and manual for edema control; strengthening to improve AROM.    PT Home Exercise Plan Eval: quad set, seated HS stretch, standing weight-shifts, HHPT HEP    Consulted and Agree with Plan of Care Patient           Patient will benefit from skilled therapeutic intervention in order to improve the following deficits and impairments:  Abnormal gait, Decreased activity tolerance, Decreased range of motion, Decreased strength, Difficulty walking, Hypomobility, Increased edema, Pain  Visit Diagnosis: Chronic pain of left knee  Stiffness of left knee, not elsewhere classified  Difficulty in walking, not elsewhere classified  Muscle weakness (generalized)     Problem List Patient Active Problem List   Diagnosis Date Noted  . Status post total left knee replacement 03/16/2020  . Unilateral primary osteoarthritis, left knee 08/31/2019  . Other spondylosis with myelopathy, cervical region 05/18/2019  . S/P cervical spinal fusion 05/18/2019  . Cervical stenosis of spine 05/09/2019  . Spinal stenosis of cervical region 05/04/2019     Talbot Grumbling PT, DPT 04/06/20, 11:49 AM [place], [place], [address] Phone: [phone]   Fax:  [phone]  Name: MACINTYRE ALEXA MRN: [id number] Date of Birth:  01-Dec-1965

## 2020-04-09 ENCOUNTER — Ambulatory Visit (HOSPITAL_COMMUNITY): Payer: BLUE CROSS/BLUE SHIELD

## 2020-04-09 ENCOUNTER — Encounter (HOSPITAL_COMMUNITY): Payer: Self-pay

## 2020-04-09 ENCOUNTER — Other Ambulatory Visit: Payer: Self-pay

## 2020-04-09 DIAGNOSIS — M25562 Pain in left knee: Secondary | ICD-10-CM

## 2020-04-09 DIAGNOSIS — M25662 Stiffness of left knee, not elsewhere classified: Secondary | ICD-10-CM

## 2020-04-09 DIAGNOSIS — R262 Difficulty in walking, not elsewhere classified: Secondary | ICD-10-CM | POA: Diagnosis not present

## 2020-04-09 DIAGNOSIS — M6281 Muscle weakness (generalized): Secondary | ICD-10-CM | POA: Diagnosis not present

## 2020-04-09 DIAGNOSIS — G8929 Other chronic pain: Secondary | ICD-10-CM | POA: Diagnosis not present

## 2020-04-09 NOTE — Therapy (Addendum)
[place], [place], [address] Phone: [phone]   Fax:  [phone]  Physical Therapy Treatment  Patient Details  Name: Victor Conner MRN: [id number] Date of Birth: 07-Feb-1966 Referring Provider (PT): Jean Rosenthal   Encounter Date: 04/09/2020   PT End of Session - 04/09/20 1247    Visit Number 4    Number of Visits 18    Date for PT Re-Evaluation 05/14/20    Authorization Type BCBS    Authorization Time Period 04/02/20 to 05/14/20    Authorization - Visit Number 4    Authorization - Number of Visits 20   20 visit limit   Progress Note Due on Visit 10    PT Start Time 1005    PT Stop Time 1045    PT Time Calculation (min) 40 min    Activity Tolerance Patient tolerated treatment well;No increased pain    Behavior During Therapy WFL for tasks assessed/performed           Past Medical History:  Diagnosis Date  . Arthritis   . Hypercholesteremia   . Hypertension     Past Surgical History:  Procedure Laterality Date  . ANTERIOR CERVICAL DECOMP/DISCECTOMY FUSION N/A 05/09/2019   Procedure: C4-5, C5-6 ANTERIOR CERVICAL DECOMPRESSION/DISCECTOMY FUSION, ALLOGRAFT, PLATE;  Surgeon: Marybelle Killings, MD;  Location: [facility];  Service: Orthopedics;  Laterality: N/A;  . BONE CYST EXCISION     left leg  . INCISION AND DRAINAGE     hand  . TOTAL KNEE ARTHROPLASTY Left 03/16/2020   Procedure: LEFT TOTAL KNEE ARTHROPLASTY;  Surgeon: Mcarthur Rossetti, MD;  Location: [facility];  Service: Orthopedics;  Laterality: Left;    There were no vitals filed for this visit.   Subjective Assessment - 04/09/20 1019    Subjective Not really in pain; soreness in muscles. Feels he improves and then steps back a little bit.    Limitations Standing    How long can you sit comfortably? no issues    How long can you stand comfortably? limited    How long can you walk comfortably? limited    Patient Stated Goals "walk out of  this place"    Currently in Pain? No/denies                             [facility] Adult PT Treatment/Exercise - 04/09/20 0001      Knee/Hip Exercises: Stretches   Passive Hamstring Stretch Left;3 reps;30 seconds    Knee: Self-Stretch to increase Flexion Left;10 seconds    Knee: Self-Stretch Limitations 10 reps on 12" step      Knee/Hip Exercises: Aerobic   Stationary Bike seat 21, rocking motion, 4 minutes   was able to go around backwards with some hip hike initital     Knee/Hip Exercises: Standing   Heel Raises Both;10 reps    Knee Flexion 10 reps    Terminal Knee Extension Left;15 reps    Theraband Level (Terminal Knee Extension) Level 3 (Green)    Functional Squat 10 reps    Rocker Board 2 minutes      Knee/Hip Exercises: Supine   Quad Sets Left;10 reps    Quad Sets Limitations 5 sec holds    Heel Slides 10 reps    Heel Slides Limitations 5 sec holds    Knee Extension AROM;Left    Knee Extension Limitations 6    Knee Flexion AROM;Left  Knee Flexion Limitations 102      Manual Therapy   Manual Therapy Edema management;Soft tissue mobilization;Joint mobilization    Manual therapy comments completed seperate from all other aspects of treatmentt    Edema Management BLE elevated on wedge, retro massage to improve edema with intermittent ankle pumps to assist    Joint Mobilization patellar mobs    Soft tissue mobilization STM to L hamstring and quad to improve mobility, reduce pain and soreness; scar mobilization    Scapular Mobilization --                  PT Education - 04/09/20 1021    Education Details Exercise technique, continue HEP. Educated pt on continuing to RICE and using compression stocking on LLE to reduce swelling with increased activity.    Person(s) Educated Patient    Methods Explanation    Comprehension Verbalized understanding            PT Short Term Goals - 04/09/20 1251      PT SHORT TERM GOAL #1   Title Pt will  demonstrate L knee AROM 0-95deg to improve gait mechanics and ambulation tolerance.    Baseline 04/09/20 - 6 to 102    Time 3    Period Weeks    Status Partially Met    Target Date 04/23/20      PT SHORT TERM GOAL #2   Title Pt will ambulate at 0.91ms without AD to return to golfing and work related activities.    Time 3    Period Weeks    Status On-going             PT Long Term Goals - 04/09/20 1252      PT LONG TERM GOAL #1   Title Pt will demonstrate L knee AROM 0-120deg to improve gait mechanics and ambulation tolerance.    Time 6    Period Weeks    Status On-going      PT LONG TERM GOAL #2   Title Pt will ambulate at 1.018m without AD to return to golfing and work related activities.    Time 6    Period Weeks    Status On-going      PT LONG TERM GOAL #3   Title Pt will perform 5x STS in 15 sec or less, no UE assisting, BLE equal to demo improved functional strength and ability to rise from seated surfaces.    Time 6    Period Weeks    Status On-going      PT LONG TERM GOAL #4   Title Pt will report no pain with extracurricular activities and sleeping to improve overall QoL.    Time 6    Period Weeks    Status On-going      PT LONG TERM GOAL #5   Title Pt will improve FOTO to <25% limited to return to PLOF.    Time 6    Period Weeks    Status On-going                 Plan - 04/09/20 1248    Clinical Impression Statement Pt tolerated bike this session, seat set back to 21 and able to complete revolution retro with hip hike toward end of 4 minutes. Continued with strengthening and ROM exercises without significant increased pain. Increased self stretch flexion to 12 inches from 8 inches. Increased to 5 sec hold on quad sets and heel slides in supine. Ended with STM  and edema massage to improve swelling and pain; added patellar joint glides in all directions and gentle scar mobility to surgical incision. Pt denies increase in pain at EOS, although fatigue  muscles. Pt will continue to RICE and compression garment to improve edema and improve pain. Continued to progress as able.    Personal Factors and Comorbidities Time since onset of injury/illness/exacerbation    Examination-Activity Limitations Lift;Sleep;Squat    Examination-Participation Restrictions Yard Work    Stability/Clinical Decision Making Stable/Uncomplicated    Rehab Potential Good    PT Frequency 3x / week   decrease as appropriate; 20 visit limit   PT Duration 6 weeks    PT Treatment/Interventions ADLs/Self Care Home Management;Aquatic Therapy;Biofeedback;Cryotherapy;Electrical Stimulation;Moist Heat;Ultrasound;DME Instruction;Gait training;Stair training;Functional mobility training;Therapeutic activities;Therapeutic exercise;Balance training;Neuromuscular re-education;Patient/family education;Orthotic Fit/Training;Manual techniques;Manual lymph drainage;Scar mobilization;Passive range of motion;Dry needling;Taping;Joint Manipulations    PT Next Visit Plan Continue to focus on ROM and manual for edema control; strengthening to improve AROM.    PT Home Exercise Plan Eval: quad set, seated HS stretch, standing weight-shifts, HHPT HEP    Consulted and Agree with Plan of Care Patient           Patient will benefit from skilled therapeutic intervention in order to improve the following deficits and impairments:  Abnormal gait, Decreased activity tolerance, Decreased range of motion, Decreased strength, Difficulty walking, Hypomobility, Increased edema, Pain  Visit Diagnosis: Chronic pain of left knee  Stiffness of left knee, not elsewhere classified  Difficulty in walking, not elsewhere classified  Muscle weakness (generalized)     Problem List Patient Active Problem List   Diagnosis Date Noted  . Status post total left knee replacement 03/16/2020  . Unilateral primary osteoarthritis, left knee 08/31/2019  . Other spondylosis with myelopathy, cervical region 05/18/2019    . S/P cervical spinal fusion 05/18/2019  . Cervical stenosis of spine 05/09/2019  . Spinal stenosis of cervical region 05/04/2019    Floria Raveling. Hartnett-Rands, MS, PT Per [facility] #[id number] 04/09/2020, 12:55 PM  [facility] [address] [place], [place], [address] Phone: [phone]   Fax:  [phone]  Name: GILBERT MANOLIS MRN: [id number] Date of Birth: 07/08/1966

## 2020-04-11 ENCOUNTER — Other Ambulatory Visit: Payer: Self-pay

## 2020-04-11 ENCOUNTER — Ambulatory Visit (HOSPITAL_COMMUNITY): Payer: BLUE CROSS/BLUE SHIELD

## 2020-04-11 DIAGNOSIS — M25662 Stiffness of left knee, not elsewhere classified: Secondary | ICD-10-CM | POA: Diagnosis not present

## 2020-04-11 DIAGNOSIS — G8929 Other chronic pain: Secondary | ICD-10-CM | POA: Diagnosis not present

## 2020-04-11 DIAGNOSIS — M25562 Pain in left knee: Secondary | ICD-10-CM | POA: Diagnosis not present

## 2020-04-11 DIAGNOSIS — M6281 Muscle weakness (generalized): Secondary | ICD-10-CM | POA: Diagnosis not present

## 2020-04-11 DIAGNOSIS — R262 Difficulty in walking, not elsewhere classified: Secondary | ICD-10-CM | POA: Diagnosis not present

## 2020-04-11 NOTE — Patient Instructions (Signed)
Short Arc [employer] a large can or rolled towel under left leg. Towel roll between knees. Straighten leg. Hold 5____ seconds. Repeat ____ times. Do ____ sessions per day.  http://gt2.exer.us/298   Copyright  VHI. All rights reserved.   Straight Leg Raise    Lie on back with weight through your elbows. Tighten stomach and slowly raise locked right leg 9____ inches from floor. Repeat 15____ times per set. 5 second hold. Do _1___ sets per session. Do _2___ sessions per day.  http://orth.exer.us/1102   Copyright  VHI. All rights reserved.

## 2020-04-11 NOTE — Therapy (Signed)
[place], [place], [address] Phone: [phone]   Fax:  [phone]  Physical Therapy Treatment  Patient Details  Name: Victor Conner MRN: [id number] Date of Birth: 05/18/1966 Referring Provider (PT): Jean Rosenthal   Encounter Date: 04/11/2020   PT End of Session - 04/11/20 1004    Visit Number 5    Number of Visits 18    Date for PT Re-Evaluation 05/14/20    Authorization Type BCBS    Authorization Time Period 04/02/20 to 05/14/20    Authorization - Visit Number 5    Authorization - Number of Visits 20   20 visit limit   Progress Note Due on Visit 10    PT Start Time 0918    PT Stop Time 1001    PT Time Calculation (min) 43 min    Activity Tolerance Patient tolerated treatment well;No increased pain    Behavior During Therapy WFL for tasks assessed/performed           Past Medical History:  Diagnosis Date  . Arthritis   . Hypercholesteremia   . Hypertension     Past Surgical History:  Procedure Laterality Date  . ANTERIOR CERVICAL DECOMP/DISCECTOMY FUSION N/A 05/09/2019   Procedure: C4-5, C5-6 ANTERIOR CERVICAL DECOMPRESSION/DISCECTOMY FUSION, ALLOGRAFT, PLATE;  Surgeon: Marybelle Killings, MD;  Location: [facility];  Service: Orthopedics;  Laterality: N/A;  . BONE CYST EXCISION     left leg  . INCISION AND DRAINAGE     hand  . TOTAL KNEE ARTHROPLASTY Left 03/16/2020   Procedure: LEFT TOTAL KNEE ARTHROPLASTY;  Surgeon: Mcarthur Rossetti, MD;  Location: [facility];  Service: Orthopedics;  Laterality: Left;    There were no vitals filed for this visit.   Subjective Assessment - 04/11/20 0923    Subjective Maybe 1/10 pain; more sore; maybe did too much yesterday walking; didn't sleep well last night due to dog out all night.    Limitations Standing    How long can you sit comfortably? no issues    How long can you stand comfortably? limited    How long can you walk comfortably? limited    Patient  Stated Goals "walk out of this place"    Currently in Pain? Yes    Pain Score 1     Pain Location Knee    Pain Orientation Left              [facility] Adult PT Treatment/Exercise - 04/11/20 0001      Knee/Hip Exercises: Stretches   Passive Hamstring Stretch Left;3 reps;30 seconds    Knee: Self-Stretch to increase Flexion Left;10 seconds    Knee: Self-Stretch Limitations 10 reps on 12" step    Gastroc Stretch Both;3 reps;30 seconds    Gastroc Stretch Limitations slantboard      Knee/Hip Exercises: Aerobic   Stationary Bike seat 21, rocking motion, 4 minutes   was able to go around backwards with some hip hike initital     Knee/Hip Exercises: Standing   Heel Raises Both;15 reps    Knee Flexion Strengthening;Left;1 set;15 reps    Terminal Knee Extension Left;15 reps   5 sec holds   Theraband Level (Terminal Knee Extension) Level 3 (Green)    Functional Squat 10 reps    Rocker Board 2 minutes    Rocker Board Limitations M/L      Knee/Hip Exercises: Supine   Quad Sets Left;15 reps    Quad Sets Limitations 5 sec  holds    Short Arc [employer] Strengthening;Left;1 set;10 reps    Short [employer] Limitations 5 sed hold; towel roll squeeze    Heel Slides 10 reps    Heel Slides Limitations 5 sec holds    Straight Leg Raises Strengthening;Left;2 sets;5 reps    Straight Leg Raises Limitations on elbows    Knee Extension AROM;Left    Knee Extension Limitations 4    Knee Flexion AROM;Left    Knee Flexion Limitations 105              PT Education - 04/11/20 0935    Education Details Exercise technique, continue HEP. Advanced HEP for VMO strengthening.    Person(s) Educated Patient    Methods Explanation;Demonstration;Handout    Comprehension Verbalized understanding;Returned demonstration            PT Short Term Goals - 04/11/20 1009      PT SHORT TERM GOAL #1   Title Pt will demonstrate L knee AROM 0-95deg to improve gait mechanics and ambulation tolerance.     Baseline 04/09/20 - 6 to 102    Time 3    Period Weeks    Status Partially Met    Target Date 04/23/20      PT SHORT TERM GOAL #2   Title Pt will ambulate at 0.71ms without AD to return to golfing and work related activities.    Time 3    Period Weeks    Status On-going             PT Long Term Goals - 04/11/20 1009      PT LONG TERM GOAL #1   Title Pt will demonstrate L knee AROM 0-120deg to improve gait mechanics and ambulation tolerance.    Time 6    Period Weeks    Status On-going      PT LONG TERM GOAL #2   Title Pt will ambulate at 1.066m without AD to return to golfing and work related activities.    Time 6    Period Weeks    Status On-going      PT LONG TERM GOAL #3   Title Pt will perform 5x STS in 15 sec or less, no UE assisting, BLE equal to demo improved functional strength and ability to rise from seated surfaces.    Time 6    Period Weeks    Status On-going      PT LONG TERM GOAL #4   Title Pt will report no pain with extracurricular activities and sleeping to improve overall QoL.    Time 6    Period Weeks    Status On-going      PT LONG TERM GOAL #5   Title Pt will improve FOTO to <25% limited to return to PLOF.    Time 6    Period Weeks    Status On-going             Plan - 04/11/20 1004    Clinical Impression Statement Pt tolerated bike this session, seat set back to 21 and able to complete revolution retro with hip hike toward end of 4 minutes. Continued with strengthening and ROM exercises without increased pain. Added SAQ with adduction squeeze and SLR with VMO activation emphasis; extensor lag noted with 5 sec hold and position supine on elbows. Added gastric stretch on slandboard, Added SLR on elbows and SAQ with adductor squeeze to HEP. No manual performed this session to focus on VMO strengthening and  calf stretching to improve knee extension. At end of session measured 4 lacking to 105 (last session 6 lacking to 102). Pt denies increase  in pain at EOS, although fatigued muscles. Continue with current plan. Progress as able.    Personal Factors and Comorbidities Time since onset of injury/illness/exacerbation    Examination-Activity Limitations Lift;Sleep;Squat    Examination-Participation Restrictions Yard Work    Stability/Clinical Decision Making Stable/Uncomplicated    Rehab Potential Good    PT Frequency 3x / week   decrease as appropriate; 20 visit limit   PT Duration 6 weeks    PT Treatment/Interventions ADLs/Self Care Home Management;Aquatic Therapy;Biofeedback;Cryotherapy;Electrical Stimulation;Moist Heat;Ultrasound;DME Instruction;Gait training;Stair training;Functional mobility training;Therapeutic activities;Therapeutic exercise;Balance training;Neuromuscular re-education;Patient/family education;Orthotic Fit/Training;Manual techniques;Manual lymph drainage;Scar mobilization;Passive range of motion;Dry needling;Taping;Joint Manipulations    PT Next Visit Plan Continue to focus on ROM, VMO strength/control, and manual for edema control; strengthening to improve AROM. Attempt STS possibly.    PT Home Exercise Plan Eval: quad set, seated HS stretch, standing weight-shifts, HHPT HEP; 04/11/20 - SAQ w/ add squeeze, SLR on elbows    Consulted and Agree with Plan of Care Patient           Patient will benefit from skilled therapeutic intervention in order to improve the following deficits and impairments:  Abnormal gait, Decreased activity tolerance, Decreased range of motion, Decreased strength, Difficulty walking, Hypomobility, Increased edema, Pain  Visit Diagnosis: Chronic pain of left knee  Stiffness of left knee, not elsewhere classified  Difficulty in walking, not elsewhere classified  Muscle weakness (generalized)     Problem List Patient Active Problem List   Diagnosis Date Noted  . Status post total left knee replacement 03/16/2020  . Unilateral primary osteoarthritis, left knee 08/31/2019  . Other  spondylosis with myelopathy, cervical region 05/18/2019  . S/P cervical spinal fusion 05/18/2019  . Cervical stenosis of spine 05/09/2019  . Spinal stenosis of cervical region 05/04/2019    Floria Raveling. Hartnett-Rands, MS, PT Per [facility] #[id number] 04/11/2020, 10:12 AM  [place], [place], [address] Phone: [phone]   Fax:  [phone]  Name: Victor Conner MRN: [id number] Date of Birth: May 28, 1966

## 2020-04-13 ENCOUNTER — Other Ambulatory Visit: Payer: Self-pay

## 2020-04-13 ENCOUNTER — Ambulatory Visit (HOSPITAL_COMMUNITY): Payer: BLUE CROSS/BLUE SHIELD

## 2020-04-13 ENCOUNTER — Encounter (HOSPITAL_COMMUNITY): Payer: Self-pay

## 2020-04-13 DIAGNOSIS — R262 Difficulty in walking, not elsewhere classified: Secondary | ICD-10-CM | POA: Diagnosis not present

## 2020-04-13 DIAGNOSIS — G8929 Other chronic pain: Secondary | ICD-10-CM

## 2020-04-13 DIAGNOSIS — M25562 Pain in left knee: Secondary | ICD-10-CM | POA: Diagnosis not present

## 2020-04-13 DIAGNOSIS — M6281 Muscle weakness (generalized): Secondary | ICD-10-CM

## 2020-04-13 DIAGNOSIS — M25662 Stiffness of left knee, not elsewhere classified: Secondary | ICD-10-CM

## 2020-04-13 NOTE — Therapy (Signed)
[place], [place], [address] Phone: [phone]   Fax:  [phone]  Physical Therapy Treatment  Patient Details  Name: Victor Conner MRN: [id number] Date of Birth: September 27, 1965 Referring Provider (PT): Jean Rosenthal   Encounter Date: 04/13/2020   PT End of Session - 04/13/20 0822    Visit Number 6    Number of Visits 18    Date for PT Re-Evaluation 05/14/20    Authorization Type BCBS    Authorization Time Period 04/02/20 to 05/14/20    Authorization - Visit Number 6    Authorization - Number of Visits 20   20 visit limit   Progress Note Due on Visit 10    PT Start Time 0817    PT Stop Time 0901    PT Time Calculation (min) 44 min    Activity Tolerance Patient tolerated treatment well    Behavior During Therapy [facility] for tasks assessed/performed           Past Medical History:  Diagnosis Date  . Arthritis   . Hypercholesteremia   . Hypertension     Past Surgical History:  Procedure Laterality Date  . ANTERIOR CERVICAL DECOMP/DISCECTOMY FUSION N/A 05/09/2019   Procedure: C4-5, C5-6 ANTERIOR CERVICAL DECOMPRESSION/DISCECTOMY FUSION, ALLOGRAFT, PLATE;  Surgeon: Marybelle Killings, MD;  Location: [facility];  Service: Orthopedics;  Laterality: N/A;  . BONE CYST EXCISION     left leg  . INCISION AND DRAINAGE     hand  . TOTAL KNEE ARTHROPLASTY Left 03/16/2020   Procedure: LEFT TOTAL KNEE ARTHROPLASTY;  Surgeon: Mcarthur Rossetti, MD;  Location: [facility];  Service: Orthopedics;  Laterality: Left;    There were no vitals filed for this visit.   Subjective Assessment - 04/13/20 0821    Subjective Pt reports sore and stiff, not so much painful. Pt reports doing more at home and therapy is helping.    Limitations Standing    How long can you sit comfortably? no issues    How long can you stand comfortably? limited    How long can you walk comfortably? limited    Patient Stated Goals "walk out of this  place"    Currently in Pain? Yes    Pain Score 2     Pain Location Knee    Pain Orientation Left    Pain Descriptors / Indicators Sore                     [facility] Adult PT Treatment/Exercise - 04/13/20 0001      Knee/Hip Exercises: Stretches   Passive Hamstring Stretch Left;5 reps;30 seconds    Knee: Self-Stretch to increase Flexion Left;10 seconds    Knee: Self-Stretch Limitations 10 reps on 12" step    Gastroc Stretch Both;3 reps;30 seconds    Gastroc Stretch Limitations slant board      Knee/Hip Exercises: Aerobic   Stationary Bike seat 21, rocking motion, 4 minutes      Knee/Hip Exercises: Standing   Other Standing Knee Exercises wall sits, 5x10 sec hold      Knee/Hip Exercises: Supine   Quad Sets Left;15 reps    Quad Sets Limitations 5 sec hold    Short Arc Quad Sets Left;15 reps;2 sets    [employer] Sets Limitations 5 sec hold; towel squeeze with second set    Heel Slides Left;15 reps    Heel Slides Limitations 5 sec hold, therapist providing overpressure  Knee Extension AROM;Left    Knee Extension Limitations 6    Knee Flexion AROM;Left    Knee Flexion Limitations 100                  PT Education - 04/13/20 0822    Education Details Continue HEP, exercise technique    Person(s) Educated Patient    Methods Explanation;Demonstration    Comprehension Verbalized understanding;Returned demonstration            PT Short Term Goals - 04/11/20 1009      PT SHORT TERM GOAL #1   Title Pt will demonstrate L knee AROM 0-95deg to improve gait mechanics and ambulation tolerance.    Baseline 04/09/20 - 6 to 102    Time 3    Period Weeks    Status Partially Met    Target Date 04/23/20      PT SHORT TERM GOAL #2   Title Pt will ambulate at 0.63ms without AD to return to golfing and work related activities.    Time 3    Period Weeks    Status On-going             PT Long Term Goals - 04/11/20 1009      PT LONG TERM GOAL #1   Title Pt  will demonstrate L knee AROM 0-120deg to improve gait mechanics and ambulation tolerance.    Time 6    Period Weeks    Status On-going      PT LONG TERM GOAL #2   Title Pt will ambulate at 1.022m without AD to return to golfing and work related activities.    Time 6    Period Weeks    Status On-going      PT LONG TERM GOAL #3   Title Pt will perform 5x STS in 15 sec or less, no UE assisting, BLE equal to demo improved functional strength and ability to rise from seated surfaces.    Time 6    Period Weeks    Status On-going      PT LONG TERM GOAL #4   Title Pt will report no pain with extracurricular activities and sleeping to improve overall QoL.    Time 6    Period Weeks    Status On-going      PT LONG TERM GOAL #5   Title Pt will improve FOTO to <25% limited to return to PLOF.    Time 6    Period Weeks    Status On-going                 Plan - 04/13/20 [id number]  Clinical Impression Statement Pt able to perform full revolutions backwards on bike this session with hip hike. Continued with stretching calf and hamstring and educated pt on performing first thing when waking up to improve stiffness/soreness at home with good understanding. Continued with exercises to improve strength and ROM with therapist providing overpressure as needed. Updated HEP with stretches for improving soreness. Continue to progress as able.    Personal Factors and Comorbidities Time since onset of injury/illness/exacerbation    Examination-Activity Limitations Lift;Sleep;Squat    Examination-Participation Restrictions Yard Work    Stability/Clinical Decision Making Stable/Uncomplicated    Rehab Potential Good    PT Frequency 3x / week   decrease as appropriate; 20 visit limit   PT Duration 6 weeks    PT Treatment/Interventions ADLs/Self Care Home Management;Aquatic Therapy;Biofeedback;Cryotherapy;Electrical Stimulation;Moist Heat;Ultrasound;DME Instruction;Gait training;Stair training;Functional  mobility training;Therapeutic  activities;Therapeutic exercise;Balance training;Neuromuscular re-education;Patient/family education;Orthotic Fit/Training;Manual techniques;Manual lymph drainage;Scar mobilization;Passive range of motion;Dry needling;Taping;Joint Manipulations    PT Next Visit Plan Continue to focus on ROM, VMO strength/control, and manual for edema control; strengthening to improve AROM.    PT Home Exercise Plan Eval: quad set, seated HS stretch, standing weight-shifts, HHPT HEP; 04/11/20 - SAQ w/ add squeeze, SLR on elbows; 7/22: calf stretch, HS stretch, knee flexion stretch    Consulted and Agree with Plan of Care Patient           Patient will benefit from skilled therapeutic intervention in order to improve the following deficits and impairments:  Abnormal gait, Decreased activity tolerance, Decreased range of motion, Decreased strength, Difficulty walking, Hypomobility, Increased edema, Pain  Visit Diagnosis: Chronic pain of left knee  Stiffness of left knee, not elsewhere classified  Difficulty in walking, not elsewhere classified  Muscle weakness (generalized)     Problem List Patient Active Problem List   Diagnosis Date Noted  . Status post total left knee replacement 03/16/2020  . Unilateral primary osteoarthritis, left knee 08/31/2019  . Other spondylosis with myelopathy, cervical region 05/18/2019  . S/P cervical spinal fusion 05/18/2019  . Cervical stenosis of spine 05/09/2019  . Spinal stenosis of cervical region 05/04/2019     Victor Conner PT, DPT 04/13/20, 9:09 AM [place], [place], [address] Phone: [phone]   Fax:  [phone]  Name: Victor Conner MRN: [id number] Date of Birth: 24-Apr-1966

## 2020-04-17 ENCOUNTER — Other Ambulatory Visit: Payer: Self-pay

## 2020-04-17 ENCOUNTER — Ambulatory Visit (HOSPITAL_COMMUNITY): Payer: BLUE CROSS/BLUE SHIELD | Admitting: Physical Therapy

## 2020-04-17 ENCOUNTER — Other Ambulatory Visit: Payer: Self-pay | Admitting: Orthopaedic Surgery

## 2020-04-17 ENCOUNTER — Encounter (HOSPITAL_COMMUNITY): Payer: Self-pay | Admitting: Physical Therapy

## 2020-04-17 DIAGNOSIS — M25662 Stiffness of left knee, not elsewhere classified: Secondary | ICD-10-CM

## 2020-04-17 DIAGNOSIS — G8929 Other chronic pain: Secondary | ICD-10-CM

## 2020-04-17 DIAGNOSIS — R262 Difficulty in walking, not elsewhere classified: Secondary | ICD-10-CM

## 2020-04-17 DIAGNOSIS — M6281 Muscle weakness (generalized): Secondary | ICD-10-CM | POA: Diagnosis not present

## 2020-04-17 DIAGNOSIS — M25562 Pain in left knee: Secondary | ICD-10-CM

## 2020-04-17 NOTE — Therapy (Signed)
[place], [place], [address] Phone: [phone]   Fax:  [phone]  Physical Therapy Treatment  Patient Details  Name: Victor Conner MRN: [id number] Date of Birth: November 05, 1965 Referring Provider (PT): Jean Rosenthal   Encounter Date: 04/17/2020   PT End of Session - 04/17/20 1023    Visit Number 7    Number of Visits 18    Date for PT Re-Evaluation 05/14/20    Authorization Type BCBS    Authorization Time Period 04/02/20 to 05/14/20    Authorization - Visit Number 7    Authorization - Number of Visits 20   20 visit limit   Progress Note Due on Visit 10    PT Start Time 1005    PT Stop Time 1045    PT Time Calculation (min) 40 min    Activity Tolerance Patient tolerated treatment well    Behavior During Therapy [facility] for tasks assessed/performed           Past Medical History:  Diagnosis Date  . Arthritis   . Hypercholesteremia   . Hypertension     Past Surgical History:  Procedure Laterality Date  . ANTERIOR CERVICAL DECOMP/DISCECTOMY FUSION N/A 05/09/2019   Procedure: C4-5, C5-6 ANTERIOR CERVICAL DECOMPRESSION/DISCECTOMY FUSION, ALLOGRAFT, PLATE;  Surgeon: Marybelle Killings, MD;  Location: [facility];  Service: Orthopedics;  Laterality: N/A;  . BONE CYST EXCISION     left leg  . INCISION AND DRAINAGE     hand  . TOTAL KNEE ARTHROPLASTY Left 03/16/2020   Procedure: LEFT TOTAL KNEE ARTHROPLASTY;  Surgeon: Mcarthur Rossetti, MD;  Location: [facility];  Service: Orthopedics;  Laterality: Left;    There were no vitals filed for this visit.   Subjective Assessment - 04/17/20 1005    Subjective PT states that he is in more pain than he has been since the surgery. He is taking medication for the pain that helps but in the mornings he can barely move.    Limitations Standing    How long can you sit comfortably? no issues    How long can you stand comfortably? limited    How long can you walk comfortably?  limited    Patient Stated Goals "walk out of this place"    Currently in Pain? Yes    Pain Score 2    this morning 4   Pain Location Knee    Pain Orientation Right    Pain Descriptors / Indicators Sore    Pain Type Acute pain    Pain Onset More than a month ago    Pain Frequency Intermittent    Aggravating Factors  night    Pain Relieving Factors meds    Effect of Pain on Daily Activities limits                             [facility] Adult PT Treatment/Exercise - 04/17/20 0001      Exercises   Exercises Knee/Hip      Knee/Hip Exercises: Stretches   Active Hamstring Stretch Left;3 reps;30 seconds    Passive Hamstring Stretch --    Passive Hamstring Stretch Limitations --    Quad Stretch Left;3 reps;30 seconds      Knee/Hip Exercises: Supine   Quad Sets Left;10 reps    Short Arc Quad Sets Left;10 reps    Heel Slides Left;10 reps    Knee Extension AROM;Left  Knee Extension Limitations 7    Knee Flexion AROM;Left    Knee Flexion Limitations 97      Knee/Hip Exercises: Prone   Hamstring Curl 10 reps    Contract/Relax to Increase Flexion 5x    Other Prone Exercises terminal extension x 10       Manual Therapy   Manual Therapy Edema management;Joint mobilization    Manual therapy comments completed seperate from all other aspects of treatmentt    Edema Management decongestive techniques for edema  control     Joint Mobilization patellar mobs                    PT Short Term Goals - 04/11/20 1009      PT SHORT TERM GOAL #1   Title Pt will demonstrate L knee AROM 0-95deg to improve gait mechanics and ambulation tolerance.    Baseline 04/09/20 - 6 to 102    Time 3    Period Weeks    Status Partially Met    Target Date 04/23/20      PT SHORT TERM GOAL #2   Title Pt will ambulate at 0.54ms without AD to return to golfing and work related activities.    Time 3    Period Weeks    Status On-going             PT Long Term Goals - 04/11/20  1009      PT LONG TERM GOAL #1   Title Pt will demonstrate L knee AROM 0-120deg to improve gait mechanics and ambulation tolerance.    Time 6    Period Weeks    Status On-going      PT LONG TERM GOAL #2   Title Pt will ambulate at 1.07m without AD to return to golfing and work related activities.    Time 6    Period Weeks    Status On-going      PT LONG TERM GOAL #3   Title Pt will perform 5x STS in 15 sec or less, no UE assisting, BLE equal to demo improved functional strength and ability to rise from seated surfaces.    Time 6    Period Weeks    Status On-going      PT LONG TERM GOAL #4   Title Pt will report no pain with extracurricular activities and sleeping to improve overall QoL.    Time 6    Period Weeks    Status On-going      PT LONG TERM GOAL #5   Title Pt will improve FOTO to <25% limited to return to PLOF.    Time 6    Period Weeks    Status On-going                 Plan - 04/17/20 1024    Clinical Impression Statement Pt complains of cramping at night making his pain significant in the mornings.  Therapist suggests to ice and complete active hamsting stretch prior to bed.  Treatment consisted of stretching and decongestive techniques to decrease pain.    Personal Factors and Comorbidities Time since onset of injury/illness/exacerbation    Examination-Activity Limitations Lift;Sleep;Squat    Examination-Participation Restrictions Yard Work    Stability/Clinical Decision Making Stable/Uncomplicated    Rehab Potential Good    PT Frequency 3x / week   decrease as appropriate; 20 visit limit   PT Duration 6 weeks    PT Treatment/Interventions ADLs/Self Care Home Management;Aquatic Therapy;Biofeedback;Cryotherapy;Electrical Stimulation;Moist Heat;Ultrasound;DME  Instruction;Gait training;Stair training;Functional mobility training;Therapeutic activities;Therapeutic exercise;Balance training;Neuromuscular re-education;Patient/family education;Orthotic  Fit/Training;Manual techniques;Manual lymph drainage;Scar mobilization;Passive range of motion;Dry needling;Taping;Joint Manipulations    PT Next Visit Plan Continue to focus on ROM, VMO strength/control, and manual for edema control; strengthening to improve AROM.    PT Home Exercise Plan Eval: quad set, seated HS stretch, standing weight-shifts, HHPT HEP; 04/11/20 - SAQ w/ add squeeze, SLR on elbows; 7/22: calf stretch, HS stretch, knee flexion stretch    Consulted and Agree with Plan of Care Patient           Patient will benefit from skilled therapeutic intervention in order to improve the following deficits and impairments:  Abnormal gait, Decreased activity tolerance, Decreased range of motion, Decreased strength, Difficulty walking, Hypomobility, Increased edema, Pain  Visit Diagnosis: Chronic pain of left knee  Stiffness of left knee, not elsewhere classified  Difficulty in walking, not elsewhere classified  Muscle weakness (generalized)     Problem List Patient Active Problem List   Diagnosis Date Noted  . Status post total left knee replacement 03/16/2020  . Unilateral primary osteoarthritis, left knee 08/31/2019  . Other spondylosis with myelopathy, cervical region 05/18/2019  . S/P cervical spinal fusion 05/18/2019  . Cervical stenosis of spine 05/09/2019  . Spinal stenosis of cervical region 05/04/2019    Rayetta Humphrey, PT CLT [phone] 04/17/2020, 10:50 AM  [place], [place], [address] Phone: [phone]   Fax:  [phone]  Name: Victor Conner MRN: [id number] Date of Birth: [birth year]

## 2020-04-17 NOTE — Telephone Encounter (Signed)
Please advise 

## 2020-04-18 ENCOUNTER — Encounter (HOSPITAL_COMMUNITY): Payer: Self-pay | Admitting: Physical Therapy

## 2020-04-18 ENCOUNTER — Ambulatory Visit (HOSPITAL_COMMUNITY): Payer: BLUE CROSS/BLUE SHIELD | Admitting: Physical Therapy

## 2020-04-18 DIAGNOSIS — G8929 Other chronic pain: Secondary | ICD-10-CM | POA: Diagnosis not present

## 2020-04-18 DIAGNOSIS — M6281 Muscle weakness (generalized): Secondary | ICD-10-CM | POA: Diagnosis not present

## 2020-04-18 DIAGNOSIS — R262 Difficulty in walking, not elsewhere classified: Secondary | ICD-10-CM

## 2020-04-18 DIAGNOSIS — M25662 Stiffness of left knee, not elsewhere classified: Secondary | ICD-10-CM

## 2020-04-18 DIAGNOSIS — M25562 Pain in left knee: Secondary | ICD-10-CM | POA: Diagnosis not present

## 2020-04-18 NOTE — Patient Instructions (Signed)
Access Code: [email or web address] URL: [email or web address] Date: 04/18/2020 Prepared by: Mitzi Hansen Dulcinea Kinser  Exercises Squat with Counter Support - 1 x daily - 7 x weekly - 2 sets - 10 reps

## 2020-04-18 NOTE — Therapy (Signed)
[place], [place], [address] Phone: [phone]   Fax:  [phone]  Physical Therapy Treatment  Patient Details  Name: Victor Conner MRN: [id number] Date of Birth: [birth year] Referring Provider (PT): Jean Rosenthal   Encounter Date: 04/18/2020   PT End of Session - 04/18/20 1039    Visit Number 8    Number of Visits 18    Date for PT Re-Evaluation 05/14/20    Authorization Type BCBS    Authorization Time Period 04/02/20 to 05/14/20    Authorization - Visit Number 8    Authorization - Number of Visits 20   20 visit limit   Progress Note Due on Visit 10    PT Start Time 8185    PT Stop Time 1122    PT Time Calculation (min) 47 min    Activity Tolerance Patient tolerated treatment well    Behavior During Therapy [facility] for tasks assessed/performed           Past Medical History:  Diagnosis Date  . Arthritis   . Hypercholesteremia   . Hypertension     Past Surgical History:  Procedure Laterality Date  . ANTERIOR CERVICAL DECOMP/DISCECTOMY FUSION N/A 05/09/2019   Procedure: C4-5, C5-6 ANTERIOR CERVICAL DECOMPRESSION/DISCECTOMY FUSION, ALLOGRAFT, PLATE;  Surgeon: Marybelle Killings, MD;  Location: [facility];  Service: Orthopedics;  Laterality: N/A;  . BONE CYST EXCISION     left leg  . INCISION AND DRAINAGE     hand  . TOTAL KNEE ARTHROPLASTY Left 03/16/2020   Procedure: LEFT TOTAL KNEE ARTHROPLASTY;  Surgeon: Mcarthur Rossetti, MD;  Location: [facility];  Service: Orthopedics;  Laterality: Left;    There were no vitals filed for this visit.   Subjective Assessment - 04/18/20 1034    Subjective Patient states he is not in as much pain today as yesterday. Ice and massage helped last night.    Limitations Standing    How long can you sit comfortably? no issues    How long can you stand comfortably? limited    How long can you walk comfortably? limited    Patient Stated Goals "walk out of this place"     Currently in Pain? Yes    Pain Score 1     Pain Location Knee    Pain Orientation Left    Pain Onset More than a month ago                             [facility] Adult PT Treatment/Exercise - 04/18/20 0001      Knee/Hip Exercises: Stretches   Passive Hamstring Stretch 2 reps;30 seconds    Knee: Self-Stretch to increase Flexion Left;10 seconds    Knee: Self-Stretch Limitations 10 reps on 12" step      Knee/Hip Exercises: Aerobic   Stationary Bike seat 21, rocking motion, 5 minutes      Knee/Hip Exercises: Standing   Functional Squat 2 sets;10 reps    Functional Squat Limitations with UE support      Knee/Hip Exercises: Supine   Quad Sets Left;10 reps    Quad Sets Limitations 5 second holds    Straight Leg Raises 2 sets;10 reps    Straight Leg Raises Limitations with quad set    Knee Extension AROM;Left    Knee Extension Limitations 7    Knee Flexion AROM;Left    Knee Flexion Limitations 99   improves to  106 following contract relax     Manual Therapy   Manual Therapy Muscle Energy Technique    Manual therapy comments completed seperate from all other aspects of treatmentt    Edema Management decongestive techniques for edema  control     Soft tissue mobilization scar mobilizations    Muscle Energy Technique contract relax for flexion 15 minutes with rest breaks                  PT Education - 04/18/20 1038    Education Details Continue HEP, exercise technique, scar mobilizations, heel prop for extension    Person(s) Educated Patient    Methods Explanation;Demonstration;Handout    Comprehension Verbalized understanding;Returned demonstration            PT Short Term Goals - 04/11/20 1009      PT SHORT TERM GOAL #1   Title Pt will demonstrate L knee AROM 0-95deg to improve gait mechanics and ambulation tolerance.    Baseline 04/09/20 - 6 to 102    Time 3    Period Weeks    Status Partially Met    Target Date 04/23/20      PT SHORT TERM  GOAL #2   Title Pt will ambulate at 0.73ms without AD to return to golfing and work related activities.    Time 3    Period Weeks    Status On-going             PT Long Term Goals - 04/11/20 1009      PT LONG TERM GOAL #1   Title Pt will demonstrate L knee AROM 0-120deg to improve gait mechanics and ambulation tolerance.    Time 6    Period Weeks    Status On-going      PT LONG TERM GOAL #2   Title Pt will ambulate at 1.0108m without AD to return to golfing and work related activities.    Time 6    Period Weeks    Status On-going      PT LONG TERM GOAL #3   Title Pt will perform 5x STS in 15 sec or less, no UE assisting, BLE equal to demo improved functional strength and ability to rise from seated surfaces.    Time 6    Period Weeks    Status On-going      PT LONG TERM GOAL #4   Title Pt will report no pain with extracurricular activities and sleeping to improve overall QoL.    Time 6    Period Weeks    Status On-going      PT LONG TERM GOAL #5   Title Pt will improve FOTO to <25% limited to return to PLOF.    Time 6    Period Weeks    Status On-going                 Plan - 04/18/20 1039    Clinical Impression Statement Patient tolerates contract relax technique in seated for improving knee flexion ROM without increase in symptoms. Patient progresses from 99 degrees of flexion to 106 following contract relax. Patient able to complete previously done stretches without cueing for positioning or mechanics. Patient requires min verbal cueing and demonstration for squatting mechanics with good carry over. Patient fatigued in quads following squatting and resisted TKE exercise. Completed manual for edema at end of session with increased tension in scar noted. Completed scar mobilizations and educated patient on completing at home. Patient also educated on  completing heel prop at home for increasing extension ROM while icing. Patient will continue to benefit from skilled  physical therapy in order to reduce impairment and improve function.    Personal Factors and Comorbidities Time since onset of injury/illness/exacerbation    Examination-Activity Limitations Lift;Sleep;Squat    Examination-Participation Restrictions Yard Work    Stability/Clinical Decision Making Stable/Uncomplicated    Rehab Potential Good    PT Frequency 3x / week   decrease as appropriate; 20 visit limit   PT Duration 6 weeks    PT Treatment/Interventions ADLs/Self Care Home Management;Aquatic Therapy;Biofeedback;Cryotherapy;Electrical Stimulation;Moist Heat;Ultrasound;DME Instruction;Gait training;Stair training;Functional mobility training;Therapeutic activities;Therapeutic exercise;Balance training;Neuromuscular re-education;Patient/family education;Orthotic Fit/Training;Manual techniques;Manual lymph drainage;Scar mobilization;Passive range of motion;Dry needling;Taping;Joint Manipulations    PT Next Visit Plan Continue to focus on ROM, VMO strength/control, and manual for edema control; strengthening to improve AROM.    PT Home Exercise Plan Eval: quad set, seated HS stretch, standing weight-shifts, HHPT HEP; 04/11/20 - SAQ w/ add squeeze, SLR on elbows; 7/22: calf stretch, HS stretch, knee flexion stretch 7/28 squat, scar mobs, Self stm, heel prop    Consulted and Agree with Plan of Care Patient           Patient will benefit from skilled therapeutic intervention in order to improve the following deficits and impairments:  Abnormal gait, Decreased activity tolerance, Decreased range of motion, Decreased strength, Difficulty walking, Hypomobility, Increased edema, Pain  Visit Diagnosis: Chronic pain of left knee  Stiffness of left knee, not elsewhere classified  Difficulty in walking, not elsewhere classified  Muscle weakness (generalized)     Problem List Patient Active Problem List   Diagnosis Date Noted  . Status post total left knee replacement 03/16/2020  . Unilateral  primary osteoarthritis, left knee 08/31/2019  . Other spondylosis with myelopathy, cervical region 05/18/2019  . S/P cervical spinal fusion 05/18/2019  . Cervical stenosis of spine 05/09/2019  . Spinal stenosis of cervical region 05/04/2019   11:35 AM, 04/18/20 Mearl Latin PT, DPT Physical Therapist at [place], [place], [address] Phone: [phone]   Fax:  [phone]  Name: Victor Conner MRN: [id number] Date of Birth: December 27, 1965

## 2020-04-20 ENCOUNTER — Encounter (HOSPITAL_COMMUNITY): Payer: Self-pay | Admitting: Physical Therapy

## 2020-04-20 ENCOUNTER — Ambulatory Visit (HOSPITAL_COMMUNITY): Payer: BLUE CROSS/BLUE SHIELD | Admitting: Physical Therapy

## 2020-04-20 ENCOUNTER — Other Ambulatory Visit: Payer: Self-pay

## 2020-04-20 DIAGNOSIS — R262 Difficulty in walking, not elsewhere classified: Secondary | ICD-10-CM | POA: Diagnosis not present

## 2020-04-20 DIAGNOSIS — M6281 Muscle weakness (generalized): Secondary | ICD-10-CM

## 2020-04-20 DIAGNOSIS — M25662 Stiffness of left knee, not elsewhere classified: Secondary | ICD-10-CM

## 2020-04-20 DIAGNOSIS — G8929 Other chronic pain: Secondary | ICD-10-CM | POA: Diagnosis not present

## 2020-04-20 DIAGNOSIS — M25562 Pain in left knee: Secondary | ICD-10-CM | POA: Diagnosis not present

## 2020-04-20 NOTE — Therapy (Signed)
[facility] [address] [place], [place], [address] Phone: [phone]   Fax:  [phone]  Physical Therapy Treatment  Patient Details  Name: Victor Conner MRN: [id number] Date of Birth: 08/31/1966 Referring Provider (PT): Christopher Blackman   Encounter Date: 04/20/2020   PT End of Session - 04/20/20 0923    Visit Number 9    Number of Visits 18    Date for PT Re-Evaluation 05/14/20    Authorization Type BCBS    Authorization Time Period 04/02/20 to 05/14/20    Authorization - Visit Number 9    Authorization - Number of Visits 20   20 visit limit   Progress Note Due on Visit 10    PT Start Time 0906    PT Stop Time 0945    PT Time Calculation (min) 39 min    Activity Tolerance Patient tolerated treatment well    Behavior During Therapy [facility] for tasks assessed/performed           Past Medical History:  Diagnosis Date  . Arthritis   . Hypercholesteremia   . Hypertension     Past Surgical History:  Procedure Laterality Date  . ANTERIOR CERVICAL DECOMP/DISCECTOMY FUSION N/A 05/09/2019   Procedure: C4-5, C5-6 ANTERIOR CERVICAL DECOMPRESSION/DISCECTOMY FUSION, ALLOGRAFT, PLATE;  Surgeon: Yates, Mark C, MD;  Location: [facility];  Service: Orthopedics;  Laterality: N/A;  . BONE CYST EXCISION     left leg  . INCISION AND DRAINAGE     hand  . TOTAL KNEE ARTHROPLASTY Left 03/16/2020   Procedure: LEFT TOTAL KNEE ARTHROPLASTY;  Surgeon: Blackman, Christopher Y, MD;  Location: [facility];  Service: Orthopedics;  Laterality: Left;    There were no vitals filed for this visit.   Subjective Assessment - 04/20/20 0907    Subjective Patient reported the only pain he is having is some low back pain.    Limitations Standing    How long can you sit comfortably? no issues    How long can you stand comfortably? limited    How long can you walk comfortably? limited    Patient Stated Goals "walk out of this place"    Currently in Pain? Yes     Pain Score 1     Pain Location Back    Pain Orientation Lower    Pain Descriptors / Indicators Aching    Pain Type Chronic pain    Pain Onset More than a month ago                             [facility] Adult PT Treatment/Exercise - 04/20/20 0001      Knee/Hip Exercises: Stretches   Passive Hamstring Stretch 2 reps;30 seconds    Knee: Self-Stretch to increase Flexion Left;10 seconds    Knee: Self-Stretch Limitations 10 reps on 14" step      Knee/Hip Exercises: Aerobic   Stationary Bike seat 21, rocking motion, 3 minutes      Knee/Hip Exercises: Standing   Functional Squat 2 sets;10 reps    Functional Squat Limitations with UE support      Knee/Hip Exercises: Supine   Quad Sets Left;10 reps    Quad Sets Limitations 5 second holds    Straight Leg Raises 2 sets;10 reps    Straight Leg Raises Limitations with quad set    Knee Extension AROM;Left    Knee Extension Limitations 6    Knee Flexion AROM;Left      Knee Flexion Limitations 102      Manual Therapy   Manual Therapy Soft tissue mobilization;Edema management;Joint mobilization    Manual therapy comments completed seperate from all other aspects of treatmentt    Edema Management Retrograde massage to LT LE to reduce edema    Joint Mobilization patellar glides grade III all directions     Soft tissue mobilization scar mobilizations. STM to left hamstring muscles                    PT Short Term Goals - 04/11/20 1009      PT SHORT TERM GOAL #1   Title Pt will demonstrate L knee AROM 0-95deg to improve gait mechanics and ambulation tolerance.    Baseline 04/09/20 - 6 to 102    Time 3    Period Weeks    Status Partially Met    Target Date 04/23/20      PT SHORT TERM GOAL #2   Title Pt will ambulate at 0.8m/s without AD to return to golfing and work related activities.    Time 3    Period Weeks    Status On-going             PT Long Term Goals - 04/11/20 1009      PT LONG TERM GOAL #1    Title Pt will demonstrate L knee AROM 0-120deg to improve gait mechanics and ambulation tolerance.    Time 6    Period Weeks    Status On-going      PT LONG TERM GOAL #2   Title Pt will ambulate at 1.0m/s without AD to return to golfing and work related activities.    Time 6    Period Weeks    Status On-going      PT LONG TERM GOAL #3   Title Pt will perform 5x STS in 15 sec or less, no UE assisting, BLE equal to demo improved functional strength and ability to rise from seated surfaces.    Time 6    Period Weeks    Status On-going      PT LONG TERM GOAL #4   Title Pt will report no pain with extracurricular activities and sleeping to improve overall QoL.    Time 6    Period Weeks    Status On-going      PT LONG TERM GOAL #5   Title Pt will improve FOTO to <25% limited to return to PLOF.    Time 6    Period Weeks    Status On-going                 Plan - 04/20/20 0950    Clinical Impression Statement Focused on improving patient's left knee ROM this session. Patient's AROM ranged from 6-102 degrees this session. Performed STM to left hamstring muscles this session to reduce muscular restrictions and improve ROM. Patient would benefit from continued skilled physical therapy to progress towards reaching functional goals.    Personal Factors and Comorbidities Time since onset of injury/illness/exacerbation    Examination-Activity Limitations Lift;Sleep;Squat    Examination-Participation Restrictions Yard Work    Stability/Clinical Decision Making Stable/Uncomplicated    Rehab Potential Good    PT Frequency 3x / week   decrease as appropriate; 20 visit limit   PT Duration 6 weeks    PT Treatment/Interventions ADLs/Self Care Home Management;Aquatic Therapy;Biofeedback;Cryotherapy;Electrical Stimulation;Moist Heat;Ultrasound;DME Instruction;Gait training;Stair training;Functional mobility training;Therapeutic activities;Therapeutic exercise;Balance training;Neuromuscular  re-education;Patient/family education;Orthotic Fit/Training;Manual techniques;Manual lymph drainage;Scar mobilization;Passive   range of motion;Dry needling;Taping;Joint Manipulations    PT Next Visit Plan Continue to focus on ROM, VMO strength/control, and manual for edema control; strengthening to improve AROM.    PT Home Exercise Plan Eval: quad set, seated HS stretch, standing weight-shifts, HHPT HEP; 04/11/20 - SAQ w/ add squeeze, SLR on elbows; 7/22: calf stretch, HS stretch, knee flexion stretch 7/28 squat, scar mobs, Self stm, heel prop    Consulted and Agree with Plan of Care Patient           Patient will benefit from skilled therapeutic intervention in order to improve the following deficits and impairments:  Abnormal gait, Decreased activity tolerance, Decreased range of motion, Decreased strength, Difficulty walking, Hypomobility, Increased edema, Pain  Visit Diagnosis: Chronic pain of left knee  Stiffness of left knee, not elsewhere classified  Difficulty in walking, not elsewhere classified  Muscle weakness (generalized)     Problem List Patient Active Problem List   Diagnosis Date Noted  . Status post total left knee replacement 03/16/2020  . Unilateral primary osteoarthritis, left knee 08/31/2019  . Other spondylosis with myelopathy, cervical region 05/18/2019  . S/P cervical spinal fusion 05/18/2019  . Cervical stenosis of spine 05/09/2019  . Spinal stenosis of cervical region 05/04/2019   Macy Gill PT, DPT 9:51 AM, 04/20/20 [phone]  [facility] [address] [place], [place], [address] Phone: [phone]   Fax:  [phone]  Name: Victor Conner MRN: [id number] Date of Birth: 07/17/1966   

## 2020-04-23 ENCOUNTER — Other Ambulatory Visit: Payer: Self-pay

## 2020-04-23 ENCOUNTER — Ambulatory Visit (HOSPITAL_COMMUNITY): Payer: BLUE CROSS/BLUE SHIELD | Attending: Orthopaedic Surgery | Admitting: Physical Therapy

## 2020-04-23 ENCOUNTER — Encounter (HOSPITAL_COMMUNITY): Payer: Self-pay | Admitting: Physical Therapy

## 2020-04-23 DIAGNOSIS — M25662 Stiffness of left knee, not elsewhere classified: Secondary | ICD-10-CM | POA: Insufficient documentation

## 2020-04-23 DIAGNOSIS — G8929 Other chronic pain: Secondary | ICD-10-CM | POA: Insufficient documentation

## 2020-04-23 DIAGNOSIS — R262 Difficulty in walking, not elsewhere classified: Secondary | ICD-10-CM | POA: Diagnosis not present

## 2020-04-23 DIAGNOSIS — M6281 Muscle weakness (generalized): Secondary | ICD-10-CM | POA: Insufficient documentation

## 2020-04-23 DIAGNOSIS — M25562 Pain in left knee: Secondary | ICD-10-CM | POA: Diagnosis not present

## 2020-04-23 NOTE — Therapy (Signed)
[place], [place], [address] Phone: [phone]   Fax:  [phone]  Physical Therapy Treatment/Progress Note  Patient Details  Name: Victor Conner MRN: [id number] Date of Birth: [birth year] Referring Provider (PT): Jean Rosenthal   Encounter Date: 04/23/2020  Progress Note   Reporting Period 04/02/20 to 04/23/20   See note below for Objective Data and Assessment of Progress/Goals    PT End of Session - 04/23/20 0819    Visit Number 10    Number of Visits 18    Date for PT Re-Evaluation 05/14/20    Authorization Type BCBS    Authorization Time Period 04/02/20 to 05/14/20    Authorization - Visit Number 10    Authorization - Number of Visits 20   20 visit limit   Progress Note Due on Visit 20    PT Start Time 0818    PT Stop Time 0858    PT Time Calculation (min) 40 min    Activity Tolerance Patient tolerated treatment well    Behavior During Therapy [facility] for tasks assessed/performed           Past Medical History:  Diagnosis Date  . Arthritis   . Hypercholesteremia   . Hypertension     Past Surgical History:  Procedure Laterality Date  . ANTERIOR CERVICAL DECOMP/DISCECTOMY FUSION N/A 05/09/2019   Procedure: C4-5, C5-6 ANTERIOR CERVICAL DECOMPRESSION/DISCECTOMY FUSION, ALLOGRAFT, PLATE;  Surgeon: Marybelle Killings, MD;  Location: [place];  Service: Orthopedics;  Laterality: N/A;  . BONE CYST EXCISION     left leg  . INCISION AND DRAINAGE     hand  . TOTAL KNEE ARTHROPLASTY Left 03/16/2020   Procedure: LEFT TOTAL KNEE ARTHROPLASTY;  Surgeon: Mcarthur Rossetti, MD;  Location: [facility];  Service: Orthopedics;  Laterality: Left;    There were no vitals filed for this visit.   Subjective Assessment - 04/23/20 0818    Subjective Patient states he feels like things have been getting better. Every day seems to be getting better. His home exercises are going alright. Patient states less pain and better  motion lately. Patient states 60% with physical therapy intervention.    Limitations Standing    How long can you sit comfortably? no issues    How long can you stand comfortably? limited    How long can you walk comfortably? limited    Patient Stated Goals "walk out of this place"    Currently in Pain? No/denies    Pain Onset More than a month ago              [facility] PT Assessment - 04/23/20 0001      Assessment   Medical Diagnosis L TKA    Referring Provider (PT) Jean Rosenthal    Onset Date/Surgical Date 03/16/20    Next MD Visit Mid August    Prior Therapy Acute PT, HHPT      Precautions   Precautions None      Restrictions   Weight Bearing Restrictions No      Balance Screen   Has the patient fallen in the past 6 months No    Has the patient had a decrease in activity level because of a fear of falling?  No    Is the patient reluctant to leave their home because of a fear of falling?  No      Prior Function   Level of Independence Independent    Vocation Full time  employment    Engineer, structural    Leisure golfing      Cognition   Overall Cognitive Status Within Functional Limits for tasks assessed      Observation/Other Assessments   Observations Ambulates with SPC with limited knee extension ROM on L    Focus on Therapeutic Outcomes (FOTO)  22% limited      AROM   Left Knee Extension 6   lacking   Left Knee Flexion 113      Strength   Right Hip Flexion 5/5    Left Hip Flexion 5/5    Right Knee Flexion 5/5    Right Knee Extension 5/5    Left Knee Flexion 5/5    Left Knee Extension 5/5    Right Ankle Dorsiflexion 5/5    Left Ankle Dorsiflexion 5/5      Transfers   Five time sit to stand comments  7.75 seconds    Comments without UE use, slightly biased utllzing RLE      Ambulation/Gait   Ambulation/Gait Yes    Ambulation/Gait Assistance 6: Modified independent (Device/Increase time)    Ambulation Distance (Feet) 420 Feet      Assistive device None    Gait Pattern Decreased stance time - left;Decreased stride length;Decreased hip/knee flexion - left;Step-through pattern    Gait velocity 1.06 m/s    Gait Comments 2 mwt                         [facility] Adult PT Treatment/Exercise - 04/23/20 0001      Knee/Hip Exercises: Stretches   Passive Hamstring Stretch 2 reps;30 seconds      Knee/Hip Exercises: Aerobic   Stationary Bike seat 21, rocking motion initially progress to full revolutions, 5 minutes      Knee/Hip Exercises: Standing   Lateral Step Up Left;2 sets;10 reps;Hand Hold: 0;Step Height: 6"    Lateral Step Up Limitations eccentric control    Forward Step Up 15 reps;Left;Hand Hold: 0;Step Height: 6"    Functional Squat 2 sets;10 reps    Functional Squat Limitations with UE support      Knee/Hip Exercises: Supine   Straight Leg Raises 2 sets;10 reps    Straight Leg Raises Limitations with quad set    Knee Extension AROM;Left    Knee Extension Limitations 6    Knee Flexion AROM;Left    Knee Flexion Limitations 113                  PT Education - 04/23/20 0819    Education Details Continue HEP, exercise technique, progress note findings, heel prop for extension    Person(s) Educated Patient    Methods Explanation;Demonstration    Comprehension Verbalized understanding;Returned demonstration            PT Short Term Goals - 04/23/20 0847      PT SHORT TERM GOAL #1   Title Pt will demonstrate L knee AROM 0-95deg to improve gait mechanics and ambulation tolerance.    Baseline 04/09/20 - 6 to 102    Time 3    Period Weeks    Status Partially Met    Target Date 04/23/20      PT SHORT TERM GOAL #2   Title Pt will ambulate at 0.28ms without AD to return to golfing and work related activities.    Time 3    Period Weeks    Status Achieved  PT Long Term Goals - 04/23/20 0737      PT LONG TERM GOAL #1   Title Pt will demonstrate L knee AROM 0-120deg to  improve gait mechanics and ambulation tolerance.    Time 6    Period Weeks    Status On-going      PT LONG TERM GOAL #2   Title Pt will ambulate at 1.84ms without AD to return to golfing and work related activities.    Time 6    Period Weeks    Status Achieved      PT LONG TERM GOAL #3   Title Pt will perform 5x STS in 15 sec or less, no UE assisting, BLE equal to demo improved functional strength and ability to rise from seated surfaces.    Time 6    Period Weeks    Status Achieved      PT LONG TERM GOAL #4   Title Pt will report no pain with extracurricular activities and sleeping to improve overall QoL.    Time 6    Period Weeks    Status On-going      PT LONG TERM GOAL #5   Title Pt will improve FOTO to <25% limited to return to PLOF.    Time 6    Period Weeks    Status Achieved                 Plan - 04/23/20 0819    Clinical Impression Statement Patient able to complete full revolutions on bike today for dynamic warm up. Patient able to complete step down with intermittent UE support but demonstrates decreased eccentric strength and motor control. Patient has met 1/2 short term goals with remaining being partially met with improved flexion ROM but impaired extension ROM. He also showing improved gait speed leading to meeting both a short term and long term goal of improved speed with ambulation. He continues to ambulate with decrease knee extension ROM causing impaired gait pattern. Patient has met 3/5 long term goals with improve activity tolerance, improved functional strength and improved gait speed. Patient remains limited by end range motion, intermittent symptoms, edema, strength, and functional mobility. Patient will continue to benefit from skilled physical therapy in order to reduce impairment and improve function.    Personal Factors and Comorbidities Time since onset of injury/illness/exacerbation    Examination-Activity Limitations Lift;Sleep;Squat     Examination-Participation Restrictions Yard Work    Stability/Clinical Decision Making Stable/Uncomplicated    Rehab Potential Good    PT Frequency 3x / week   decrease as appropriate; 20 visit limit   PT Duration 6 weeks    PT Treatment/Interventions ADLs/Self Care Home Management;Aquatic Therapy;Biofeedback;Cryotherapy;Electrical Stimulation;Moist Heat;Ultrasound;DME Instruction;Gait training;Stair training;Functional mobility training;Therapeutic activities;Therapeutic exercise;Balance training;Neuromuscular re-education;Patient/family education;Orthotic Fit/Training;Manual techniques;Manual lymph drainage;Scar mobilization;Passive range of motion;Dry needling;Taping;Joint Manipulations    PT Next Visit Plan Continue to focus on ROM, VMO strength/control, and manual for edema control; strengthening to improve AROM.    PT Home Exercise Plan Eval: quad set, seated HS stretch, standing weight-shifts, HHPT HEP; 04/11/20 - SAQ w/ add squeeze, SLR on elbows; 7/22: calf stretch, HS stretch, knee flexion stretch 7/28 squat, scar mobs, Self stm, heel prop    Consulted and Agree with Plan of Care Patient           Patient will benefit from skilled therapeutic intervention in order to improve the following deficits and impairments:  Abnormal gait, Decreased activity tolerance, Decreased range of motion, Decreased strength, Difficulty walking, Hypomobility,  Increased edema, Pain  Visit Diagnosis: Chronic pain of left knee  Stiffness of left knee, not elsewhere classified  Difficulty in walking, not elsewhere classified  Muscle weakness (generalized)     Problem List Patient Active Problem List   Diagnosis Date Noted  . Status post total left knee replacement 03/16/2020  . Unilateral primary osteoarthritis, left knee 08/31/2019  . Other spondylosis with myelopathy, cervical region 05/18/2019  . S/P cervical spinal fusion 05/18/2019  . Cervical stenosis of spine 05/09/2019  . Spinal stenosis  of cervical region 05/04/2019    9:00 AM, 04/23/20 Mearl Latin PT, DPT Physical Therapist at [place], [place], [address] Phone: [phone]   Fax:  [phone]  Name: Victor Conner MRN: [id number] Date of Birth: [birth year]

## 2020-04-25 ENCOUNTER — Other Ambulatory Visit: Payer: Self-pay

## 2020-04-25 ENCOUNTER — Ambulatory Visit (HOSPITAL_COMMUNITY): Payer: BLUE CROSS/BLUE SHIELD | Admitting: Physical Therapy

## 2020-04-25 ENCOUNTER — Encounter (HOSPITAL_COMMUNITY): Payer: Self-pay | Admitting: Physical Therapy

## 2020-04-25 DIAGNOSIS — M25562 Pain in left knee: Secondary | ICD-10-CM

## 2020-04-25 DIAGNOSIS — M25662 Stiffness of left knee, not elsewhere classified: Secondary | ICD-10-CM | POA: Diagnosis not present

## 2020-04-25 DIAGNOSIS — G8929 Other chronic pain: Secondary | ICD-10-CM | POA: Diagnosis not present

## 2020-04-25 DIAGNOSIS — M6281 Muscle weakness (generalized): Secondary | ICD-10-CM | POA: Diagnosis not present

## 2020-04-25 DIAGNOSIS — R262 Difficulty in walking, not elsewhere classified: Secondary | ICD-10-CM | POA: Diagnosis not present

## 2020-04-25 NOTE — Therapy (Signed)
[place], [place], [address] Phone: [phone]   Fax:  [phone]  Physical Therapy Treatment  Patient Details  Name: Victor Conner MRN: [id number] Date of Birth: 1966/01/08 Referring Provider (PT): Jean Rosenthal   Encounter Date: 04/25/2020   PT End of Session - 04/25/20 1047    Visit Number 11    Number of Visits 18    Date for PT Re-Evaluation 05/14/20    Authorization Type BCBS    Authorization Time Period 04/02/20 to 05/14/20    Authorization - Visit Number 11    Authorization - Number of Visits 20   20 visit limit   Progress Note Due on Visit 20    PT Start Time 1046    PT Stop Time 1125    PT Time Calculation (min) 39 min    Activity Tolerance Patient tolerated treatment well    Behavior During Therapy [facility] for tasks assessed/performed           Past Medical History:  Diagnosis Date  . Arthritis   . Hypercholesteremia   . Hypertension     Past Surgical History:  Procedure Laterality Date  . ANTERIOR CERVICAL DECOMP/DISCECTOMY FUSION N/A 05/09/2019   Procedure: C4-5, C5-6 ANTERIOR CERVICAL DECOMPRESSION/DISCECTOMY FUSION, ALLOGRAFT, PLATE;  Surgeon: Marybelle Killings, MD;  Location: [place];  Service: Orthopedics;  Laterality: N/A;  . BONE CYST EXCISION     left leg  . INCISION AND DRAINAGE     hand  . TOTAL KNEE ARTHROPLASTY Left 03/16/2020   Procedure: LEFT TOTAL KNEE ARTHROPLASTY;  Surgeon: Mcarthur Rossetti, MD;  Location: [facility];  Service: Orthopedics;  Laterality: Left;    There were no vitals filed for this visit.   Subjective Assessment - 04/25/20 1050    Subjective States his knee feels stiff today, no pain. Mornings are the worse.    Limitations Standing    How long can you sit comfortably? no issues    How long can you stand comfortably? limited    How long can you walk comfortably? limited    Patient Stated Goals "walk out of this place"    Currently in Pain? Yes     Pain Score 5     Pain Location Knee    Pain Orientation Left    Pain Descriptors / Indicators Tightness    Pain Onset More than a month ago              [facility] PT Assessment - 04/25/20 0001      Assessment   Medical Diagnosis L TKA    Referring Provider (PT) Jean Rosenthal    Onset Date/Surgical Date 03/16/20    Next MD Visit 04/25/20                         [facility] Adult PT Treatment/Exercise - 04/25/20 0001      Knee/Hip Exercises: Stretches   Other Knee/Hip Stretches 12" step - knee flexion adn extension x20" holds x5 Each L       Knee/Hip Exercises: Standing   Heel Raises Both;3 sets;5 reps;5 seconds   holds and SLOW lower   Other Standing Knee Exercises rocker board lateral - slow and controlled 3x10 B ; TKE walking with 4 plates - forward and retro 2x5 Each with pull on L and 10" holds at end for stretch     Other Standing Knee Exercises staggered stance heel raise holds L  posterior 3x5 5" holds                     PT Short Term Goals - 04/23/20 0847      PT SHORT TERM GOAL #1   Title Pt will demonstrate L knee AROM 0-95deg to improve gait mechanics and ambulation tolerance.    Baseline 04/09/20 - 6 to 102    Time 3    Period Weeks    Status Partially Met    Target Date 04/23/20      PT SHORT TERM GOAL #2   Title Pt will ambulate at 0.20ms without AD to return to golfing and work related activities.    Time 3    Period Weeks    Status Achieved             PT Long Term Goals - 04/23/20 07939     PT LONG TERM GOAL #1   Title Pt will demonstrate L knee AROM 0-120deg to improve gait mechanics and ambulation tolerance.    Time 6    Period Weeks    Status On-going      PT LONG TERM GOAL #2   Title Pt will ambulate at 1.071m without AD to return to golfing and work related activities.    Time 6    Period Weeks    Status Achieved      PT LONG TERM GOAL #3   Title Pt will perform 5x STS in 15 sec or less, no UE assisting, BLE  equal to demo improved functional strength and ability to rise from seated surfaces.    Time 6    Period Weeks    Status Achieved      PT LONG TERM GOAL #4   Title Pt will report no pain with extracurricular activities and sleeping to improve overall QoL.    Time 6    Period Weeks    Status On-going      PT LONG TERM GOAL #5   Title Pt will improve FOTO to <25% limited to return to PLOF.    Time 6    Period Weeks    Status Achieved                 Plan - 04/25/20 1050    Clinical Impression Statement Focused on calf strength and TKE on this date. Patient tolerated this well with fatigue noted end of session but no pain. Decreased stiffness noted with exercises. Added calf exercises and cues for walking to HEP. Will continue to focus on functional mobility as tolerated by patient. Patient to follow up with MD tomorrow, progress note sent last session, will follow up with patient about MD visits next session.    Personal Factors and Comorbidities Time since onset of injury/illness/exacerbation    Examination-Activity Limitations Lift;Sleep;Squat    Examination-Participation Restrictions Yard Work    Stability/Clinical Decision Making Stable/Uncomplicated    Rehab Potential Good    PT Frequency 3x / week   decrease as appropriate; 20 visit limit   PT Duration 6 weeks    PT Treatment/Interventions ADLs/Self Care Home Management;Aquatic Therapy;Biofeedback;Cryotherapy;Electrical Stimulation;Moist Heat;Ultrasound;DME Instruction;Gait training;Stair training;Functional mobility training;Therapeutic activities;Therapeutic exercise;Balance training;Neuromuscular re-education;Patient/family education;Orthotic Fit/Training;Manual techniques;Manual lymph drainage;Scar mobilization;Passive range of motion;Dry needling;Taping;Joint Manipulations    PT Next Visit Plan f/u with MD visit; f/u with potential sciatic nerve pain and nerve stretches (pain reported in leg at night at end of last  session), Continue to focus on ROM, VMO strength/control, and  manual for edema control; strengthening to improve AROM.    PT Home Exercise Plan Eval: quad set, seated HS stretch, standing weight-shifts, HHPT HEP; 04/11/20 - SAQ w/ add squeeze, SLR on elbows; 7/22: calf stretch, HS stretch, knee flexion stretch 7/28 squat, scar mobs, Self stm, heel prop    Consulted and Agree with Plan of Care Patient           Patient will benefit from skilled therapeutic intervention in order to improve the following deficits and impairments:  Abnormal gait, Decreased activity tolerance, Decreased range of motion, Decreased strength, Difficulty walking, Hypomobility, Increased edema, Pain  Visit Diagnosis: Chronic pain of left knee  Stiffness of left knee, not elsewhere classified  Difficulty in walking, not elsewhere classified  Muscle weakness (generalized)     Problem List Patient Active Problem List   Diagnosis Date Noted  . Status post total left knee replacement 03/16/2020  . Unilateral primary osteoarthritis, left knee 08/31/2019  . Other spondylosis with myelopathy, cervical region 05/18/2019  . S/P cervical spinal fusion 05/18/2019  . Cervical stenosis of spine 05/09/2019  . Spinal stenosis of cervical region 05/04/2019   11:30 AM, 04/25/20 Jerene Pitch, DPT Physical Therapy with [facility]  [phone] office  [place] [address] [place], [place], [address] Phone: [phone]   Fax:  [phone]  Name: Victor Conner MRN: [id number] Date of Birth: Jun 21, 1966

## 2020-04-26 ENCOUNTER — Encounter: Payer: Self-pay | Admitting: Orthopaedic Surgery

## 2020-04-26 ENCOUNTER — Ambulatory Visit (INDEPENDENT_AMBULATORY_CARE_PROVIDER_SITE_OTHER): Payer: BLUE CROSS/BLUE SHIELD | Admitting: Orthopaedic Surgery

## 2020-04-26 DIAGNOSIS — Z96652 Presence of left artificial knee joint: Secondary | ICD-10-CM

## 2020-04-26 MED ORDER — OXYCODONE HCL 5 MG PO TABS: 5.0000 mg | ORAL_TABLET | Freq: Four times a day (QID) | ORAL | 0 refills | Status: DC | PRN

## 2020-04-26 MED ORDER — TIZANIDINE HCL 4 MG PO TABS
4.0000 mg | ORAL_TABLET | Freq: Three times a day (TID) | ORAL | 1 refills | Status: DC | PRN
Start: 2020-04-26 — End: 2021-06-28

## 2020-04-26 NOTE — Progress Notes (Signed)
Victor Conner is now 6 weeks status post a left total knee arthroplasty.  He is ambulate with a cane and doing better range of motion and strength.  He says they have been able to flex him 215 degrees and physical therapy.  He is still needing occasional oxycodone.  He said the Robaxin does not work she would like to have something different for muscle spasms.  Overall he is doing well.  He would like to start him on some golf balls.  On exam his left knee is still swollen to be expected at this time postoperative.  His extension is almost full and his flexion is to past 90 degrees.  The knee feels ligamentously stable.  Plan I will continue his oxycodone because using it less but he still needing it.  I will send in some Zanaflex as a muscle relaxant to see if this will help.  All questions and concerns were answered and addressed.  We will see him back in 4 weeks for repeat evaluation but no x-rays are needed.  He will continue to increase his activities as comfort allows.

## 2020-04-27 ENCOUNTER — Telehealth (HOSPITAL_COMMUNITY): Payer: Self-pay | Admitting: Physical Therapy

## 2020-04-27 ENCOUNTER — Ambulatory Visit (HOSPITAL_COMMUNITY): Payer: BLUE CROSS/BLUE SHIELD | Admitting: Physical Therapy

## 2020-04-27 NOTE — Telephone Encounter (Signed)
No Show. Called and spoke with patient who had to go out of town and couldn't find clinic number. Confirmed next apt with patient.  10:22 AM, 04/27/20 Jerene Pitch, DPT Physical Therapy with [facility]  [phone] office

## 2020-05-01 ENCOUNTER — Ambulatory Visit (HOSPITAL_COMMUNITY): Payer: BLUE CROSS/BLUE SHIELD

## 2020-05-01 ENCOUNTER — Encounter (HOSPITAL_COMMUNITY): Payer: Self-pay

## 2020-05-01 ENCOUNTER — Other Ambulatory Visit: Payer: Self-pay

## 2020-05-01 DIAGNOSIS — G8929 Other chronic pain: Secondary | ICD-10-CM

## 2020-05-01 DIAGNOSIS — M6281 Muscle weakness (generalized): Secondary | ICD-10-CM

## 2020-05-01 DIAGNOSIS — R262 Difficulty in walking, not elsewhere classified: Secondary | ICD-10-CM | POA: Diagnosis not present

## 2020-05-01 DIAGNOSIS — M25662 Stiffness of left knee, not elsewhere classified: Secondary | ICD-10-CM | POA: Diagnosis not present

## 2020-05-01 DIAGNOSIS — M25562 Pain in left knee: Secondary | ICD-10-CM | POA: Diagnosis not present

## 2020-05-01 NOTE — Therapy (Signed)
[place], [place], [address] Phone: [phone]   Fax:  [phone]  Physical Therapy Treatment  Patient Details  Name: Victor Conner MRN: [id number] Date of Birth: [birth year] Referring Provider (PT): Jean Rosenthal   Encounter Date: 05/01/2020   PT End of Session - 05/01/20 0933    Visit Number 12    Number of Visits 18    Date for PT Re-Evaluation 05/14/20    Authorization Type BCBS    Authorization Time Period 04/02/20 to 05/14/20    Authorization - Visit Number 12    Authorization - Number of Visits 20    Progress Note Due on Visit 20    PT Start Time 0918    PT Stop Time 1000    PT Time Calculation (min) 42 min    Activity Tolerance Patient tolerated treatment well    Behavior During Therapy [facility] for tasks assessed/performed           Past Medical History:  Diagnosis Date  . Arthritis   . Hypercholesteremia   . Hypertension     Past Surgical History:  Procedure Laterality Date  . ANTERIOR CERVICAL DECOMP/DISCECTOMY FUSION N/A 05/09/2019   Procedure: C4-5, C5-6 ANTERIOR CERVICAL DECOMPRESSION/DISCECTOMY FUSION, ALLOGRAFT, PLATE;  Surgeon: Marybelle Killings, MD;  Location: [place];  Service: Orthopedics;  Laterality: N/A;  . BONE CYST EXCISION     left leg  . INCISION AND DRAINAGE     hand  . TOTAL KNEE ARTHROPLASTY Left 03/16/2020   Procedure: LEFT TOTAL KNEE ARTHROPLASTY;  Surgeon: Mcarthur Rossetti, MD;  Location: [facility];  Service: Orthopedics;  Laterality: Left;    There were no vitals filed for this visit.   Subjective Assessment - 05/01/20 0926    Subjective Pt reports he played 18 holes Friday and scored 88 points.  Reports knee and back are stiff following surgery.  Reports MD is happy with knee progress, has been given change in muscle relaxor medication, has not began yet.    Patient Stated Goals "walk out of this place"    Currently in Pain? No/denies    Pain Location --    knee and back stiffness   Pain Descriptors / Indicators Tightness   knee and back stiffness   Pain Type Chronic pain    Pain Onset More than a month ago    Pain Frequency Intermittent    Aggravating Factors  night    Pain Relieving Factors meds    Effect of Pain on Daily Activities limits                             [facility] Adult PT Treatment/Exercise - 05/01/20 0001      Ambulation/Gait   Ambulation/Gait Yes    Ambulation/Gait Assistance 6: Modified independent (Device/Increase time)    Ambulation Distance (Feet) 226 Feet    Assistive device None    Gait Pattern Decreased stance time - left;Decreased stride length;Decreased hip/knee flexion - left;Step-through pattern    Gait Comments Cueing for increased stride length, heel to toe mechanics      Knee/Hip Exercises: Stretches   Knee: Self-Stretch to increase Flexion 5 reps;20 seconds    Knee: Self-Stretch Limitations knee drive on 2nd step for flexion      Knee/Hip Exercises: Standing   Heel Raises 15 reps;5 seconds    Heel Raises Limitations staggered stance    Forward Lunges Left;2 sets;5 reps  Forward Lunges Limitations over foam, cueing for posterior knee bent to progress to kneeling wiht work, no weight bearing    Terminal Knee Extension Left;10 reps;Other (comment)    Terminal Knee Extension Limitations Purple theraband retro gait then hold 10"    Functional Squat 10 reps    Rocker Board 2 minutes    Rocker Board Limitations lateral and DF/PF    SLS                      PT Short Term Goals - 04/23/20 0847      PT SHORT TERM GOAL #1   Title Pt will demonstrate L knee AROM 0-95deg to improve gait mechanics and ambulation tolerance.    Baseline 04/09/20 - 6 to 102    Time 3    Period Weeks    Status Partially Met    Target Date 04/23/20      PT SHORT TERM GOAL #2   Title Pt will ambulate at 0.27ms without AD to return to golfing and work related activities.    Time 3    Period Weeks      Status Achieved             PT Long Term Goals - 04/23/20 00962     PT LONG TERM GOAL #1   Title Pt will demonstrate L knee AROM 0-120deg to improve gait mechanics and ambulation tolerance.    Time 6    Period Weeks    Status On-going      PT LONG TERM GOAL #2   Title Pt will ambulate at 1.04m without AD to return to golfing and work related activities.    Time 6    Period Weeks    Status Achieved      PT LONG TERM GOAL #3   Title Pt will perform 5x STS in 15 sec or less, no UE assisting, BLE equal to demo improved functional strength and ability to rise from seated surfaces.    Time 6    Period Weeks    Status Achieved      PT LONG TERM GOAL #4   Title Pt will report no pain with extracurricular activities and sleeping to improve overall QoL.    Time 6    Period Weeks    Status On-going      PT LONG TERM GOAL #5   Title Pt will improve FOTO to <25% limited to return to PLOF.    Time 6    Period Weeks    Status Achieved                 Plan - 05/01/20 1818    Clinical Impression Statement Continued session focus on knee mobility and quad strengthening to improve TKE.  Lunges added for functional strengthening with some demonstration and cueing to improve mechanics.  No reports of increased pain through sessoin.    Personal Factors and Comorbidities Time since onset of injury/illness/exacerbation    Examination-Activity Limitations Lift;Sleep;Squat    Examination-Participation Restrictions Yard Work    Stability/Clinical Decision Making Stable/Uncomplicated    Clinical Decision Making Low    Rehab Potential Good    PT Frequency 3x / week   decrease as appropriate, 20 visit limit   PT Duration 6 weeks    PT Treatment/Interventions ADLs/Self Care Home Management;Aquatic Therapy;Biofeedback;Cryotherapy;Electrical Stimulation;Moist Heat;Ultrasound;DME Instruction;Gait training;Stair training;Functional mobility training;Therapeutic activities;Therapeutic  exercise;Balance training;Neuromuscular re-education;Patient/family education;Orthotic Fit/Training;Manual techniques;Manual lymph drainage;Scar mobilization;Passive range of motion;Dry needling;Taping;Joint Manipulations  PT Next Visit Plan f/u with potential sciatic nerve pain and nerve stretches (pain reported in leg at night at end of last session), Continue to focus on ROM, VMO strength/control, and manual for edema control; strengthening to improve AROM.           Patient will benefit from skilled therapeutic intervention in order to improve the following deficits and impairments:  Abnormal gait, Decreased activity tolerance, Decreased range of motion, Decreased strength, Difficulty walking, Hypomobility, Increased edema, Pain  Visit Diagnosis: Stiffness of left knee, not elsewhere classified  Difficulty in walking, not elsewhere classified  Muscle weakness (generalized)  Chronic pain of left knee     Problem List Patient Active Problem List   Diagnosis Date Noted  . Status post total left knee replacement 03/16/2020  . Unilateral primary osteoarthritis, left knee 08/31/2019  . Other spondylosis with myelopathy, cervical region 05/18/2019  . S/P cervical spinal fusion 05/18/2019  . Cervical stenosis of spine 05/09/2019  . Spinal stenosis of cervical region 05/04/2019   Ihor Austin, LPTA/CLT; CBIS [phone]  Aldona Lento 05/01/2020, 6:28 PM  [place], [place], [address] Phone: [phone]   Fax:  [phone]  Name: Victor Conner MRN: [id number] Date of Birth: Jul 08, 1966

## 2020-05-02 ENCOUNTER — Ambulatory Visit (HOSPITAL_COMMUNITY): Payer: BLUE CROSS/BLUE SHIELD

## 2020-05-02 ENCOUNTER — Encounter (HOSPITAL_COMMUNITY): Payer: Self-pay

## 2020-05-02 DIAGNOSIS — M25662 Stiffness of left knee, not elsewhere classified: Secondary | ICD-10-CM | POA: Diagnosis not present

## 2020-05-02 DIAGNOSIS — G8929 Other chronic pain: Secondary | ICD-10-CM | POA: Diagnosis not present

## 2020-05-02 DIAGNOSIS — R262 Difficulty in walking, not elsewhere classified: Secondary | ICD-10-CM | POA: Diagnosis not present

## 2020-05-02 DIAGNOSIS — M25562 Pain in left knee: Secondary | ICD-10-CM

## 2020-05-02 DIAGNOSIS — M6281 Muscle weakness (generalized): Secondary | ICD-10-CM

## 2020-05-02 NOTE — Therapy (Signed)
[place], [place], [address] Phone: [phone]   Fax:  [phone]  Physical Therapy Treatment  Patient Details  Name: Victor Conner MRN: [id number] Date of Birth: Mar 17, 1966 Referring Provider (PT): Jean Rosenthal   Encounter Date: 05/02/2020   PT End of Session - 05/02/20 0926    Visit Number 13    Number of Visits 18    Date for PT Re-Evaluation 05/14/20    Authorization Type BCBS    Authorization Time Period 04/02/20 to 05/14/20    Authorization - Visit Number 13    Authorization - Number of Visits 20    Progress Note Due on Visit 20    PT Start Time 0917    PT Stop Time 1000    PT Time Calculation (min) 43 min    Activity Tolerance Patient tolerated treatment well    Behavior During Therapy [facility] for tasks assessed/performed           Past Medical History:  Diagnosis Date  . Arthritis   . Hypercholesteremia   . Hypertension     Past Surgical History:  Procedure Laterality Date  . ANTERIOR CERVICAL DECOMP/DISCECTOMY FUSION N/A 05/09/2019   Procedure: C4-5, C5-6 ANTERIOR CERVICAL DECOMPRESSION/DISCECTOMY FUSION, ALLOGRAFT, PLATE;  Surgeon: Marybelle Killings, MD;  Location: [place];  Service: Orthopedics;  Laterality: N/A;  . BONE CYST EXCISION     left leg  . INCISION AND DRAINAGE     hand  . TOTAL KNEE ARTHROPLASTY Left 03/16/2020   Procedure: LEFT TOTAL KNEE ARTHROPLASTY;  Surgeon: Mcarthur Rossetti, MD;  Location: [facility];  Service: Orthopedics;  Laterality: Left;    There were no vitals filed for this visit.   Subjective Assessment - 05/02/20 0923    Subjective Pt stated he accidently took muscule relaxor vs pain pills yesterday morning.  Reports he took a long nap upon arriving home and slept good last night.  No reports of pain currenty.    Patient Stated Goals "walk out of this place"    Currently in Pain? No/denies                             [facility] Adult PT  Treatment/Exercise - 05/02/20 0001      Ambulation/Gait   Ambulation/Gait Yes    Ambulation/Gait Assistance 6: Modified independent (Device/Increase time)    Ambulation Distance (Feet) 226 Feet    Assistive device None    Gait Pattern Decreased stance time - left;Decreased stride length;Decreased hip/knee flexion - left;Step-through pattern    Gait Comments Cueing for increased stride length, heel to toe mechanics      Knee/Hip Exercises: Stretches   Knee: Self-Stretch to increase Flexion 5 reps;20 seconds    Knee: Self-Stretch Limitations knee drive on 2nd step for flexion    Gastroc Stretch Both;3 reps;30 seconds    Gastroc Stretch Limitations slant board      Knee/Hip Exercises: Standing   Heel Raises 15 reps;5 seconds    Heel Raises Limitations staggered stance; incline    Terminal Knee Extension Left;10 reps;Other (comment)    Terminal Knee Extension Limitations Purple theraband retro gait then hold 10"    Other Standing Knee Exercises heel walking 1RT      Knee/Hip Exercises: Supine   Knee Extension AROM;Left    Knee Extension Limitations 5    Knee Flexion AROM;Left    Knee Flexion Limitations 120  Manual Therapy   Manual Therapy Edema management    Manual therapy comments Manual complete separate than rest of tx    Edema Management decongestive technique with LE elevated                    PT Short Term Goals - 04/23/20 0847      PT SHORT TERM GOAL #1   Title Pt will demonstrate L knee AROM 0-95deg to improve gait mechanics and ambulation tolerance.    Baseline 04/09/20 - 6 to 102    Time 3    Period Weeks    Status Partially Met    Target Date 04/23/20      PT SHORT TERM GOAL #2   Title Pt will ambulate at 0.41ms without AD to return to golfing and work related activities.    Time 3    Period Weeks    Status Achieved             PT Long Term Goals - 04/23/20 00254     PT LONG TERM GOAL #1   Title Pt will demonstrate L knee AROM 0-120deg  to improve gait mechanics and ambulation tolerance.    Time 6    Period Weeks    Status On-going      PT LONG TERM GOAL #2   Title Pt will ambulate at 1.049m without AD to return to golfing and work related activities.    Time 6    Period Weeks    Status Achieved      PT LONG TERM GOAL #3   Title Pt will perform 5x STS in 15 sec or less, no UE assisting, BLE equal to demo improved functional strength and ability to rise from seated surfaces.    Time 6    Period Weeks    Status Achieved      PT LONG TERM GOAL #4   Title Pt will report no pain with extracurricular activities and sleeping to improve overall QoL.    Time 6    Period Weeks    Status On-going      PT LONG TERM GOAL #5   Title Pt will improve FOTO to <25% limited to return to PLOF.    Time 6    Period Weeks    Status Achieved                 Plan - 05/02/20 1030    Clinical Impression Statement Session focus primarly wiht knee mobility and quad strengthening to improve TKE.  Pt progressing well with improved gait mechanics following verbal cueing to improve ankle mechanics and equal stride length.  Pt continues to be limited wiht edema present proximal knee, manual decongestive techniques complete to assist with edema control for knee mobility.  AROM improving 5-120 degrees.  No reports of pain through session.    Personal Factors and Comorbidities Time since onset of injury/illness/exacerbation    Examination-Activity Limitations Lift;Sleep;Squat    Examination-Participation Restrictions Yard Work    Stability/Clinical Decision Making Stable/Uncomplicated    Clinical Decision Making Low    Rehab Potential Good    PT Frequency 3x / week   decreased as appropriate, limit 20 visits   PT Duration 6 weeks    PT Treatment/Interventions ADLs/Self Care Home Management;Aquatic Therapy;Biofeedback;Cryotherapy;Electrical Stimulation;Moist Heat;Ultrasound;DME Instruction;Gait training;Stair training;Functional mobility  training;Therapeutic activities;Therapeutic exercise;Balance training;Neuromuscular re-education;Patient/family education;Orthotic Fit/Training;Manual techniques;Manual lymph drainage;Scar mobilization;Passive range of motion;Dry needling;Taping;Joint Manipulations    PT Next Visit Plan Add prone  TKE possibly prone knee hang.  Focus on TKE, VMO strength/control and manual for edeme control PRN.  f/u wiht potential sciatic nerve pain and nerve stretches (pain reported in leg at night at end of last session),    PT Home Exercise Plan Eval: quad set, seated HS stretch, standing weight-shifts, HHPT HEP; 04/11/20 - SAQ w/ add squeeze, SLR on elbows; 7/22: calf stretch, HS stretch, knee flexion stretch 7/28 squat, scar mobs, Self stm, heel prop           Patient will benefit from skilled therapeutic intervention in order to improve the following deficits and impairments:  Abnormal gait, Decreased activity tolerance, Decreased range of motion, Decreased strength, Difficulty walking, Hypomobility, Increased edema, Pain  Visit Diagnosis: Stiffness of left knee, not elsewhere classified  Difficulty in walking, not elsewhere classified  Muscle weakness (generalized)  Chronic pain of left knee     Problem List Patient Active Problem List   Diagnosis Date Noted  . Status post total left knee replacement 03/16/2020  . Unilateral primary osteoarthritis, left knee 08/31/2019  . Other spondylosis with myelopathy, cervical region 05/18/2019  . S/P cervical spinal fusion 05/18/2019  . Cervical stenosis of spine 05/09/2019  . Spinal stenosis of cervical region 05/04/2019   Ihor Austin, LPTA/CLT; CBIS [phone] Aldona Lento 05/02/2020, 12:23 PM  [place], [place], [address] Phone: [phone]   Fax:  [phone]  Name: Victor Conner MRN: [id number] Date of Birth: [birth year]

## 2020-05-04 ENCOUNTER — Ambulatory Visit (HOSPITAL_COMMUNITY): Payer: BLUE CROSS/BLUE SHIELD | Admitting: Physical Therapy

## 2020-05-04 ENCOUNTER — Other Ambulatory Visit: Payer: Self-pay

## 2020-05-04 ENCOUNTER — Telehealth (HOSPITAL_COMMUNITY): Payer: Self-pay | Admitting: Physical Therapy

## 2020-05-04 DIAGNOSIS — M25562 Pain in left knee: Secondary | ICD-10-CM | POA: Diagnosis not present

## 2020-05-04 DIAGNOSIS — R262 Difficulty in walking, not elsewhere classified: Secondary | ICD-10-CM | POA: Diagnosis not present

## 2020-05-04 DIAGNOSIS — M6281 Muscle weakness (generalized): Secondary | ICD-10-CM | POA: Diagnosis not present

## 2020-05-04 DIAGNOSIS — M25662 Stiffness of left knee, not elsewhere classified: Secondary | ICD-10-CM

## 2020-05-04 DIAGNOSIS — G8929 Other chronic pain: Secondary | ICD-10-CM | POA: Diagnosis not present

## 2020-05-04 NOTE — Therapy (Signed)
[place], [place], [address] Phone: [phone]   Fax:  [phone]  Physical Therapy Treatment  Patient Details  Name: Victor Conner MRN: [id number] Date of Birth: September 19, 1966 Referring Provider (PT): Jean Rosenthal   Encounter Date: 05/04/2020   PT End of Session - 05/04/20 1006    Visit Number 14    Number of Visits 18    Date for PT Re-Evaluation 05/14/20    Authorization Type BCBS    Authorization Time Period 04/02/20 to 05/14/20    Authorization - Visit Number 14    Authorization - Number of Visits 20    Progress Note Due on Visit 20    PT Start Time 0958    PT Stop Time 1040    PT Time Calculation (min) 42 min    Activity Tolerance Patient tolerated treatment well    Behavior During Therapy [facility] for tasks assessed/performed           Past Medical History:  Diagnosis Date  . Arthritis   . Hypercholesteremia   . Hypertension     Past Surgical History:  Procedure Laterality Date  . ANTERIOR CERVICAL DECOMP/DISCECTOMY FUSION N/A 05/09/2019   Procedure: C4-5, C5-6 ANTERIOR CERVICAL DECOMPRESSION/DISCECTOMY FUSION, ALLOGRAFT, PLATE;  Surgeon: Marybelle Killings, MD;  Location: [place];  Service: Orthopedics;  Laterality: N/A;  . BONE CYST EXCISION     left leg  . INCISION AND DRAINAGE     hand  . TOTAL KNEE ARTHROPLASTY Left 03/16/2020   Procedure: LEFT TOTAL KNEE ARTHROPLASTY;  Surgeon: Mcarthur Rossetti, MD;  Location: [facility];  Service: Orthopedics;  Laterality: Left;    There were no vitals filed for this visit.   Subjective Assessment - 05/04/20 0957    Subjective PT states that his knee is doing well; the mornings take a while to going.  He is going to a fishing tournament this weekend    Patient Stated Goals "walk out of this place"    Currently in Pain? No/denies                             [facility] Adult PT Treatment/Exercise - 05/04/20 0001      Exercises    Exercises Knee/Hip      Knee/Hip Exercises: Stretches   Active Hamstring Stretch Left;3 reps;30 seconds    Passive Hamstring Stretch 2 reps;30 seconds    Knee: Self-Stretch to increase Flexion 5 reps;20 seconds    Knee: Self-Stretch Limitations knee drive on 2nd step for flexion    Gastroc Stretch Both;3 reps;30 seconds    Gastroc Stretch Limitations slant board      Knee/Hip Exercises: Standing   Heel Raises 15 reps;5 seconds    Heel Raises Limitations one hand     Knee Flexion Both;10 reps    Terminal Knee Extension Left;10 reps;Other (comment)    Terminal Knee Extension Limitations --    Rocker Board 2 minutes    SLS with Vectors 10" x 3     Other Standing Knee Exercises --      Knee/Hip Exercises: Supine   Knee Extension AROM;Left    Knee Extension Limitations 7    Knee Flexion AROM;Left    Knee Flexion Limitations 123      Knee/Hip Exercises: Prone   Hip Extension Left;10 reps    Other Prone Exercises terminal extension x 10      Manual Therapy  Manual Therapy Edema management    Manual therapy comments Manual complete separate than rest of tx    Edema Management decongestive technique with LE elevated                    PT Short Term Goals - 04/23/20 0847      PT SHORT TERM GOAL #1   Title Pt will demonstrate L knee AROM 0-95deg to improve gait mechanics and ambulation tolerance.    Baseline 04/09/20 - 6 to 102    Time 3    Period Weeks    Status Partially Met    Target Date 04/23/20      PT SHORT TERM GOAL #2   Title Pt will ambulate at 0.35ms without AD to return to golfing and work related activities.    Time 3    Period Weeks    Status Achieved             PT Long Term Goals - 04/23/20 09562     PT LONG TERM GOAL #1   Title Pt will demonstrate L knee AROM 0-120deg to improve gait mechanics and ambulation tolerance.    Time 6    Period Weeks    Status On-going      PT LONG TERM GOAL #2   Title Pt will ambulate at 1.0104m without AD  to return to golfing and work related activities.    Time 6    Period Weeks    Status Achieved      PT LONG TERM GOAL #3   Title Pt will perform 5x STS in 15 sec or less, no UE assisting, BLE equal to demo improved functional strength and ability to rise from seated surfaces.    Time 6    Period Weeks    Status Achieved      PT LONG TERM GOAL #4   Title Pt will report no pain with extracurricular activities and sleeping to improve overall QoL.    Time 6    Period Weeks    Status On-going      PT LONG TERM GOAL #5   Title Pt will improve FOTO to <25% limited to return to PLOF.    Time 6    Period Weeks    Status Achieved                 Plan - 05/04/20 1024    Clinical Impression Statement Added vector stances; active hamstring stretching and prone exercises to promote extension in Lt knee.  PT improving well and should be ready for discharge at target date.  ROM continues to improve    Personal Factors and Comorbidities Time since onset of injury/illness/exacerbation    Examination-Activity Limitations Lift;Sleep;Squat    Examination-Participation Restrictions Yard Work    Stability/Clinical Decision Making Stable/Uncomplicated    Rehab Potential Good    PT Frequency 3x / week   decreased as appropriate, limit 20 visits   PT Duration 6 weeks    PT Treatment/Interventions ADLs/Self Care Home Management;Aquatic Therapy;Biofeedback;Cryotherapy;Electrical Stimulation;Moist Heat;Ultrasound;DME Instruction;Gait training;Stair training;Functional mobility training;Therapeutic activities;Therapeutic exercise;Balance training;Neuromuscular re-education;Patient/family education;Orthotic Fit/Training;Manual techniques;Manual lymph drainage;Scar mobilization;Passive range of motion;Dry needling;Taping;Joint Manipulations    PT Next Visit Plan Possible reassess for discharge if pt can feel comfortable walking without cane.  Pt wants to be done as soon as possible but will continue PT as  long as we feel he needs.    PT Home Exercise Plan Eval: quad set, seated HS stretch,  standing weight-shifts, HHPT HEP; 04/11/20 - SAQ w/ add squeeze, SLR on elbows; 7/22: calf stretch, HS stretch, knee flexion stretch 7/28 squat, scar mobs, Self stm, heel prop           Patient will benefit from skilled therapeutic intervention in order to improve the following deficits and impairments:  Abnormal gait, Decreased activity tolerance, Decreased range of motion, Decreased strength, Difficulty walking, Hypomobility, Increased edema, Pain  Visit Diagnosis: Stiffness of left knee, not elsewhere classified  Difficulty in walking, not elsewhere classified     Problem List Patient Active Problem List   Diagnosis Date Noted  . Status post total left knee replacement 03/16/2020  . Unilateral primary osteoarthritis, left knee 08/31/2019  . Other spondylosis with myelopathy, cervical region 05/18/2019  . S/P cervical spinal fusion 05/18/2019  . Cervical stenosis of spine 05/09/2019  . Spinal stenosis of cervical region 05/04/2019   Rayetta Humphrey, PT CLT [phone] 05/04/2020, 10:46 AM  [place], [place], [address] Phone: [phone]   Fax:  [phone]  Name: Victor Conner MRN: [id number] Date of Birth: 10/25/1965

## 2020-05-04 NOTE — Telephone Encounter (Signed)
pt cancelled appt for 8/16 and 8/18 because he will be on vacation

## 2020-05-07 ENCOUNTER — Encounter (HOSPITAL_COMMUNITY): Payer: BLUE CROSS/BLUE SHIELD | Admitting: Physical Therapy

## 2020-05-09 ENCOUNTER — Encounter (HOSPITAL_COMMUNITY): Payer: BLUE CROSS/BLUE SHIELD | Admitting: Physical Therapy

## 2020-05-10 ENCOUNTER — Telehealth: Payer: Self-pay | Admitting: Orthopaedic Surgery

## 2020-05-10 NOTE — Telephone Encounter (Signed)
Patient aware of the below message  

## 2020-05-10 NOTE — Telephone Encounter (Signed)
Patient called requesting a call back. Patient stated is it ok for him to get covid vaccination and is it ok for him to do outside outings as in biking riding, golfing, ect. Please call back back about this matter. patient phone number is [phone].

## 2020-05-10 NOTE — Telephone Encounter (Signed)
Yes.  He can go ahead and have his Covid vaccination and can increase his activities with outdoor sports as comfort allows.

## 2020-05-11 ENCOUNTER — Other Ambulatory Visit: Payer: Self-pay

## 2020-05-11 ENCOUNTER — Ambulatory Visit (HOSPITAL_COMMUNITY): Payer: BLUE CROSS/BLUE SHIELD

## 2020-05-11 ENCOUNTER — Other Ambulatory Visit: Payer: Self-pay | Admitting: Orthopaedic Surgery

## 2020-05-11 ENCOUNTER — Encounter (HOSPITAL_COMMUNITY): Payer: Self-pay

## 2020-05-11 DIAGNOSIS — R262 Difficulty in walking, not elsewhere classified: Secondary | ICD-10-CM | POA: Diagnosis not present

## 2020-05-11 DIAGNOSIS — M25662 Stiffness of left knee, not elsewhere classified: Secondary | ICD-10-CM | POA: Diagnosis not present

## 2020-05-11 DIAGNOSIS — M6281 Muscle weakness (generalized): Secondary | ICD-10-CM | POA: Diagnosis not present

## 2020-05-11 DIAGNOSIS — M25562 Pain in left knee: Secondary | ICD-10-CM | POA: Diagnosis not present

## 2020-05-11 DIAGNOSIS — G8929 Other chronic pain: Secondary | ICD-10-CM | POA: Diagnosis not present

## 2020-05-11 NOTE — Telephone Encounter (Signed)
Pls advise.  

## 2020-05-12 NOTE — Therapy (Addendum)
[place], [place], [address] Phone: [phone]   Fax:  [phone]   PHYSICAL THERAPY DISCHARGE SUMMARY  Visits from Start of Care: 15  Current functional level related to goals / functional outcomes: See below   Remaining deficits: AROM 4-123 degrees   Education / Equipment: HEP, gait training Plan: Patient agrees to discharge.  Patient goals were partially met. Patient is being discharged due to being pleased with the current functional level.  ?????     Evaluating therapist reviewed and agrees with discharging pt from therapy at this time due to pt progress, knowledge of HEP and gait pattern, and pt in agreement with plan.  Talbot Grumbling PT, DPT 05/16/20, 9:09 AM [phone]   Physical Therapy Treatment  Patient Details  Name: Victor Conner MRN: [id number] Date of Birth: Jan 16, 1966 Referring Provider (PT): Jean Rosenthal   Encounter Date: 05/11/2020   PT End of Session - 05/11/20 0922    Visit Number 15    Number of Visits 18    Date for PT Re-Evaluation 05/14/20    Authorization Type BCBS    Authorization Time Period 04/02/20 to 05/14/20    Authorization - Visit Number 15    Authorization - Number of Visits 20    Progress Note Due on Visit 20    PT Start Time 0920    PT Stop Time 0958    PT Time Calculation (min) 38 min    Activity Tolerance Patient tolerated treatment well    Behavior During Therapy [facility] for tasks assessed/performed           Past Medical History:  Diagnosis Date  . Arthritis   . Hypercholesteremia   . Hypertension     Past Surgical History:  Procedure Laterality Date  . ANTERIOR CERVICAL DECOMP/DISCECTOMY FUSION N/A 05/09/2019   Procedure: C4-5, C5-6 ANTERIOR CERVICAL DECOMPRESSION/DISCECTOMY FUSION, ALLOGRAFT, PLATE;  Surgeon: Marybelle Killings, MD;  Location: [facility];  Service: Orthopedics;  Laterality: N/A;  . BONE CYST EXCISION     left leg  . INCISION  AND DRAINAGE     hand  . TOTAL KNEE ARTHROPLASTY Left 03/16/2020   Procedure: LEFT TOTAL KNEE ARTHROPLASTY;  Surgeon: Mcarthur Rossetti, MD;  Location: [facility];  Service: Orthopedics;  Laterality: Left;    There were no vitals filed for this visit.   Subjective Assessment - 05/11/20 0922    Subjective Pt reports driving 5 1/2 hours then played 9 holes of golf and won game yesterday.  Pt reports tournanment biggest catch was 30#.  Pt reports soreness in lower back, no pain in the knee just stiffness.    Patient Stated Goals "walk out of this place"    Currently in Pain? No/denies                 05/11/20 0001  Assessment  Medical Diagnosis L TKA  Referring Provider (PT) Jean Rosenthal  Onset Date/Surgical Date 03/16/20  Next MD Visit 05/23/20  Prior Therapy Acute PT, HHPT  Observation/Other Assessments  Focus on Therapeutic Outcomes (FOTO)  9% limited (was 22% limited)  Functional Tests  Functional tests Sit to Stand  Sit to Stand  Comments 5x STS 5.75" no HHA was 19.5" (was 5x STS: 19.5 sec, BUE assisting, LLE extended)  ROM / Strength  AROM / PROM / Strength AROM;Strength  AROM  Left Knee Flexion 123 (was 113)  Left Knee Extension 4 (was 6)  AROM Assessment Site Knee  Right/Left Knee Left  Strength  Left Knee Flexion 5/5  Left Knee Extension 5/5  Right Hip Flexion 5/5  Right Hip Extension 4+/5 (was 4+/5)  Right Hip ABduction 5/5  Left Hip Flexion 5/5  Left Hip Extension 4+/5 (was 4+/5)  Left Hip ABduction 5/5  Right Knee Flexion 5/5  Right Knee Extension 5/5  Right Ankle Dorsiflexion 5/5  Right Ankle Plantar Flexion 5/5  Left Ankle Dorsiflexion 5/5  Left Ankle Plantar Flexion 5/5  Transfers  Five time sit to stand comments  5.75" (was 7.75 seconds)  Comments no HHA  Ambulation/Gait  Ambulation/Gait Yes  Ambulation/Gait Assistance 7: Independent  Ambulation Distance (Feet) 452 Feet  Assistive device None  Gait Pattern Decreased stance  time - left;Decreased stride length;Decreased hip/knee flexion - left;Step-through pattern  Ambulation Surface Level;Indoor  Gait velocity 1.14 m/s  Gait Comments 2MWT        05/11/20 0001  Transfers  Five time sit to stand comments  5.75" (was 7.75 seconds)  Comments no HHA  Ambulation/Gait  Ambulation/Gait Yes  Ambulation/Gait Assistance 7: Independent  Ambulation Distance (Feet) 452 Feet  Assistive device None  Gait Pattern Decreased stance time - left;Decreased stride length;Decreased hip/knee flexion - left;Step-through pattern  Ambulation Surface Level;Indoor  Gait velocity 1.14 m/s  Gait Comments 2MWT  Knee/Hip Exercises: Stretches  Active Hamstring Stretch Left;3 reps;30 seconds  Knee: Self-Stretch to increase Flexion 5 reps;20 seconds  Knee: Self-Stretch Limitations knee drive on 2nd step for flexion  Gastroc Stretch Both;3 reps;30 seconds  Gastroc Stretch Limitations slant board  Knee/Hip Exercises: Standing  Gait Training 2MWT  Knee/Hip Exercises: Supine  Knee Extension AROM;Left  Knee Extension Limitations 4  Knee Flexion AROM;Left  Knee Flexion Limitations 123             PT Education - 05/12/20 1343    Education Details Reviewed importance of edema control for pain control and knee mobiility.  Reviewed HEP and self care technqiues.    Person(s) Educated Patient    Methods Explanation;Demonstration    Comprehension Verbalized understanding;Returned demonstration            PT Short Term Goals - 05/11/20 0940      PT SHORT TERM GOAL #1   Title Pt will demonstrate L knee AROM 0-95deg to improve gait mechanics and ambulation tolerance.    Baseline 05/11/20: AROM 4-123 degrees 04/09/20 - 6 to 102    Status Partially Met      PT SHORT TERM GOAL #2   Title Pt will ambulate at 0.74ms without AD to return to golfing and work related activities.    Baseline 8/20:  1.14 m/s no AD.  Reports returned to golfing and won game yesterday.  Has returned to  work.    Status Achieved             PT Long Term Goals - 05/11/20 06270     PT LONG TERM GOAL #1   Title Pt will demonstrate L knee AROM 0-120deg to improve gait mechanics and ambulation tolerance.    Baseline 05/11/20: AROM 4-123 degrees    Status Partially Met      PT LONG TERM GOAL #2   Title Pt will ambulate at 1.066m without AD to return to golfing and work related activities.    Baseline 8/20:  1.14 m/s no AD.  Reports returned to golfing and won game yesterday.  Has returned to work.    Status Achieved      PT LONG  TERM GOAL #3   Title Pt will perform 5x STS in 15 sec or less, no UE assisting, BLE equal to demo improved functional strength and ability to rise from seated surfaces.    Baseline 05/11/20:    Status Achieved      PT LONG TERM GOAL #4   Title Pt will report no pain with extracurricular activities and sleeping to improve overall QoL.    Baseline 05/11/20:  Reports ability to sleep for 3 nights and sleeping for 5 hrs without waking for pain.  Has returned to fishing tournament and golfing.    Status Achieved      PT LONG TERM GOAL #5   Title Pt will improve FOTO to <25% limited to return to PLOF.    Baseline 05/11/20:  Limited by 9% was 22%    Status Achieved                 Plan - 05/12/20 1336    Clinical Impression Statement Pt has met 1/2 STGs and LTG 4/5s.  Pt reports ability to return to recreational activities including fishing tournament and won a 9 hole golf game yesterday.  FOTO score at 9% limitation (was 22%) indicating improved self-percieved functional abilities.  AROM at 4-123 degrees and strength all 5/5 except for hip extension at 4+/5.  Reports ability to sleep for 5 hours prior awakening due to pain.  Reviewed importance of HEP compliance wiht verbalized understanding.    Personal Factors and Comorbidities Time since onset of injury/illness/exacerbation    Examination-Activity Limitations Lift;Sleep;Squat    Examination-Participation  Restrictions Yard Work    Stability/Clinical Decision Making Stable/Uncomplicated    Clinical Decision Making Low    Rehab Potential Good    PT Frequency 3x / week    PT Duration 6 weeks    PT Treatment/Interventions ADLs/Self Care Home Management;Aquatic Therapy;Biofeedback;Cryotherapy;Electrical Stimulation;Moist Heat;Ultrasound;DME Instruction;Gait training;Stair training;Functional mobility training;Therapeutic activities;Therapeutic exercise;Balance training;Neuromuscular re-education;Patient/family education;Orthotic Fit/Training;Manual techniques;Manual lymph drainage;Scar mobilization;Passive range of motion;Dry needling;Taping;Joint Manipulations    PT Next Visit Plan DC to HEP.    PT Home Exercise Plan Eval: quad set, seated HS stretch, standing weight-shifts, HHPT HEP; 04/11/20 - SAQ w/ add squeeze, SLR on elbows; 7/22: calf stretch, HS stretch, knee flexion stretch 7/28 squat, scar mobs, Self stm, heel prop           Patient will benefit from skilled therapeutic intervention in order to improve the following deficits and impairments:  Abnormal gait, Decreased activity tolerance, Decreased range of motion, Decreased strength, Difficulty walking, Hypomobility, Increased edema, Pain  Visit Diagnosis: Stiffness of left knee, not elsewhere classified  Difficulty in walking, not elsewhere classified  Muscle weakness (generalized)  Chronic pain of left knee     Problem List Patient Active Problem List   Diagnosis Date Noted  . Status post total left knee replacement 03/16/2020  . Unilateral primary osteoarthritis, left knee 08/31/2019  . Other spondylosis with myelopathy, cervical region 05/18/2019  . S/P cervical spinal fusion 05/18/2019  . Cervical stenosis of spine 05/09/2019  . Spinal stenosis of cervical region 05/04/2019   Ihor Austin, LPTA/CLT; CBIS [phone]  Aldona Lento 05/12/2020, 1:45 PM  [place], [place], [address] Phone: [phone]   Fax:  [phone]  Name: VADA YELLEN MRN: [id number] Date of Birth: 1965-12-12

## 2020-05-14 ENCOUNTER — Encounter (HOSPITAL_COMMUNITY): Payer: BLUE CROSS/BLUE SHIELD

## 2020-05-16 ENCOUNTER — Encounter (HOSPITAL_COMMUNITY): Payer: BLUE CROSS/BLUE SHIELD

## 2020-05-18 ENCOUNTER — Encounter (HOSPITAL_COMMUNITY): Payer: BLUE CROSS/BLUE SHIELD

## 2020-05-23 ENCOUNTER — Other Ambulatory Visit: Payer: Self-pay

## 2020-05-23 ENCOUNTER — Encounter: Payer: Self-pay | Admitting: Orthopaedic Surgery

## 2020-05-23 ENCOUNTER — Ambulatory Visit (INDEPENDENT_AMBULATORY_CARE_PROVIDER_SITE_OTHER): Payer: BLUE CROSS/BLUE SHIELD | Admitting: Orthopaedic Surgery

## 2020-05-23 DIAGNOSIS — Z96652 Presence of left artificial knee joint: Secondary | ICD-10-CM

## 2020-05-23 MED ORDER — OXYCODONE HCL 5 MG PO TABS: 5.0000 mg | ORAL_TABLET | Freq: Four times a day (QID) | ORAL | 0 refills | Status: DC | PRN

## 2020-05-23 NOTE — Progress Notes (Signed)
The patient is a very pleasant 54 year old gentleman who is between 9 and 10 weeks status post a left total knee arthroplasty.  He states he is doing well overall.  He just has pain at bedtime.  He is back to work as well.  He has played golf recently and even got a hole in 1 while playing.  He is very pleased overall.  On examination of his left operative knee, there is still swelling to be expected.  His extension is almost full and his flexion is almost full.  The knee feels ligamentously stable.  His calf is soft.  This point he will continue to increase his activities as comfort allows.  We really do not need to see him back for 6 months unless there is issues.  At that visit I would like an AP and lateral of his left knee.  All questions and concerns were answered and addressed.

## 2020-06-06 ENCOUNTER — Other Ambulatory Visit: Payer: Self-pay | Admitting: Orthopaedic Surgery

## 2020-06-11 ENCOUNTER — Other Ambulatory Visit: Payer: Self-pay | Admitting: Orthopaedic Surgery

## 2020-06-11 ENCOUNTER — Telehealth: Payer: Self-pay | Admitting: Orthopaedic Surgery

## 2020-06-11 MED ORDER — OXYCODONE HCL 5 MG PO TABS: 5.0000 mg | ORAL_TABLET | Freq: Four times a day (QID) | ORAL | 0 refills | Status: DC | PRN

## 2020-06-11 NOTE — Telephone Encounter (Signed)
Patient called requesting a refill of oxycodone. Please send to pharmacy on file. Please call patient when medication has been sent in. Patient phone number is [phone].

## 2020-06-11 NOTE — Telephone Encounter (Signed)
Please advise 

## 2020-07-04 ENCOUNTER — Other Ambulatory Visit: Payer: Self-pay | Admitting: Orthopaedic Surgery

## 2020-07-04 MED ORDER — HYDROCODONE-ACETAMINOPHEN 5-325 MG PO TABS: 1.0000 | ORAL_TABLET | Freq: Four times a day (QID) | ORAL | 0 refills | Status: DC | PRN

## 2020-07-04 NOTE — Telephone Encounter (Signed)
Please advise 

## 2020-07-04 NOTE — Telephone Encounter (Signed)
Since it has been over 3 months since his surgery, I need to send in hydrocodone instead of oxycodone at this standpoint.  He still needs to try to wean from narcotics.

## 2020-07-27 ENCOUNTER — Telehealth: Payer: Self-pay | Admitting: Orthopaedic Surgery

## 2020-07-27 ENCOUNTER — Other Ambulatory Visit: Payer: Self-pay | Admitting: Orthopaedic Surgery

## 2020-07-27 MED ORDER — HYDROCODONE-ACETAMINOPHEN 5-325 MG PO TABS: 1.0000 | ORAL_TABLET | Freq: Four times a day (QID) | ORAL | 0 refills | Status: DC | PRN

## 2020-07-27 NOTE — Telephone Encounter (Signed)
Patient called. He would like the hydrocodone called in for pain. His call back number is [phone]

## 2020-07-27 NOTE — Telephone Encounter (Signed)
I can only prescribe and refill 1 of these narcotics.  We can refill both narcotics.  Find out which one he wants refilled because we cannot legally provide both.

## 2020-07-27 NOTE — Telephone Encounter (Signed)
I sent in some

## 2020-07-27 NOTE — Telephone Encounter (Signed)
Lvm for pt to call back to discuss  

## 2020-07-27 NOTE — Telephone Encounter (Signed)
Pt informed

## 2020-11-15 ENCOUNTER — Telehealth: Payer: Self-pay | Admitting: Orthopaedic Surgery

## 2020-11-15 NOTE — Telephone Encounter (Signed)
Pt had surgery in 02/2020 and has a dentist appt on 11/19/20 @ 8:30, the dentist would like to know if pt will require an antibiotic? If so they ask our office call it in and then notify the pt.   [phone]

## 2020-11-15 NOTE — Telephone Encounter (Signed)
Pt called and informed that he only needs antibiotics per Dr. Ninfa Linden for 3 months after surgery. Pt stated understanding

## 2020-11-20 ENCOUNTER — Ambulatory Visit (INDEPENDENT_AMBULATORY_CARE_PROVIDER_SITE_OTHER): Payer: BC Managed Care – PPO | Admitting: Orthopaedic Surgery

## 2020-11-20 ENCOUNTER — Ambulatory Visit: Payer: Self-pay

## 2020-11-20 ENCOUNTER — Encounter: Payer: Self-pay | Admitting: Orthopaedic Surgery

## 2020-11-20 DIAGNOSIS — Z96652 Presence of left artificial knee joint: Secondary | ICD-10-CM | POA: Diagnosis not present

## 2020-11-20 NOTE — Progress Notes (Signed)
The patient is well-known to me.  We replaced his left knee in June of last year.  This is a regular follow-up for him.  He reports great knee range of motion and strength and he is very satisfied overall.  His right knee does cause him some problems from time to time and he has known moderate arthritis of that knee but is not bothering him enough to do anything.  He is also developing some low back pain that is daily but he is working on weight loss.  Examination of his left operative knee shows a well-healed surgical incision.  His extension and flexion are full.  He only has mild swelling.  His right knee has no effusion with varus malalignment.  There is some slight medial joint line tenderness and some slight patellofemoral crepitation.  At this point I did talk about weight loss and quad strengthening exercises as well as core strengthening.  He is to work on his posture as well.  Follow-up for his left operative knee will be as needed of the standpoint.  He knows to come see if there is any issues with that left knee.  If his right knee starts to worsen he will let us know.  All questions and concerns were answered and addressed.

## 2020-12-29 IMAGING — DX DG KNEE 1-2V PORT*L*
2 series · 2 of 2 positions shown · non-contrast
Comparison: No prior.

CLINICAL DATA: Total left knee replacement.

EXAM:
PORTABLE LEFT KNEE - 1-2 VIEW

[knee ap]
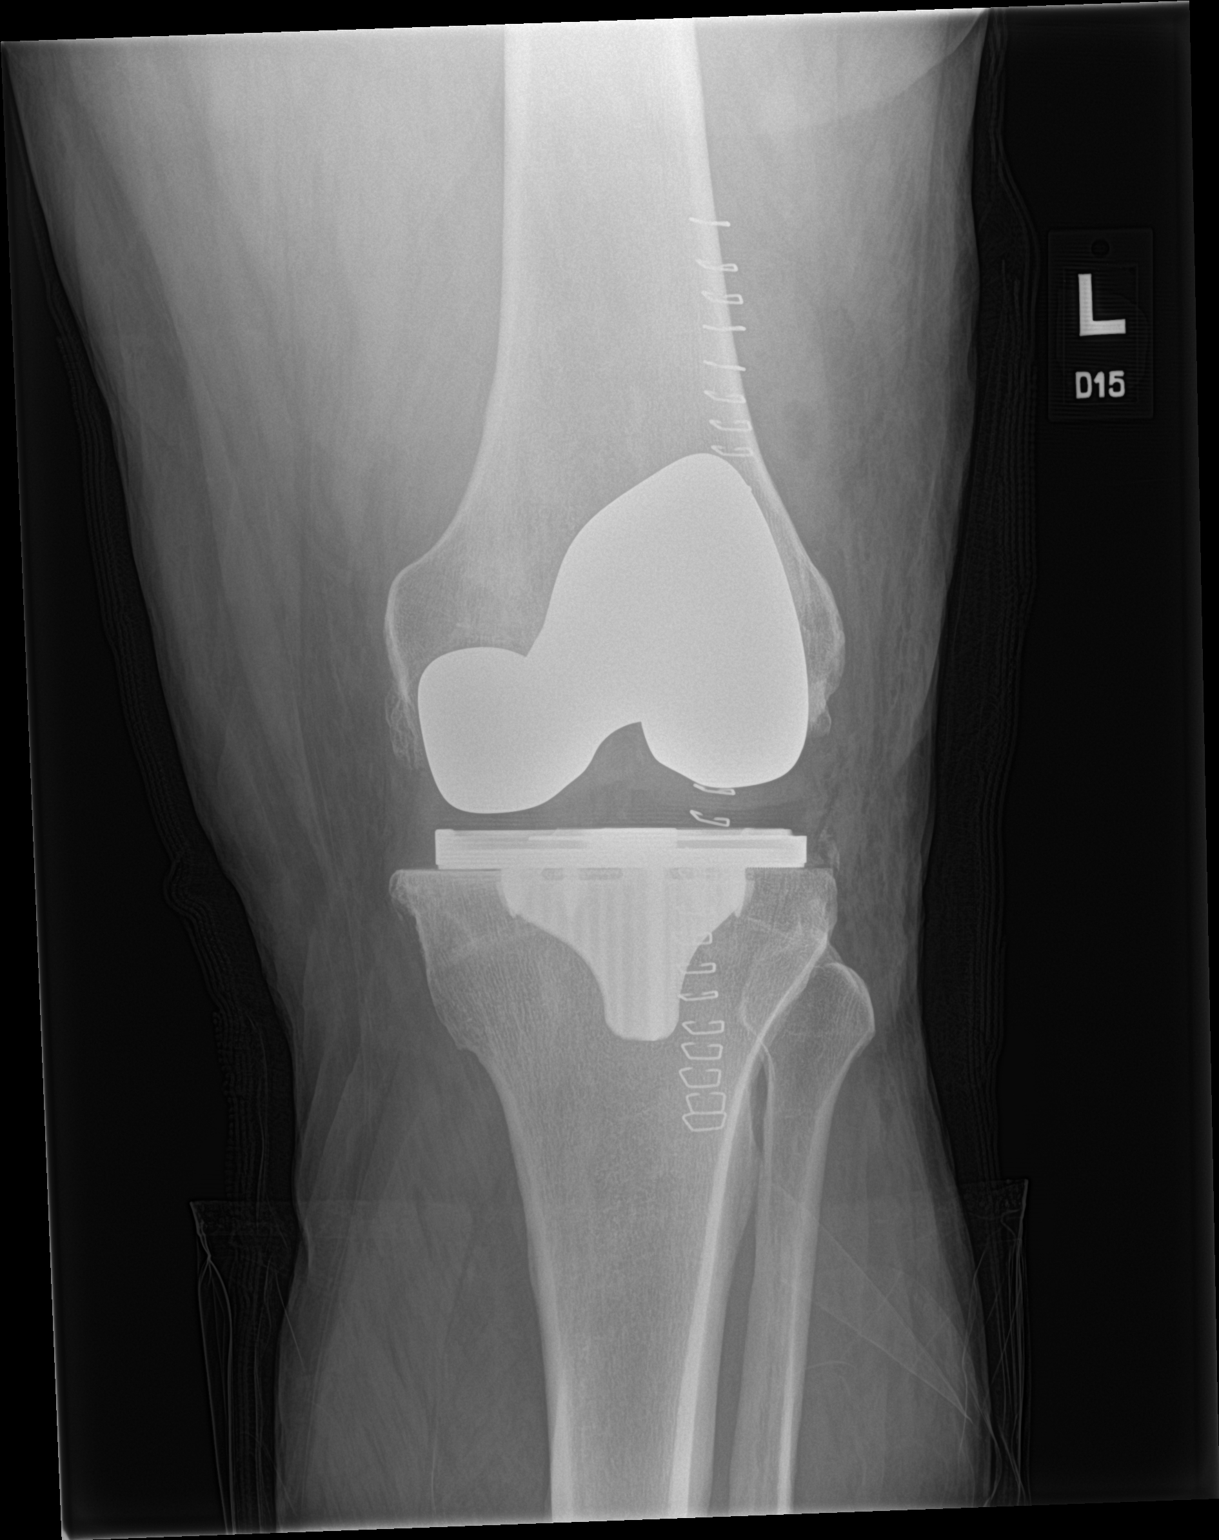

[knee lat]
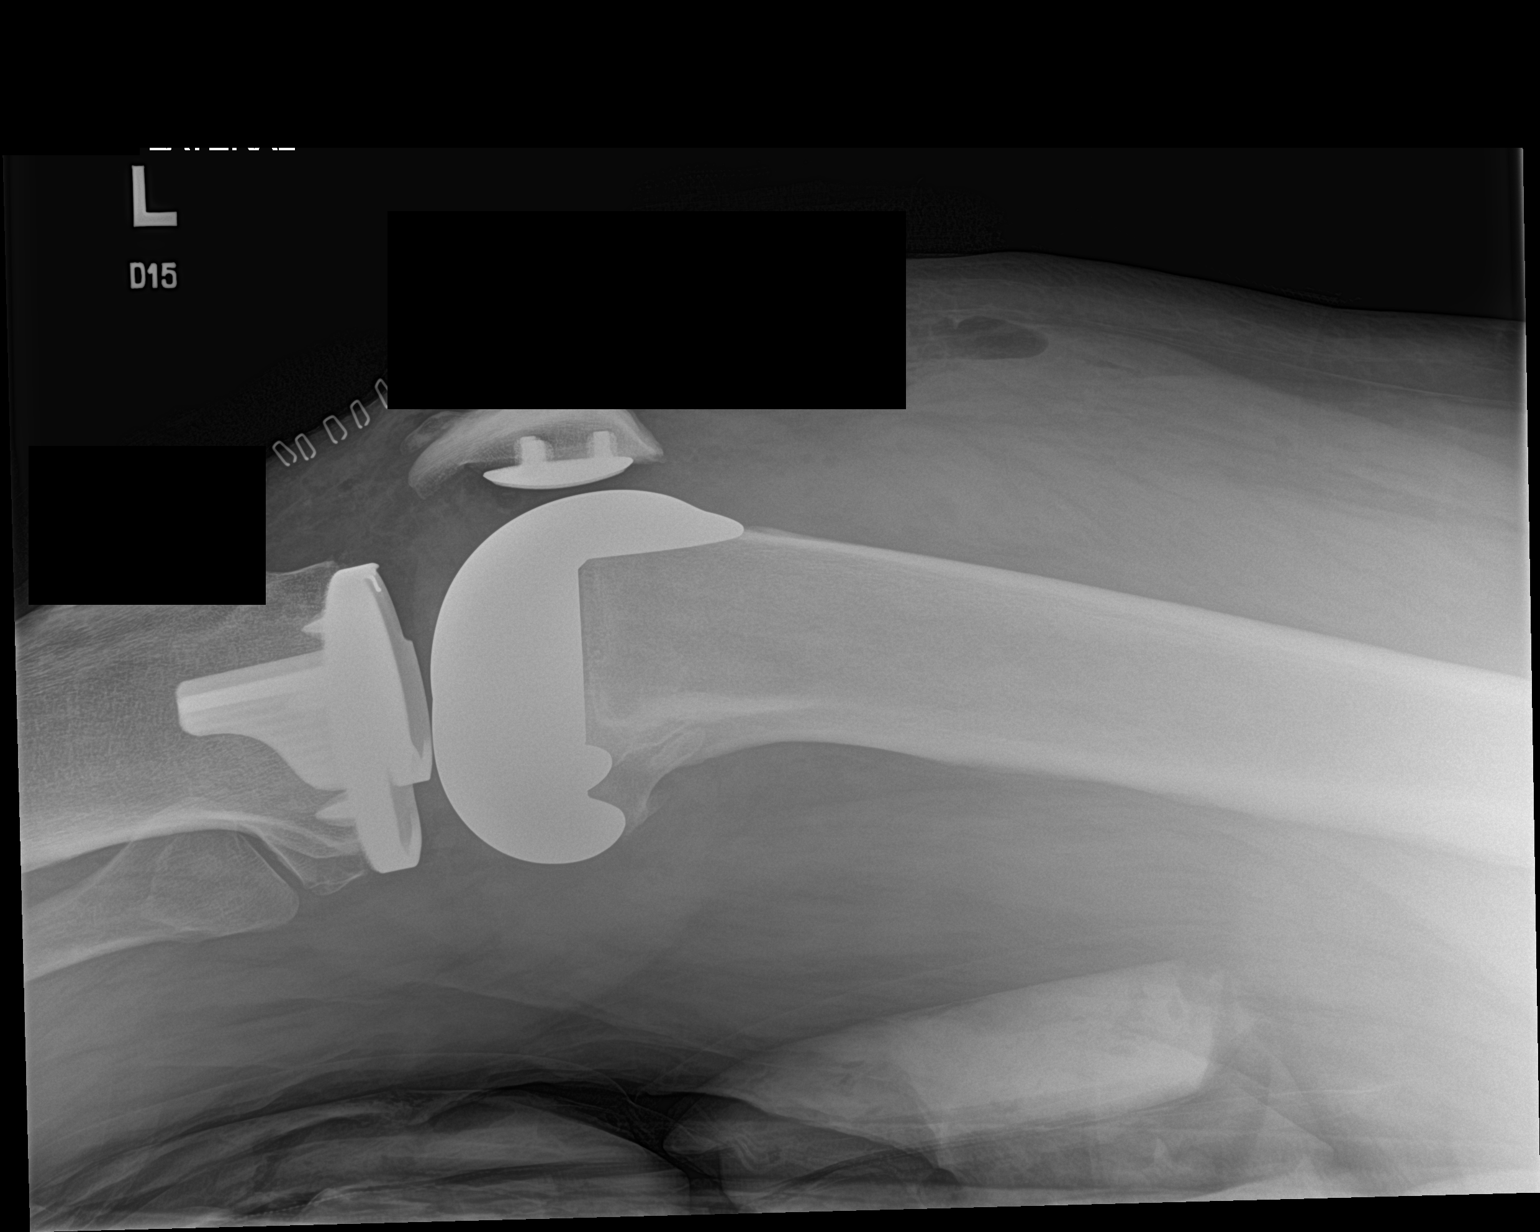

[2 of 2 positions shown; findings below may reference images not displayed]

FINDINGS: Total left knee replacement. Hardware intact. Anatomic alignment. No
acute bony abnormality identified.
IMPRESSION: Total left knee replacement with anatomic alignment.

## 2021-06-28 ENCOUNTER — Ambulatory Visit: Payer: BC Managed Care – PPO | Admitting: Nurse Practitioner

## 2021-06-28 ENCOUNTER — Encounter: Payer: Self-pay | Admitting: Nurse Practitioner

## 2021-06-28 ENCOUNTER — Other Ambulatory Visit: Payer: Self-pay

## 2021-06-28 VITALS — BP 161/74 | HR 73 | Temp 98.5°F | Resp 16 | Ht 75.0 in | Wt 288.1 lb

## 2021-06-28 DIAGNOSIS — F419 Anxiety disorder, unspecified: Secondary | ICD-10-CM | POA: Insufficient documentation

## 2021-06-28 DIAGNOSIS — Z0001 Encounter for general adult medical examination with abnormal findings: Secondary | ICD-10-CM | POA: Diagnosis not present

## 2021-06-28 DIAGNOSIS — Z7689 Persons encountering health services in other specified circumstances: Secondary | ICD-10-CM

## 2021-06-28 DIAGNOSIS — Z139 Encounter for screening, unspecified: Secondary | ICD-10-CM

## 2021-06-28 DIAGNOSIS — F410 Panic disorder [episodic paroxysmal anxiety] without agoraphobia: Secondary | ICD-10-CM | POA: Insufficient documentation

## 2021-06-28 DIAGNOSIS — K219 Gastro-esophageal reflux disease without esophagitis: Secondary | ICD-10-CM | POA: Diagnosis not present

## 2021-06-28 DIAGNOSIS — I10 Essential (primary) hypertension: Secondary | ICD-10-CM

## 2021-06-28 DIAGNOSIS — Z1211 Encounter for screening for malignant neoplasm of colon: Secondary | ICD-10-CM

## 2021-06-28 MED ORDER — PANTOPRAZOLE SODIUM 40 MG PO TBEC: 40.0000 mg | DELAYED_RELEASE_TABLET | Freq: Two times a day (BID) | ORAL | 1 refills | Status: DC

## 2021-06-28 MED ORDER — ALPRAZOLAM 0.5 MG PO TABS: 0.5000 mg | ORAL_TABLET | Freq: Two times a day (BID) | ORAL | 0 refills | Status: DC | PRN

## 2021-06-28 NOTE — Patient Instructions (Signed)
Please have fasting labs drawn 2-3 days prior to your appointment so we can discuss the results during your office visit.  

## 2021-06-28 NOTE — Assessment & Plan Note (Signed)
BP Readings from Last 3 Encounters:  06/28/21 (!) 161/74  03/17/20 102/65  03/09/20 (!) 152/90   -elevated BP may be related to EtOH, or anxiety -may consider future propranolol if this is elevated at next OV

## 2021-06-28 NOTE — Assessment & Plan Note (Signed)
-  refilled protonix

## 2021-06-28 NOTE — Progress Notes (Signed)
New Patient Office Visit  Subjective:  Patient ID: Victor Conner, male    DOB: 11/21/1965  Age: 55 y.o. MRN: [id number]  CC:  Chief Complaint  Patient presents with   Establish Care    Has been having panic attacks occasionally     HPI [name] presents for new patient visit.  Transferring care from [facility]. Last physical was over a year ago. Last labs were drawn at that time.  He is followed by orthopedics for left knee replacement (03/16/20).  He was having tingling in his fingers several years ago. He had MRI completed, and got in with Dr. [name]. He had cervical fusion. He still has some hand tingling.  He was taking protonix for GERD, and that has been helping. He would like a refill.  He is self-employed and has job-related stress. He feels like his heart is pounding out of his chest at times. He gets hot sweats. These last about 20 minutes. He is concerned with big jobs coming up and he doesn't have much help at work to get them done.  Past Medical History:  Diagnosis Date   Anxiety    Arthritis    left knee   GERD (gastroesophageal reflux disease)    Hypercholesteremia    Hypertension     Past Surgical History:  Procedure Laterality Date   ANTERIOR CERVICAL DECOMP/DISCECTOMY FUSION N/A 05/09/2019   Procedure: C4-5, C5-6 ANTERIOR CERVICAL DECOMPRESSION/DISCECTOMY FUSION, ALLOGRAFT, PLATE;  Surgeon: Marybelle Killings, MD;  Location: [facility];  Service: Orthopedics;  Laterality: N/A;   BONE CYST EXCISION     left leg   INCISION AND DRAINAGE Right    hand   TOTAL KNEE ARTHROPLASTY Left 03/16/2020   Procedure: LEFT TOTAL KNEE ARTHROPLASTY;  Surgeon: Mcarthur Rossetti, MD;  Location: [facility];  Service: Orthopedics;  Laterality: Left;    Family History  Problem Relation Age of Onset   Cancer Father    Cancer Sister    Heart disease Paternal Grandmother     Social History   Socioeconomic History   Marital status: Married    Spouse name: Not on file    Number of children: 3   Years of education: Not on file   Highest education level: Not on file  Occupational History   Occupation: Self-employed- [employer]  Tobacco Use   Smoking status: Never   Smokeless tobacco: Never  Vaping Use   Vaping Use: Never used  Substance and Sexual Activity   Alcohol use: Yes    Comment: 18 beers per weekend   Drug use: Never   Sexual activity: Yes  Other Topics Concern   Not on file  Social History Narrative   Not on file   Social Determinants of Health   Financial Resource Strain: Not on file  Food Insecurity: Not on file  Transportation Needs: Not on file  Physical Activity: Not on file  Stress: Not on file  Social Connections: Not on file  Intimate Partner Violence: Not on file    ROS Review of Systems  Constitutional: Negative.   Respiratory: Negative.    Cardiovascular: Negative.   Psychiatric/Behavioral:  Negative for self-injury and suicidal ideas. The patient is nervous/anxious.        Drinks heavily on the weekends and has a lot of work-related stress   Objective:   Today's Vitals: BP (!) 161/74   Pulse 73   Temp 98.5 F (36.9 C) (Oral)   Resp 16   Ht 6' 3"  (  1.905 m)   Wt 288 lb 1.9 oz (130.7 kg)   SpO2 97%   BMI 36.01 kg/m   Physical Exam Constitutional:      Appearance: Normal appearance. He is obese.  Cardiovascular:     Rate and Rhythm: Normal rate and regular rhythm.     Pulses: Normal pulses.     Heart sounds: Normal heart sounds.  Pulmonary:     Effort: Pulmonary effort is normal.     Breath sounds: Normal breath sounds.  Musculoskeletal:        General: Deformity present.     Comments: Left knee; had TKR  Neurological:     Mental Status: He is alert.  Psychiatric:        Mood and Affect: Mood normal.        Behavior: Behavior normal.        Thought Content: Thought content normal.        Judgment: Judgment normal.     Comments: Mildly anxious affect    Assessment & Plan:   Problem List Items  Addressed This Visit       Cardiovascular and Mediastinum   HTN (hypertension)    BP Readings from Last 3 Encounters:  06/28/21 (!) 161/74  03/17/20 102/65  03/09/20 (!) 152/90  -elevated BP may be related to EtOH, or anxiety -may consider future propranolol if this is elevated at next OV        Digestive   Esophageal reflux    -refilled protonix      Relevant Medications   pantoprazole (PROTONIX) 40 MG tablet     Other   Encounter to establish care - Primary    -obtain records      Relevant Orders   CBC with Differential/Platelet   Lipid Panel With LDL/HDL Ratio   CMP14+EGFR   TSH   Hepatitis C antibody   HIV Antibody (routine testing w rflx)   Anxiety    -Rx. Xanax; short course for upcoming flights -decrease weekend alcohol consumption -discussed box breathing for biofeedback      Relevant Medications   ALPRAZolam (XANAX) 0.5 MG tablet   Other Visit Diagnoses     Colon cancer screening       Relevant Orders   Cologuard   Encounter for general adult medical examination with abnormal findings       Relevant Orders   CBC with Differential/Platelet   Lipid Panel With LDL/HDL Ratio   CMP14+EGFR   TSH   Screening due       Relevant Orders   Hepatitis C antibody   HIV Antibody (routine testing w rflx)       Outpatient Encounter Medications as of 06/28/2021  Medication Sig   ALPRAZolam (XANAX) 0.5 MG tablet Take 1 tablet (0.5 mg total) by mouth 2 (two) times daily as needed for anxiety.   amLODipine (NORVASC) 10 MG tablet Take 5 mg by mouth daily.   Cholecalciferol (VITAMIN D-3) 125 MCG (5000 UT) TABS Take 5,000 Units by mouth daily.   Multiple Vitamins-Minerals (MULTIVITAMIN WITH MINERALS) tablet Take 1 tablet by mouth 2 (two) times a week.    pantoprazole (PROTONIX) 40 MG tablet Take 1 tablet (40 mg total) by mouth 2 (two) times daily.   simvastatin (ZOCOR) 40 MG tablet Take 40 mg by mouth at bedtime.    [DISCONTINUED] lisinopril (ZESTRIL) 20 MG  tablet Take 20 mg by mouth daily.    [DISCONTINUED] aspirin 81 MG chewable tablet Chew 1 tablet (81 mg total) by  mouth 2 (two) times daily.   [DISCONTINUED] esomeprazole (NEXIUM) 20 MG capsule Take 40 mg by mouth daily as needed (acid reflux/indigestion.).   [DISCONTINUED] HYDROcodone-acetaminophen (NORCO/VICODIN) 5-325 MG tablet Take 1 tablet by mouth every 6 (six) hours as needed for moderate pain.   [DISCONTINUED] ibuprofen (ADVIL) 800 MG tablet TAKE ONE TABLET BY MOUTH EVERY 8 HOURS AS NEEDED   [DISCONTINUED] methocarbamol (ROBAXIN) 500 MG tablet TAKE 1 TABLET BY MOUTH EVERY 6 HOURS AS NEEDED FOR MUSCLE SPASMS   [DISCONTINUED] tiZANidine (ZANAFLEX) 4 MG tablet Take 1 tablet (4 mg total) by mouth every 8 (eight) hours as needed for muscle spasms.   No facility-administered encounter medications on file as of 06/28/2021.    Follow-up: Return in about 6 weeks (around 08/09/2021) for Physical Exam.   Noreene Larsson, NP

## 2021-06-28 NOTE — Assessment & Plan Note (Signed)
-  Rx. Xanax; short course for upcoming flights -decrease weekend alcohol consumption -discussed box breathing for biofeedback

## 2021-06-28 NOTE — Assessment & Plan Note (Signed)
-  obtain records 

## 2021-07-01 DIAGNOSIS — Z7689 Persons encountering health services in other specified circumstances: Secondary | ICD-10-CM | POA: Diagnosis not present

## 2021-07-01 DIAGNOSIS — Z0001 Encounter for general adult medical examination with abnormal findings: Secondary | ICD-10-CM | POA: Diagnosis not present

## 2021-07-01 DIAGNOSIS — Z139 Encounter for screening, unspecified: Secondary | ICD-10-CM | POA: Diagnosis not present

## 2021-07-02 LAB — CBC WITH DIFFERENTIAL/PLATELET
Basophils Absolute: 0.1 10*3/uL (ref 0.0–0.2)
Basos: 2 %
EOS (ABSOLUTE): 0.2 10*3/uL (ref 0.0–0.4)
Eos: 3 %
Hematocrit: 45 % (ref 37.5–51.0)
Hemoglobin: 15.2 g/dL (ref 13.0–17.7)
Immature Grans (Abs): 0 10*3/uL (ref 0.0–0.1)
Immature Granulocytes: 1 %
Lymphocytes Absolute: 2.4 10*3/uL (ref 0.7–3.1)
Lymphs: 36 %
MCH: 30.5 pg (ref 26.6–33.0)
MCHC: 33.8 g/dL (ref 31.5–35.7)
MCV: 90 fL (ref 79–97)
Monocytes Absolute: 0.5 10*3/uL (ref 0.1–0.9)
Monocytes: 8 %
Neutrophils Absolute: 3.4 10*3/uL (ref 1.4–7.0)
Neutrophils: 50 %
Platelets: 205 10*3/uL (ref 150–450)
RBC: 4.99 x10E6/uL (ref 4.14–5.80)
RDW: 12.5 % (ref 11.6–15.4)
WBC: 6.6 10*3/uL (ref 3.4–10.8)

## 2021-07-02 LAB — LIPID PANEL WITH LDL/HDL RATIO
Cholesterol, Total: 189 mg/dL (ref 100–199)
HDL: 45 mg/dL (ref >39)
LDL Chol Calc (NIH): 99 mg/dL (ref 0–99)
LDL/HDL Ratio: 2.2 ratio (ref 0.0–3.6)
Triglycerides: 265 mg/dL — ABNORMAL HIGH (ref 0–149)
VLDL Cholesterol Cal: 45 mg/dL — ABNORMAL HIGH (ref 5–40)

## 2021-07-02 LAB — CMP14+EGFR
ALT: 23 IU/L (ref 0–44)
AST: 24 IU/L (ref 0–40)
Albumin/Globulin Ratio: 1.8 (ref 1.2–2.2)
Albumin: 4.6 g/dL (ref 3.8–4.9)
Alkaline Phosphatase: 68 IU/L (ref 44–121)
BUN/Creatinine Ratio: 10 (ref 9–20)
BUN: 14 mg/dL (ref 6–24)
Bilirubin Total: <0.2 mg/dL (ref 0.0–1.2)
CO2: 21 mmol/L (ref 20–29)
Calcium: 9.5 mg/dL (ref 8.7–10.2)
Chloride: 105 mmol/L (ref 96–106)
Creatinine, Ser: 1.47 mg/dL — ABNORMAL HIGH (ref 0.76–1.27)
Globulin, Total: 2.5 g/dL (ref 1.5–4.5)
Glucose: 96 mg/dL (ref 70–99)
Potassium: 4.9 mmol/L (ref 3.5–5.2)
Sodium: 144 mmol/L (ref 134–144)
Total Protein: 7.1 g/dL (ref 6.0–8.5)
eGFR: 56 mL/min/{1.73_m2} — ABNORMAL LOW (ref >59)

## 2021-07-02 LAB — HIV ANTIBODY (ROUTINE TESTING W REFLEX): HIV Screen 4th Generation wRfx: NONREACTIVE

## 2021-07-02 LAB — TSH: TSH: 1.01 u[IU]/mL (ref 0.450–4.500)

## 2021-07-02 LAB — HEPATITIS C ANTIBODY: Hep C Virus Ab: <0.1 s/co ratio (ref 0.0–0.9)

## 2021-07-12 ENCOUNTER — Telehealth: Payer: Self-pay | Admitting: *Deleted

## 2021-07-12 NOTE — Telephone Encounter (Signed)
Pt is wondering about labs has not heard any results please advise

## 2021-07-12 NOTE — Telephone Encounter (Signed)
Patient notified of lab results with verbal understanding

## 2021-08-01 ENCOUNTER — Ambulatory Visit: Payer: BC Managed Care – PPO | Admitting: Internal Medicine

## 2021-08-01 ENCOUNTER — Encounter: Payer: Self-pay | Admitting: Internal Medicine

## 2021-08-01 ENCOUNTER — Other Ambulatory Visit: Payer: Self-pay

## 2021-08-01 DIAGNOSIS — J0110 Acute frontal sinusitis, unspecified: Secondary | ICD-10-CM

## 2021-08-01 MED ORDER — SALINE SPRAY 0.65 % NA SOLN: 1.0000 | NASAL | 0 refills | Status: DC | PRN

## 2021-08-01 MED ORDER — AMOXICILLIN-POT CLAVULANATE 875-125 MG PO TABS: 1.0000 | ORAL_TABLET | Freq: Two times a day (BID) | ORAL | 0 refills | Status: DC

## 2021-08-01 NOTE — Progress Notes (Signed)
Virtual Visit via Telephone Note   This visit type was conducted due to national recommendations for restrictions regarding the COVID-19 Pandemic (e.g. social distancing) in an effort to limit this patient's exposure and mitigate transmission in our community.  Due to his co-morbid illnesses, this patient is at least at moderate risk for complications without adequate follow up.  This format is felt to be most appropriate for this patient at this time.  The patient did not have access to video technology/had technical difficulties with video requiring transitioning to audio format only (telephone).  All issues noted in this document were discussed and addressed.  No physical exam could be performed with this format.  Evaluation Performed:  Follow-up visit  Date:  08/01/2021   ID:  Victor Conner, Victor Conner 06-24-1966, MRN [id number]  Patient Location: Home Provider Location: Office/Clinic  Participants: Patient Location of Patient: Home Location of Provider: Telehealth Consent was obtain for visit to be over via telehealth. I verified that I am speaking with the correct person using two identifiers.  PCP:  Noreene Larsson, NP   Chief Complaint: Nasal congestion and sinus pressure related headache  History of Present Illness:    Victor Conner is a 55 y.o. male who has a televisit for complaint of nasal congestion, sinus pressure related headache and fatigue for last 4 to 5 days.  He denies any fever, chills, dyspnea or wheezing.  He had a negative COVID test at home.  Denies any recent sick contacts.  The patient does not have symptoms concerning for COVID-19 infection (fever, chills, cough, or new shortness of breath).   Past Medical, Surgical, Social History, Allergies, and Medications have been Reviewed.  Past Medical History:  Diagnosis Date   Anxiety    Arthritis    left knee   GERD (gastroesophageal reflux disease)    Hypercholesteremia    Hypertension    Past Surgical  History:  Procedure Laterality Date   ANTERIOR CERVICAL DECOMP/DISCECTOMY FUSION N/A 05/09/2019   Procedure: C4-5, C5-6 ANTERIOR CERVICAL DECOMPRESSION/DISCECTOMY FUSION, ALLOGRAFT, PLATE;  Surgeon: Marybelle Killings, MD;  Location: [place] [facility];  Service: Orthopedics;  Laterality: N/A;   BONE CYST EXCISION     left leg   INCISION AND DRAINAGE Right    hand   TOTAL KNEE ARTHROPLASTY Left 03/16/2020   Procedure: LEFT TOTAL KNEE ARTHROPLASTY;  Surgeon: Mcarthur Rossetti, MD;  Location: [facility];  Service: Orthopedics;  Laterality: Left;     Current Meds  Medication Sig   ALPRAZolam (XANAX) 0.5 MG tablet Take 1 tablet (0.5 mg total) by mouth 2 (two) times daily as needed for anxiety.   amLODipine (NORVASC) 10 MG tablet Take 5 mg by mouth daily.   Cholecalciferol (VITAMIN D-3) 125 MCG (5000 UT) TABS Take 5,000 Units by mouth daily.   Multiple Vitamins-Minerals (MULTIVITAMIN WITH MINERALS) tablet Take 1 tablet by mouth 2 (two) times a week.    pantoprazole (PROTONIX) 40 MG tablet Take 1 tablet (40 mg total) by mouth 2 (two) times daily.   simvastatin (ZOCOR) 40 MG tablet Take 40 mg by mouth at bedtime.      Allergies:   Morphine and related and Shrimp [shellfish allergy]   ROS:   Please see the history of present illness.     All other systems reviewed and are negative.   Labs/Other Tests and Data Reviewed:    Recent Labs: 07/01/2021: ALT 23; BUN 14; Creatinine, Ser 1.47; Hemoglobin 15.2; Platelets 205; Potassium 4.9; Sodium  144; TSH 1.010   Recent Lipid Panel Lab Results  Component Value Date/Time   CHOL 189 07/01/2021 08:46 AM   TRIG 265 (H) 07/01/2021 08:46 AM   HDL 45 07/01/2021 08:46 AM   LDLCALC 99 07/01/2021 08:46 AM    Wt Readings from Last 3 Encounters:  06/28/21 288 lb 1.9 oz (130.7 kg)  03/16/20 287 lb 14.7 oz (130.6 kg)  03/09/20 288 lb (130.6 kg)    ASSESSMENT & PLAN:    Acute sinusitis Persistent symptoms despite symptomatic treatment with Sudafed and  Alka-Seltzer Negative COVID test at home Started Augmentin Nasal saline spray as needed for nasal congestion.  Time:   Today, I have spent 9 minutes reviewing the chart, including problem list, medications, and with the patient with telehealth technology discussing the above problems.   Medication Adjustments/Labs and Tests Ordered: Current medicines are reviewed at length with the patient today.  Concerns regarding medicines are outlined above.   Tests Ordered: No orders of the defined types were placed in this encounter.   Medication Changes: No orders of the defined types were placed in this encounter.    Note: This dictation was prepared with Dragon dictation along with smaller phrase technology. Similar sounding words can be transcribed inadequately or may not be corrected upon review. Any transcriptional errors that result from this process are unintentional.      Disposition:  Follow up  Signed, Lindell Spar, MD  08/01/2021 10:55 AM     [place]

## 2021-08-12 ENCOUNTER — Other Ambulatory Visit: Payer: Self-pay | Admitting: Nurse Practitioner

## 2021-08-12 DIAGNOSIS — K219 Gastro-esophageal reflux disease without esophagitis: Secondary | ICD-10-CM

## 2021-09-06 ENCOUNTER — Other Ambulatory Visit: Payer: Self-pay

## 2021-09-06 ENCOUNTER — Ambulatory Visit (INDEPENDENT_AMBULATORY_CARE_PROVIDER_SITE_OTHER): Payer: BC Managed Care – PPO | Admitting: Nurse Practitioner

## 2021-09-06 ENCOUNTER — Encounter: Payer: Self-pay | Admitting: Nurse Practitioner

## 2021-09-06 VITALS — BP 169/96 | HR 86 | Ht 75.0 in | Wt 292.0 lb

## 2021-09-06 DIAGNOSIS — Z0001 Encounter for general adult medical examination with abnormal findings: Secondary | ICD-10-CM | POA: Diagnosis not present

## 2021-09-06 DIAGNOSIS — D179 Benign lipomatous neoplasm, unspecified: Secondary | ICD-10-CM | POA: Insufficient documentation

## 2021-09-06 DIAGNOSIS — D171 Benign lipomatous neoplasm of skin and subcutaneous tissue of trunk: Secondary | ICD-10-CM | POA: Diagnosis not present

## 2021-09-06 DIAGNOSIS — N1831 Chronic kidney disease, stage 3a: Secondary | ICD-10-CM | POA: Diagnosis not present

## 2021-09-06 DIAGNOSIS — I10 Essential (primary) hypertension: Secondary | ICD-10-CM | POA: Diagnosis not present

## 2021-09-06 DIAGNOSIS — E785 Hyperlipidemia, unspecified: Secondary | ICD-10-CM | POA: Insufficient documentation

## 2021-09-06 DIAGNOSIS — Z23 Encounter for immunization: Secondary | ICD-10-CM

## 2021-09-06 DIAGNOSIS — N183 Chronic kidney disease, stage 3 unspecified: Secondary | ICD-10-CM | POA: Insufficient documentation

## 2021-09-06 DIAGNOSIS — E782 Mixed hyperlipidemia: Secondary | ICD-10-CM

## 2021-09-06 NOTE — Progress Notes (Signed)
Acute Office Visit  Subjective:    Patient ID: Victor Conner, male    DOB: 1966-03-04, 55 y.o.   MRN: [id number]  Chief Complaint  Patient presents with   Annual Exam    HPI Patient is in today for physical exam.  He states he has been out of lisinopril for about a month.  No acute concerns.   Past Medical History:  Diagnosis Date   Anxiety    Arthritis    left knee   GERD (gastroesophageal reflux disease)    Hypercholesteremia    Hypertension     Past Surgical History:  Procedure Laterality Date   ANTERIOR CERVICAL DECOMP/DISCECTOMY FUSION N/A 05/09/2019   Procedure: C4-5, C5-6 ANTERIOR CERVICAL DECOMPRESSION/DISCECTOMY FUSION, ALLOGRAFT, PLATE;  Surgeon: Marybelle Killings, MD;  Location: [place];  Service: Orthopedics;  Laterality: N/A;   BONE CYST EXCISION     left leg   INCISION AND DRAINAGE Right    hand   TOTAL KNEE ARTHROPLASTY Left 03/16/2020   Procedure: LEFT TOTAL KNEE ARTHROPLASTY;  Surgeon: Mcarthur Rossetti, MD;  Location: [facility];  Service: Orthopedics;  Laterality: Left;    Family History  Problem Relation Age of Onset   Cancer Father    Cancer Sister    Heart disease Paternal Grandmother     Social History   Socioeconomic History   Marital status: Married    Spouse name: Not on file   Number of children: 3   Years of education: Not on file   Highest education level: Not on file  Occupational History   Occupation: Self-employed- HV[employer]  Tobacco Use   Smoking status: Never   Smokeless tobacco: Never  Vaping Use   Vaping Use: Never used  Substance and Sexual Activity   Alcohol use: Yes    Comment: 18 beers per weekend   Drug use: Never   Sexual activity: Yes  Other Topics Concern   Not on file  Social History Narrative   Not on file   Social Determinants of Health   Financial Resource Strain: Not on file  Food Insecurity: Not on file  Transportation Needs: Not on file  Physical Activity: Not on file  Stress: Not on file   Social Connections: Not on file  Intimate Partner Violence: Not on file    Outpatient Medications Prior to Visit  Medication Sig Dispense Refill   ALPRAZolam (XANAX) 0.5 MG tablet Take 1 tablet (0.5 mg total) by mouth 2 (two) times daily as needed for anxiety. 10 tablet 0   amLODipine (NORVASC) 10 MG tablet Take 5 mg by mouth daily.     amoxicillin-clavulanate (AUGMENTIN) 875-125 MG tablet Take 1 tablet by mouth 2 (two) times daily. 14 tablet 0   Cholecalciferol (VITAMIN D-3) 125 MCG (5000 UT) TABS Take 5,000 Units by mouth daily.     Multiple Vitamins-Minerals (MULTIVITAMIN WITH MINERALS) tablet Take 1 tablet by mouth 2 (two) times a week.      pantoprazole (PROTONIX) 40 MG tablet TAKE (1) TABLET BY MOUTH TWICE DAILY. 60 tablet 0   simvastatin (ZOCOR) 40 MG tablet Take 40 mg by mouth at bedtime.      sodium chloride (OCEAN) 0.65 % SOLN nasal spray Place 1 spray into both nostrils as needed for congestion. 15 mL 0   No facility-administered medications prior to visit.    Allergies  Allergen Reactions   Morphine And Related Itching   Shrimp [Shellfish Allergy] Swelling    Eyes/Throat swelling    Review  of Systems  Constitutional: Negative.   HENT: Negative.    Eyes: Negative.   Respiratory: Negative.    Cardiovascular: Negative.   Gastrointestinal: Negative.   Endocrine: Negative.   Genitourinary: Negative.   Musculoskeletal: Negative.   Skin: Negative.   Allergic/Immunologic: Negative.   Neurological: Negative.   Hematological: Negative.   Psychiatric/Behavioral: Negative.        Objective:    Physical Exam Constitutional:      Appearance: Normal appearance.  HENT:     Head: Normocephalic and atraumatic.     Right Ear: Tympanic membrane, ear canal and external ear normal.     Left Ear: Tympanic membrane, ear canal and external ear normal.     Nose: Nose normal.     Mouth/Throat:     Mouth: Mucous membranes are moist.     Pharynx: Oropharynx is clear.  Eyes:      Extraocular Movements: Extraocular movements intact.     Conjunctiva/sclera: Conjunctivae normal.     Pupils: Pupils are equal, round, and reactive to light.  Cardiovascular:     Rate and Rhythm: Normal rate and regular rhythm.     Pulses: Normal pulses.     Heart sounds: Normal heart sounds.  Pulmonary:     Effort: Pulmonary effort is normal.     Breath sounds: Normal breath sounds.  Abdominal:     General: Abdomen is flat. Bowel sounds are normal.     Palpations: Abdomen is soft.  Musculoskeletal:        General: Normal range of motion.     Cervical back: Normal range of motion and neck supple.  Skin:    General: Skin is warm and dry.     Capillary Refill: Capillary refill takes less than 2 seconds.     Comments: Lipoma to right upper back  Neurological:     General: No focal deficit present.     Mental Status: He is alert and oriented to person, place, and time.     Cranial Nerves: No cranial nerve deficit.     Sensory: No sensory deficit.     Motor: No weakness.     Coordination: Coordination normal.     Gait: Gait normal.  Psychiatric:        Mood and Affect: Mood normal.        Behavior: Behavior normal.        Thought Content: Thought content normal.        Judgment: Judgment normal.    BP (!) 169/96 (BP Location: Right Arm, Patient Position: Sitting, Cuff Size: Normal)    Pulse 86    Ht 6' 3"  (1.905 m)    Wt 292 lb (132.5 kg)    SpO2 93%    BMI 36.50 kg/m  Wt Readings from Last 3 Encounters:  09/06/21 292 lb (132.5 kg)  06/28/21 288 lb 1.9 oz (130.7 kg)  03/16/20 287 lb 14.7 oz (130.6 kg)    Health Maintenance Due  Topic Date Due   COLONOSCOPY (Pts 45-75yr Insurance coverage will need to be confirmed)  Never done    There are no preventive care reminders to display for this patient.   Lab Results  Component Value Date   TSH 1.010 07/01/2021   Lab Results  Component Value Date   WBC 6.6 07/01/2021   HGB 15.2 07/01/2021   HCT 45.0 07/01/2021   MCV  90 07/01/2021   PLT 205 07/01/2021   Lab Results  Component Value Date   NA 144  07/01/2021   K 4.9 07/01/2021   CO2 21 07/01/2021   GLUCOSE 96 07/01/2021   BUN 14 07/01/2021   CREATININE 1.47 (H) 07/01/2021   BILITOT <0.2 07/01/2021   ALKPHOS 68 07/01/2021   AST 24 07/01/2021   ALT 23 07/01/2021   PROT 7.1 07/01/2021   ALBUMIN 4.6 07/01/2021   CALCIUM 9.5 07/01/2021   ANIONGAP 9 03/09/2020   EGFR 56 (L) 07/01/2021   Lab Results  Component Value Date   CHOL 189 07/01/2021   Lab Results  Component Value Date   HDL 45 07/01/2021   Lab Results  Component Value Date   LDLCALC 99 07/01/2021   Lab Results  Component Value Date   TRIG 265 (H) 07/01/2021   No results found for: CHOLHDL No results found for: HGBA1C     Assessment & Plan:   Problem List Items Addressed This Visit       Cardiovascular and Mediastinum   HTN (hypertension)    BP Readings from Last 3 Encounters:  09/06/21 (!) 169/96  06/28/21 (!) 161/74  03/17/20 102/65  -BP elevated -he states he was on lisinopril 20 mg with his previous PCP, but he ran out about a month ago -Rx. lisinopril      Relevant Orders   CBC with Differential/Platelet   CMP14+EGFR   Lipid Panel With LDL/HDL Ratio     Genitourinary   CKD (chronic kidney disease) stage 3, GFR 30-59 ml/min (HCC)    -no previous labs for comparison; could be AKI d/t dehydration from his job -we discussed increasing hydration and maintaining good control of his BP to prevent further decline -repeat labs in 3 months -BMP today to check renal function      Relevant Orders   Basic metabolic panel   [id number]     Other   Encounter for general adult medical examination with abnormal findings - Primary    -exam completed -TDaP today      Lipoma    -to right upper back -he states it has been there for years and hasn't changed size -if he has any size changes or pain, return to the office      HLD (hyperlipidemia)    Lab Results   Component Value Date   CHOL 189 07/01/2021   HDL 45 07/01/2021   [place] 99 07/01/2021   TRIG 265 (H) 07/01/2021  -continue simvastatin -discussed taking fish oil OTC 3 g per day      Relevant Orders   Lipid Panel With LDL/HDL Ratio   Other Visit Diagnoses     Need for Tdap vaccination       Relevant Orders   Tdap vaccine greater than or equal to 7yo IM (Completed)        No orders of the defined types were placed in this encounter.    Noreene Larsson, NP

## 2021-09-06 NOTE — Assessment & Plan Note (Signed)
-  exam completed -TDaP today

## 2021-09-06 NOTE — Assessment & Plan Note (Signed)
-  to right upper back -he states it has been there for years and hasn't changed size -if he has any size changes or pain, return to the office

## 2021-09-06 NOTE — Patient Instructions (Addendum)
Please have fasting labs drawn 2-3 days prior to your appointment so we can discuss the results during your office visit.  I will be moving to [place] located at [address] [place], [place] [address] effective Sep 22, 2021. If you would like to establish care with [facility] please call [phone].

## 2021-09-06 NOTE — Assessment & Plan Note (Signed)
BP Readings from Last 3 Encounters:  09/06/21 (!) 169/96  06/28/21 (!) 161/74  03/17/20 102/65   -BP elevated -he states he was on lisinopril 20 mg with his previous PCP, but he ran out about a month ago -Rx. lisinopril

## 2021-09-06 NOTE — Assessment & Plan Note (Signed)
Lab Results  Component Value Date   CHOL 189 07/01/2021   HDL 45 07/01/2021   LDLCALC 99 07/01/2021   TRIG 265 (H) 07/01/2021   -continue simvastatin -discussed taking fish oil OTC 3 g per day

## 2021-09-06 NOTE — Assessment & Plan Note (Addendum)
-  no previous labs for comparison; could be AKI d/t dehydration from his job -we discussed increasing hydration and maintaining good control of his BP to prevent further decline -repeat labs in 3 months -BMP today to check renal function

## 2021-09-07 LAB — BASIC METABOLIC PANEL
BUN/Creatinine Ratio: 14 (ref 9–20)
BUN: 15 mg/dL (ref 6–24)
CO2: 22 mmol/L (ref 20–29)
Calcium: 9.6 mg/dL (ref 8.7–10.2)
Chloride: 103 mmol/L (ref 96–106)
Creatinine, Ser: 1.1 mg/dL (ref 0.76–1.27)
Glucose: 104 mg/dL — ABNORMAL HIGH (ref 70–99)
Potassium: 4.9 mmol/L (ref 3.5–5.2)
Sodium: 142 mmol/L (ref 134–144)
eGFR: 80 mL/min/{1.73_m2} (ref >59)

## 2021-09-09 NOTE — Progress Notes (Signed)
Glucose slightly elevated. Will add A1c to labs.

## 2021-09-10 ENCOUNTER — Other Ambulatory Visit: Payer: Self-pay | Admitting: *Deleted

## 2021-09-10 MED ORDER — AMLODIPINE BESYLATE 10 MG PO TABS: 5.0000 mg | ORAL_TABLET | Freq: Every day | ORAL | 1 refills | Status: DC

## 2021-09-11 ENCOUNTER — Other Ambulatory Visit: Payer: Self-pay | Admitting: Nurse Practitioner

## 2021-09-11 ENCOUNTER — Telehealth: Payer: Self-pay | Admitting: *Deleted

## 2021-09-11 DIAGNOSIS — I10 Essential (primary) hypertension: Secondary | ICD-10-CM

## 2021-09-11 MED ORDER — LISINOPRIL 20 MG PO TABS: 20.0000 mg | ORAL_TABLET | Freq: Every day | ORAL | 3 refills | Status: DC

## 2021-09-11 NOTE — Telephone Encounter (Signed)
Patient needs refill on Lisinopril 20mg . I did not see this on his med list. Ok to send into pharmacy?

## 2021-09-11 NOTE — Telephone Encounter (Signed)
Patient aware.

## 2021-09-11 NOTE — Telephone Encounter (Signed)
I sent it in just now. I thought I sent it in at his visit, but it must not have gone through.

## 2021-11-06 ENCOUNTER — Other Ambulatory Visit: Payer: Self-pay

## 2021-11-06 DIAGNOSIS — K219 Gastro-esophageal reflux disease without esophagitis: Secondary | ICD-10-CM

## 2021-11-06 MED ORDER — PANTOPRAZOLE SODIUM 40 MG PO TBEC: DELAYED_RELEASE_TABLET | ORAL | 5 refills | Status: DC

## 2021-12-17 MED ORDER — SIMVASTATIN 40 MG PO TABS: 40.0000 mg | ORAL_TABLET | Freq: Every day | ORAL | 4 refills | Status: DC

## 2021-12-20 ENCOUNTER — Encounter: Payer: Self-pay | Admitting: Internal Medicine

## 2021-12-20 ENCOUNTER — Ambulatory Visit: Payer: BC Managed Care – PPO | Admitting: Internal Medicine

## 2021-12-20 VITALS — BP 132/88 | HR 69 | Resp 18 | Ht 76.0 in | Wt 296.0 lb

## 2021-12-20 DIAGNOSIS — Z981 Arthrodesis status: Secondary | ICD-10-CM | POA: Diagnosis not present

## 2021-12-20 DIAGNOSIS — K219 Gastro-esophageal reflux disease without esophagitis: Secondary | ICD-10-CM

## 2021-12-20 DIAGNOSIS — R1030 Lower abdominal pain, unspecified: Secondary | ICD-10-CM | POA: Diagnosis not present

## 2021-12-20 DIAGNOSIS — I10 Essential (primary) hypertension: Secondary | ICD-10-CM | POA: Diagnosis not present

## 2021-12-20 DIAGNOSIS — F419 Anxiety disorder, unspecified: Secondary | ICD-10-CM

## 2021-12-20 DIAGNOSIS — F410 Panic disorder [episodic paroxysmal anxiety] without agoraphobia: Secondary | ICD-10-CM

## 2021-12-20 LAB — POCT URINALYSIS DIP (CLINITEK)
Bilirubin, UA: NEGATIVE
Blood, UA: NEGATIVE
Glucose, UA: NEGATIVE mg/dL
Ketones, POC UA: NEGATIVE mg/dL
Leukocytes, UA: NEGATIVE
Nitrite, UA: NEGATIVE
POC PROTEIN,UA: NEGATIVE
Spec Grav, UA: 1.015
Urobilinogen, UA: 0.2 U/dL
pH, UA: 7.5

## 2021-12-20 MED ORDER — ALPRAZOLAM 0.5 MG PO TABS: 0.5000 mg | ORAL_TABLET | Freq: Two times a day (BID) | ORAL | 0 refills | Status: DC | PRN

## 2021-12-20 NOTE — Assessment & Plan Note (Signed)
Has had episodes of panic in the past in crowd and especially while in plane ?Usually better with Xanax as needed, refilled ?

## 2021-12-20 NOTE — Assessment & Plan Note (Signed)
BP Readings from Last 1 Encounters:  ?12/20/21 132/88  ? ?Well-controlled with amlodipine and lisinopril ?Counseled for compliance with the medications ?Advised DASH diet and moderate exercise/walking, at least 150 mins/week ?

## 2021-12-20 NOTE — Assessment & Plan Note (Signed)
Well controlled with Protonix ?

## 2021-12-20 NOTE — Assessment & Plan Note (Signed)
Current neck pain less likely related to cervical stenosis ?Could be due to trapezius strain ?Conservative management with Tylenol and heating pad for now ?Needs to change stiff pillow to avoid neck stiffness ?

## 2021-12-20 NOTE — Progress Notes (Signed)
? ?Established Patient Office Visit ? ?Subjective:  ?Patient ID: Victor Conner, male    DOB: [birth year]  Age: 56 y.o. MRN: [id number] ? ?CC:  ?Chief Complaint  ?Patient presents with  ? Follow-up  ?  3 month follow up pt has pain in left side of neck has been going on for few weeks also has had pain in groin for about a week   ? ? ?HPI ?Victor Conner is a 56 y.o. male with past medical history of HTN, GERD, HLD, cervical spinal stenosis and panic disorder who presents for f/u of his chronic medical conditions. ? ?He complains of left-sided neck pain, which has been intermittent for the last few days.  He states that his neck pain improves with movement/stretching on the right side.  Of note, he has history of cervical spinal stenosis and has had cervical spinal fusion in the past.  He currently denies any numbness, tingling or weakness of the UE. ? ?He complains of scrotal area discomfort and spasms in the groin area recently.  Of note, he reports that he had to move about 150 pounds object with 1 other helping person recently to the attic, and attributes his discomfort to it.  He does report mild dysuria, but denies any hematuria, urinary hesitance or resistance.  He admits that he tends to hold his urine for prolonged times at times. ? ?HTN: BP is well-controlled. Takes medications regularly. Patient denies headache, dizziness, chest pain, dyspnea or palpitations. ? ? ? ? ?Past Medical History:  ?Diagnosis Date  ? Anxiety   ? Arthritis   ? left knee  ? GERD (gastroesophageal reflux disease)   ? Hypercholesteremia   ? Hypertension   ? ? ?Past Surgical History:  ?Procedure Laterality Date  ? ANTERIOR CERVICAL DECOMP/DISCECTOMY FUSION N/A 05/09/2019  ? Procedure: C4-5, C5-6 ANTERIOR CERVICAL DECOMPRESSION/DISCECTOMY FUSION, ALLOGRAFT, PLATE;  Surgeon: Marybelle Killings, MD;  Location: [facility];  Service: Orthopedics;  Laterality: N/A;  ? BONE CYST EXCISION    ? left leg  ? INCISION AND DRAINAGE Right   ? hand  ? TOTAL  KNEE ARTHROPLASTY Left 03/16/2020  ? Procedure: LEFT TOTAL KNEE ARTHROPLASTY;  Surgeon: Mcarthur Rossetti, MD;  Location: [facility];  Service: Orthopedics;  Laterality: Left;  ? ? ?Family History  ?Problem Relation Age of Onset  ? Cancer Father   ? Cancer Sister   ? Heart disease Paternal Grandmother   ? ? ?Social History  ? ?Socioeconomic History  ? Marital status: Married  ?  Spouse name: Not on file  ? Number of children: 3  ? Years of education: Not on file  ? Highest education level: Not on file  ?Occupational History  ? 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 Smoking status: Never  ? Smokeless tobacco: Never  ?Vaping Use  ? Vaping Use: Never used  ?Substance and Sexual Activity  ? Alcohol use: Yes  ?  Comment: 18 beers per weekend  ? Drug use: Never  ? Sexual activity: Yes  ?Other Topics Concern  ? Not on file  ?Social History Narrative  ? Not on file  ? ?Social Determinants of Health  ? ?Financial Resource Strain: Not on file  ?Food Insecurity: Not on file  ?Transportation Needs: Not on file  ?Physical Activity: Not on file  ?Stress: Not on file  ?Social Connections: Not on file  ?Intimate Partner Violence: Not on file  ? ? ?Outpatient Medications Prior to Visit  ?Medication Sig Dispense Refill  ?  amLODipine (NORVASC) 10 MG tablet Take 0.5 tablets (5 mg total) by mouth daily. 90 tablet 1  ? Cholecalciferol (VITAMIN D-3) 125 MCG (5000 UT) TABS Take 5,000 Units by mouth daily.    ? lisinopril (ZESTRIL) 20 MG tablet Take 1 tablet (20 mg total) by mouth daily. 90 tablet 3  ? Multiple Vitamins-Minerals (MULTIVITAMIN WITH MINERALS) tablet Take 1 tablet by mouth 2 (two) times a week.     ? pantoprazole (PROTONIX) 40 MG tablet TAKE (1) TABLET BY MOUTH TWICE DAILY. 60 tablet 5  ? simvastatin (ZOCOR) 40 MG tablet Take 1 tablet (40 mg total) by mouth at bedtime. 30 tablet 4  ? sodium chloride (OCEAN) 0.65 % SOLN nasal spray Place 1 spray into both nostrils as needed for congestion. 15 mL 0  ? ALPRAZolam  (XANAX) 0.5 MG tablet Take 1 tablet (0.5 mg total) by mouth 2 (two) times daily as needed for anxiety. 10 tablet 0  ? amoxicillin-clavulanate (AUGMENTIN) 875-125 MG tablet Take 1 tablet by mouth 2 (two) times daily. (Patient not taking: Reported on 12/20/2021) 14 tablet 0  ? ?No facility-administered medications prior to visit.  ? ? ?Allergies  ?Allergen Reactions  ? Morphine And Related Itching  ? Shrimp [Shellfish Allergy] Swelling  ?  Eyes/Throat swelling  ? ? ?ROS ?Review of Systems  ?Constitutional:  Negative for chills and fever.  ?HENT:  Negative for congestion and sore throat.   ?Eyes:  Negative for pain and discharge.  ?Respiratory:  Negative for cough and shortness of breath.   ?Cardiovascular:  Negative for chest pain and palpitations.  ?Gastrointestinal:  Negative for diarrhea, nausea and vomiting.  ?Endocrine: Negative for polydipsia and polyuria.  ?Genitourinary:  Negative for dysuria and hematuria.  ?     Groin pain  ?Musculoskeletal:  Positive for neck pain. Negative for arthralgias.  ?Skin:  Negative for rash.  ?Neurological:  Negative for dizziness, weakness, numbness and headaches.  ?Psychiatric/Behavioral:  Negative for agitation and behavioral problems.   ? ?  ?Objective:  ?  ?Physical Exam ?Vitals reviewed.  ?Constitutional:   ?   General: He is not in acute distress. ?   Appearance: He is obese. He is not diaphoretic.  ?HENT:  ?   Head: Normocephalic and atraumatic.  ?   Nose: Nose normal.  ?   Mouth/Throat:  ?   Mouth: Mucous membranes are moist.  ?Eyes:  ?   General: No scleral icterus. ?   Extraocular Movements: Extraocular movements intact.  ?Cardiovascular:  ?   Rate and Rhythm: Normal rate and regular rhythm.  ?   Heart sounds: No murmur heard. ?Pulmonary:  ?   Breath sounds: Normal breath sounds. No wheezing or rales.  ?Musculoskeletal:  ?   Cervical back: Neck supple. No tenderness.  ?   Right lower leg: No edema.  ?   Left lower leg: No edema.  ?Skin: ?   General: Skin is warm.  ?    Findings: No rash.  ?Neurological:  ?   General: No focal deficit present.  ?   Mental Status: He is alert and oriented to person, place, and time.  ?   Sensory: No sensory deficit.  ?   Motor: No weakness.  ?Psychiatric:     ?   Mood and Affect: Mood normal.     ?   Behavior: Behavior normal.  ? ? ?BP 132/88 (BP Location: Left Arm, Patient Position: Sitting, Cuff Size: Normal)   Pulse 69   Resp 18  Ht 6' 4"  (1.93 m)   Wt 296 lb (134.3 kg)   SpO2 96%   BMI 36.03 kg/m?  ?Wt Readings from Last 3 Encounters:  ?12/20/21 296 lb (134.3 kg)  ?09/06/21 292 lb (132.5 kg)  ?06/28/21 288 lb 1.9 oz (130.7 kg)  ? ? ?Lab Results  ?Component Value Date  ? TSH 1.010 07/01/2021  ? ?Lab Results  ?Component Value Date  ? WBC 6.6 07/01/2021  ? HGB 15.2 07/01/2021  ? HCT 45.0 07/01/2021  ? MCV 90 07/01/2021  ? PLT 205 07/01/2021  ? ?Lab Results  ?Component Value Date  ? NA 142 09/06/2021  ? K 4.9 09/06/2021  ? CO2 22 09/06/2021  ? GLUCOSE 104 (H) 09/06/2021  ? BUN 15 09/06/2021  ? CREATININE 1.10 09/06/2021  ? BILITOT <0.2 07/01/2021  ? ALKPHOS 68 07/01/2021  ? AST 24 07/01/2021  ? ALT 23 07/01/2021  ? PROT 7.1 07/01/2021  ? ALBUMIN 4.6 07/01/2021  ? CALCIUM 9.6 09/06/2021  ? ANIONGAP 9 03/09/2020  ? EGFR 80 09/06/2021  ? ?Lab Results  ?Component Value Date  ? CHOL 189 07/01/2021  ? ?Lab Results  ?Component Value Date  ? HDL 45 07/01/2021  ? ?Lab Results  ?Component Value Date  ? [place] 99 07/01/2021  ? ?Lab Results  ?Component Value Date  ? TRIG 265 (H) 07/01/2021  ? ?No results found for: CHOLHDL ?No results found for: HGBA1C ? ?  ?Assessment & Plan:  ? ?Problem List Items Addressed This Visit   ? ?  ? Cardiovascular and Mediastinum  ? HTN (hypertension) - Primary  ?  BP Readings from Last 1 Encounters:  ?12/20/21 132/88  ?Well-controlled with amlodipine and lisinopril ?Counseled for compliance with the medications ?Advised DASH diet and moderate exercise/walking, at least 150 mins/week ?  ?  ?  ? Digestive  ? GERD  (gastroesophageal reflux disease)  ?  Well controlled with Protonix ?  ?  ?  ? Other  ? S/P cervical spinal fusion  ?  Current neck pain less likely related to cervical stenosis ?Could be due to trapezius strain ?Conservative

## 2021-12-20 NOTE — Patient Instructions (Signed)
Please continue to take medications as prescribed.  Please continue to follow low salt diet and perform moderate exercise/walking at least 150 mins/week.  

## 2021-12-20 NOTE — Assessment & Plan Note (Addendum)
Groin area pain likely due to strain of the psoas muscle from heavy lifting ?Conservative management for now ?UA negative for LE and nitrites, advised to contact if he has any scrotal pain or swelling ?

## 2022-01-09 DIAGNOSIS — R072 Precordial pain: Secondary | ICD-10-CM | POA: Diagnosis not present

## 2022-01-09 DIAGNOSIS — I2510 Atherosclerotic heart disease of native coronary artery without angina pectoris: Secondary | ICD-10-CM | POA: Diagnosis not present

## 2022-01-14 ENCOUNTER — Encounter: Payer: Self-pay | Admitting: Internal Medicine

## 2022-01-14 ENCOUNTER — Ambulatory Visit: Payer: BC Managed Care – PPO | Admitting: Internal Medicine

## 2022-01-14 DIAGNOSIS — R3911 Hesitancy of micturition: Secondary | ICD-10-CM

## 2022-01-14 DIAGNOSIS — N401 Enlarged prostate with lower urinary tract symptoms: Secondary | ICD-10-CM

## 2022-01-14 MED ORDER — TAMSULOSIN HCL 0.4 MG PO CAPS: 0.4000 mg | ORAL_CAPSULE | Freq: Every day | ORAL | 3 refills | Status: DC

## 2022-01-14 NOTE — Patient Instructions (Addendum)
Please start taking tamsulosin as prescribed. ? ?You are being referred to urology. ? ? ?

## 2022-01-14 NOTE — Progress Notes (Signed)
?  ? ?Virtual Visit via Telephone Note  ? ?This visit type was conducted due to national recommendations for restrictions regarding the COVID-19 Pandemic (e.g. social distancing) in an effort to limit this patient's exposure and mitigate transmission in our community.  Due to his co-morbid illnesses, this patient is at least at moderate risk for complications without adequate follow up.  This format is felt to be most appropriate for this patient at this time.  The patient did not have access to video technology/had technical difficulties with video requiring transitioning to audio format only (telephone).  All issues noted in this document were discussed and addressed.  No physical exam could be performed with this format. ? ?Evaluation Performed:  Follow-up visit ? ?Date:  01/14/2022  ? ?ID:  Victor Conner, DOB 1966/01/31, MRN [id number] ? ?Patient Location: Home ?Provider Location: Office/Clinic ? ?Participants: Patient ?Location of Patient: Home ?Location of Provider: Telehealth ?Consent was obtain for visit to be over via telehealth. ?I verified that I am speaking with the correct person using two identifiers. ? ?PCP:  Lindell Spar, MD  ? ?Chief Complaint: Urinary hesitancy ? ?History of Present Illness:   ? ?Victor Conner is a 56 y.o. male who has televisit for complaint of urinary hesitancy over the last few months.  His UA was unremarkable during last office visit.  He currently denies any hematuria, and reports mild dysuria on an intermittent basis.  He denies any fever, chills, nausea or vomiting.  Denies any nocturia or urinary frequency.  His symptoms are better in the morning and feels lower abdominal pressure as the day goes. ? ?The patient does not have symptoms concerning for COVID-19 infection (fever, chills, cough, or new shortness of breath).  ? ?Past Medical, Surgical, Social History, Allergies, and Medications have been Reviewed. ? ?Past Medical History:  ?Diagnosis Date  ? Anxiety   ?  Arthritis   ? left knee  ? GERD (gastroesophageal reflux disease)   ? Hypercholesteremia   ? Hypertension   ? ?Past Surgical History:  ?Procedure Laterality Date  ? ANTERIOR CERVICAL DECOMP/DISCECTOMY FUSION N/A 05/09/2019  ? Procedure: C4-5, C5-6 ANTERIOR CERVICAL DECOMPRESSION/DISCECTOMY FUSION, ALLOGRAFT, PLATE;  Surgeon: Marybelle Killings, MD;  Location: [place];  Service: Orthopedics;  Laterality: N/A;  ? BONE CYST EXCISION    ? left leg  ? INCISION AND DRAINAGE Right   ? hand  ? TOTAL KNEE ARTHROPLASTY Left 03/16/2020  ? Procedure: LEFT TOTAL KNEE ARTHROPLASTY;  Surgeon: Mcarthur Rossetti, MD;  Location: [facility];  Service: Orthopedics;  Laterality: Left;  ?  ? ?Current Meds  ?Medication Sig  ? ALPRAZolam (XANAX) 0.5 MG tablet Take 1 tablet (0.5 mg total) by mouth 2 (two) times daily as needed for anxiety.  ? amLODipine (NORVASC) 10 MG tablet Take 0.5 tablets (5 mg total) by mouth daily.  ? Cholecalciferol (VITAMIN D-3) 125 MCG (5000 UT) TABS Take 5,000 Units by mouth daily.  ? lisinopril (ZESTRIL) 20 MG tablet Take 1 tablet (20 mg total) by mouth daily.  ? Multiple Vitamins-Minerals (MULTIVITAMIN WITH MINERALS) tablet Take 1 tablet by mouth 2 (two) times a week.   ? pantoprazole (PROTONIX) 40 MG tablet TAKE (1) TABLET BY MOUTH TWICE DAILY.  ? simvastatin (ZOCOR) 40 MG tablet Take 1 tablet (40 mg total) by mouth at bedtime.  ? sodium chloride (OCEAN) 0.65 % SOLN nasal spray Place 1 spray into both nostrils as needed for congestion.  ? tamsulosin (FLOMAX) 0.4 MG CAPS capsule Take  1 capsule (0.4 mg total) by mouth daily.  ?  ? ?Allergies:   Morphine and related and Shrimp [shellfish allergy]  ? ?ROS:   ?Please see the history of present illness.    ? ?All other systems reviewed and are negative. ? ? ?Labs/Other Tests and Data Reviewed:   ? ?Recent Labs: ?07/01/2021: ALT 23; Hemoglobin 15.2; Platelets 205; TSH 1.010 ?09/06/2021: BUN 15; Creatinine, Ser 1.10; Potassium 4.9; Sodium 142  ? ?Recent Lipid Panel ?Lab  Results  ?Component Value Date/Time  ? CHOL 189 07/01/2021 08:46 AM  ? TRIG 265 (H) 07/01/2021 08:46 AM  ? HDL 45 07/01/2021 08:46 AM  ? LDLCALC 99 07/01/2021 08:46 AM  ? ? ?Wt Readings from Last 3 Encounters:  ?12/20/21 296 lb (134.3 kg)  ?09/06/21 292 lb (132.5 kg)  ?06/28/21 288 lb 1.9 oz (130.7 kg)  ?  ? ?ASSESSMENT & PLAN:   ? ?Urinary hesitancy ?Could be due to BPH ?Will give a trial of Flomax ?Refer to urology for further evaluation ?Avoid heavy lifting to avoid psoas strain ? ?Time:   ?Today, I have spent 9 minutes reviewing the chart, including problem list, medications, and with the patient with telehealth technology discussing the above problems. ? ? ?Medication Adjustments/Labs and Tests Ordered: ?Current medicines are reviewed at length with the patient today.  Concerns regarding medicines are outlined above.  ? ?Tests Ordered: ?No orders of the defined types were placed in this encounter. ? ? ?Medication Changes: ?Meds ordered this encounter  ?Medications  ? tamsulosin (FLOMAX) 0.4 MG CAPS capsule  ?  Sig: Take 1 capsule (0.4 mg total) by mouth daily.  ?  Dispense:  30 capsule  ?  Refill:  3  ? ? ? ?Note: This dictation was prepared with Dragon dictation along with smaller phrase technology. Similar sounding words can be transcribed inadequately or may not be corrected upon review. Any transcriptional errors that result from this process are unintentional.  ?  ? ? ?Disposition:  Follow up  ?Signed, ?Lindell Spar, MD  ?01/14/2022 10:32 AM    ? ?[facility] ?[facility] ?

## 2022-01-27 ENCOUNTER — Ambulatory Visit: Payer: BC Managed Care – PPO | Admitting: Urology

## 2022-01-27 ENCOUNTER — Encounter: Payer: Self-pay | Admitting: Urology

## 2022-01-27 VITALS — BP 143/75 | HR 76 | Ht 76.0 in | Wt 282.0 lb

## 2022-01-27 DIAGNOSIS — N411 Chronic prostatitis: Secondary | ICD-10-CM

## 2022-01-27 DIAGNOSIS — R3912 Poor urinary stream: Secondary | ICD-10-CM

## 2022-01-27 DIAGNOSIS — N400 Enlarged prostate without lower urinary tract symptoms: Secondary | ICD-10-CM | POA: Diagnosis not present

## 2022-01-27 LAB — URINALYSIS, ROUTINE W REFLEX MICROSCOPIC
Bilirubin, UA: NEGATIVE
Glucose, UA: NEGATIVE
Ketones, UA: NEGATIVE
Leukocytes,UA: NEGATIVE
Nitrite, UA: NEGATIVE
RBC, UA: NEGATIVE
Specific Gravity, UA: 1.02 (ref 1.005–1.030)
Urobilinogen, Ur: 0.2 mg/dL (ref 0.2–1.0)
pH, UA: 7 (ref 5.0–7.5)

## 2022-01-27 MED ORDER — SULFAMETHOXAZOLE-TRIMETHOPRIM 800-160 MG PO TABS: 1.0000 | ORAL_TABLET | Freq: Two times a day (BID) | ORAL | 0 refills | Status: DC

## 2022-01-27 NOTE — Progress Notes (Signed)
? ?01/27/2022 ?8:45 AM  ? ?Randye Lobo Bohlken ?02-Nov-1965 ?[id number] ? ?Referring provider: Lindell Spar, MD ?[address] ?[place],  [place] [address] ? ?Weak urinary stream ? ? ?HPI: ?Mr Necaise is a 56yo here for evaluation of a weak urinary stream. IPSS 9 QOL 5. Starting 3 weeks ago he developed a weaker urinary stream, straining to urinate and urinary frequency every 45-60 minutes. He has intermittent dysuria. PVR 123cc. He was started on flomax 0.'4mg'$  daily which partially improved his LUTS.  ? ? ?PMH: ?Past Medical History:  ?Diagnosis Date  ? Anxiety   ? Arthritis   ? left knee  ? GERD (gastroesophageal reflux disease)   ? Hypercholesteremia   ? Hypertension   ? ? ?Surgical History: ?Past Surgical History:  ?Procedure Laterality Date  ? ANTERIOR CERVICAL DECOMP/DISCECTOMY FUSION N/A 05/09/2019  ? Procedure: C4-5, C5-6 ANTERIOR CERVICAL DECOMPRESSION/DISCECTOMY FUSION, ALLOGRAFT, PLATE;  Surgeon: Marybelle Killings, MD;  Location: [facility];  Service: Orthopedics;  Laterality: N/A;  ? BONE CYST EXCISION    ? left leg  ? INCISION AND DRAINAGE Right   ? hand  ? TOTAL KNEE ARTHROPLASTY Left 03/16/2020  ? Procedure: LEFT TOTAL KNEE ARTHROPLASTY;  Surgeon: Mcarthur Rossetti, MD;  Location: [facility];  Service: Orthopedics;  Laterality: Left;  ? ? ?Home Medications:  ?Allergies as of 01/27/2022   ? ?   Reactions  ? Morphine And Related Itching  ? Shrimp [shellfish Allergy] Swelling  ? Eyes/Throat swelling  ? ?  ? ?  ?Medication List  ?  ? ?  ? Accurate as of Jan 27, 2022  8:45 AM. If you have any questions, ask your nurse or doctor.  ?  ?  ? ?  ? ?ALPRAZolam 0.5 MG tablet ?Commonly known as: Duanne Moron ?Take 1 tablet (0.5 mg total) by mouth 2 (two) times daily as needed for anxiety. ?  ?amLODipine 10 MG tablet ?Commonly known as: NORVASC ?Take 0.5 tablets (5 mg total) by mouth daily. ?  ?lisinopril 20 MG tablet ?Commonly known as: ZESTRIL ?Take 1 tablet (20 mg total) by mouth daily. ?  ?multivitamin with minerals tablet ?Take 1  tablet by mouth 2 (two) times a week. ?  ?pantoprazole 40 MG tablet ?Commonly known as: PROTONIX ?TAKE (1) TABLET BY MOUTH TWICE DAILY. ?  ?simvastatin 40 MG tablet ?Commonly known as: ZOCOR ?Take 1 tablet (40 mg total) by mouth at bedtime. ?  ?sodium chloride 0.65 % Soln nasal spray ?Commonly known as: OCEAN ?Place 1 spray into both nostrils as needed for congestion. ?  ?tamsulosin 0.4 MG Caps capsule ?Commonly known as: FLOMAX ?Take 1 capsule (0.4 mg total) by mouth daily. ?  ?Vitamin D-3 125 MCG (5000 UT) Tabs ?Take 5,000 Units by mouth daily. ?  ? ?  ? ? ?Allergies:  ?Allergies  ?Allergen Reactions  ? Morphine And Related Itching  ? Shrimp [Shellfish Allergy] Swelling  ?  Eyes/Throat swelling  ? ? ?Family History: ?Family History  ?Problem Relation Age of Onset  ? Cancer Father   ? Cancer Sister   ? Heart disease Paternal Grandmother   ? ? ?Social History:  reports that he has never smoked. He has never used smokeless tobacco. He reports current alcohol use. He reports that he does not use drugs. ? ?ROS: ?All other review of systems were reviewed and are negative except what is noted above in HPI ? ?Physical Exam: ?BP (!) 143/75   Pulse 76   Ht '6\' 4"'$  (1.93 m)  Wt 282 lb (127.9 kg)   BMI 34.33 kg/m?   ?Constitutional:  Alert and oriented, No acute distress. ?HEENT: [place] AT, moist mucus membranes.  Trachea midline, no masses. ?Cardiovascular: No clubbing, cyanosis, or edema. ?Respiratory: Normal respiratory effort, no increased work of breathing. ?GI: Abdomen is soft, nontender, nondistended, no abdominal masses ?GU: No CVA tenderness. Circumcised phallus. No masses/lesions on penis, testis, scrotum. Prostate 30g smooth, tender no nodules no induration.  ?Lymph: No cervical or inguinal lymphadenopathy. ?Skin: No rashes, bruises or suspicious lesions. ?Neurologic: Grossly intact, no focal deficits, moving all 4 extremities. ?Psychiatric: Normal mood and affect. ? ?Laboratory Data: ?Lab Results  ?Component Value  Date  ? WBC 6.6 07/01/2021  ? HGB 15.2 07/01/2021  ? HCT 45.0 07/01/2021  ? MCV 90 07/01/2021  ? PLT 205 07/01/2021  ? ? ?Lab Results  ?Component Value Date  ? CREATININE 1.10 09/06/2021  ? ? ?No results found for: PSA ? ?No results found for: TESTOSTERONE ? ?No results found for: HGBA1C ? ?Urinalysis ?   ?Component Value Date/Time  ? BILIRUBINUR negative 12/20/2021 1043  ? KETONESUR negative 12/20/2021 1043  ? UROBILINOGEN 0.2 12/20/2021 1043  ? NITRITE Negative 12/20/2021 1043  ? LEUKOCYTESUR Negative 12/20/2021 1043  ? ? ?No results found for: LABMICR, [place], RBCUA, LABEPIT, MUCUS, BACTERIA ? ?Pertinent Imaging: ? ?No results found for this or any previous visit. ? ?No results found for this or any previous visit. ? ?No results found for this or any previous visit. ? ?No results found for this or any previous visit. ? ?No results found for this or any previous visit. ? ?No results found for this or any previous visit. ? ?No results found for this or any previous visit. ? ?No results found for this or any previous visit. ? ? ?Assessment & Plan:   ? ?1. Benign prostatic hyperplasia, unspecified whether lower urinary tract symptoms present ?-Continue flomax 0.'4mg'$  daily ?- Urinalysis, Routine w reflex microscopic ?- BLADDER SCAN AMB NON-IMAGING ? ?2. Weak urinary stream ?-Flomax 0.'4mg'$  daily ? ?3. Prostatitis ?-Bactrim DS BID for 28 days ? ?No follow-ups on file. ? ?Nicolette Bang, MD ? ?[facility] ?  ?

## 2022-01-27 NOTE — Progress Notes (Signed)
post void residual=123 

## 2022-01-27 NOTE — Patient Instructions (Signed)

## 2022-02-24 ENCOUNTER — Encounter: Payer: Self-pay | Admitting: Internal Medicine

## 2022-02-24 ENCOUNTER — Ambulatory Visit: Payer: BC Managed Care – PPO | Admitting: Internal Medicine

## 2022-02-24 ENCOUNTER — Ambulatory Visit: Payer: BC Managed Care – PPO | Admitting: Physician Assistant

## 2022-02-24 VITALS — BP 144/90 | HR 72 | Ht 76.0 in | Wt 293.0 lb

## 2022-02-24 DIAGNOSIS — I10 Essential (primary) hypertension: Secondary | ICD-10-CM

## 2022-02-24 DIAGNOSIS — F410 Panic disorder [episodic paroxysmal anxiety] without agoraphobia: Secondary | ICD-10-CM

## 2022-02-24 NOTE — Progress Notes (Signed)
Acute Office Visit  Subjective:    Patient ID: Victor Conner, male    DOB: Apr 14, 1966, 56 y.o.   MRN: [id number]  Chief Complaint  Patient presents with   Fatigue    And weak off and on for a while    Anxiety    Episode over the weekend     HPI Patient is in today for complaint of episode of flushing and palpitations in the last week.  On the same day, he took Sudafed to help with nasal congestion.  He had episodes of dizziness over the next 2 days.  His BP has been labile recently, and was low on 1 occasion - 99/60, but has been elevated lately - more than 140/80 for the last 2 days.  He denies any headache, dizziness, chest pain, dyspnea or palpitations today.  Of note, he also stopped taking Bactrim recently that was prescribed for chronic prostatitis, although he has missed only 2 days of treatment.  He has history of severe anxiety/panic, and does admit that most of his symptoms are better similar to panic episode that he usually gets when he flies in a plane.  Past Medical History:  Diagnosis Date   Anxiety    Arthritis    left knee   GERD (gastroesophageal reflux disease)    Hypercholesteremia    Hypertension     Past Surgical History:  Procedure Laterality Date   ANTERIOR CERVICAL DECOMP/DISCECTOMY FUSION N/A 05/09/2019   Procedure: C4-5, C5-6 ANTERIOR CERVICAL DECOMPRESSION/DISCECTOMY FUSION, ALLOGRAFT, PLATE;  Surgeon: Marybelle Killings, MD;  Location: [place];  Service: Orthopedics;  Laterality: N/A;   BONE CYST EXCISION     left leg   INCISION AND DRAINAGE Right    hand   TOTAL KNEE ARTHROPLASTY Left 03/16/2020   Procedure: LEFT TOTAL KNEE ARTHROPLASTY;  Surgeon: Mcarthur Rossetti, MD;  Location: [facility];  Service: Orthopedics;  Laterality: Left;    Family History  Problem Relation Age of Onset   Cancer Father    Cancer Sister    Heart disease Paternal Grandmother     Social History   Socioeconomic History   Marital status: Married    Spouse name:  Not on file   Number of children: 3   Years of education: Not on file   Highest education level: Not on file  Occupational History   Occupation: Self-employed- [employer]  Tobacco Use   Smoking status: Never   Smokeless tobacco: Never  Vaping Use   Vaping Use: Never used  Substance and Sexual Activity   Alcohol use: Yes    Comment: 18 beers per weekend   Drug use: Never   Sexual activity: Yes  Other Topics Concern   Not on file  Social History Narrative   Not on file   Social Determinants of Health   Financial Resource Strain: Not on file  Food Insecurity: Not on file  Transportation Needs: Not on file  Physical Activity: Not on file  Stress: Not on file  Social Connections: Not on file  Intimate Partner Violence: Not on file    Outpatient Medications Prior to Visit  Medication Sig Dispense Refill   ALPRAZolam (XANAX) 0.5 MG tablet Take 1 tablet (0.5 mg total) by mouth 2 (two) times daily as needed for anxiety. 30 tablet 0   amLODipine (NORVASC) 10 MG tablet Take 0.5 tablets (5 mg total) by mouth daily. 90 tablet 1   Cholecalciferol (VITAMIN D-3) 125 MCG (5000 UT) TABS Take 5,000 Units by  mouth daily.     lisinopril (ZESTRIL) 20 MG tablet Take 1 tablet (20 mg total) by mouth daily. 90 tablet 3   pantoprazole (PROTONIX) 40 MG tablet TAKE (1) TABLET BY MOUTH TWICE DAILY. 60 tablet 5   simvastatin (ZOCOR) 40 MG tablet Take 1 tablet (40 mg total) by mouth at bedtime. 30 tablet 4   sodium chloride (OCEAN) 0.65 % SOLN nasal spray Place 1 spray into both nostrils as needed for congestion. 15 mL 0   sulfamethoxazole-trimethoprim (BACTRIM DS) 800-160 MG tablet Take 1 tablet by mouth every 12 (twelve) hours. 56 tablet 0   tamsulosin (FLOMAX) 0.4 MG CAPS capsule Take 1 capsule (0.4 mg total) by mouth daily. 30 capsule 3   Multiple Vitamins-Minerals (MULTIVITAMIN WITH MINERALS) tablet Take 1 tablet by mouth 2 (two) times a week.  (Patient not taking: Reported on 02/24/2022)     No  facility-administered medications prior to visit.    Allergies  Allergen Reactions   Morphine And Related Itching   Shrimp [Shellfish Allergy] Swelling    Eyes/Throat swelling    Review of Systems  Constitutional:  Negative for chills and fever.  HENT:  Negative for congestion and sore throat.   Eyes:  Negative for pain and discharge.  Respiratory:  Negative for cough and shortness of breath.   Cardiovascular:  Negative for chest pain and palpitations.  Gastrointestinal:  Negative for diarrhea, nausea and vomiting.  Endocrine: Negative for polydipsia and polyuria.  Genitourinary:  Negative for dysuria and hematuria.  Musculoskeletal:  Negative for arthralgias, neck pain and neck stiffness.  Skin:  Negative for rash.  Neurological:  Negative for dizziness, weakness, numbness and headaches.  Psychiatric/Behavioral:  Negative for agitation and behavioral problems.       Objective:    Physical Exam Vitals reviewed.  Constitutional:      General: He is not in acute distress.    Appearance: He is obese. He is not diaphoretic.  HENT:     Head: Normocephalic and atraumatic.     Nose: Nose normal.     Mouth/Throat:     Mouth: Mucous membranes are moist.  Eyes:     General: No scleral icterus.    Extraocular Movements: Extraocular movements intact.  Cardiovascular:     Rate and Rhythm: Normal rate and regular rhythm.     Heart sounds: No murmur heard. Pulmonary:     Breath sounds: Normal breath sounds. No wheezing or rales.  Musculoskeletal:     Cervical back: Neck supple. No tenderness.     Right lower leg: No edema.     Left lower leg: No edema.  Skin:    General: Skin is warm.     Findings: No rash.  Neurological:     General: No focal deficit present.     Mental Status: He is alert and oriented to person, place, and time.     Sensory: No sensory deficit.     Motor: No weakness.  Psychiatric:        Mood and Affect: Mood normal.        Behavior: Behavior normal.     BP (!) 144/90 (BP Location: Right Arm, Cuff Size: Normal)   Pulse 72   Ht '6\' 4"'$  (1.93 m)   Wt 293 lb (132.9 kg)   SpO2 95%   BMI 35.67 kg/m  Wt Readings from Last 3 Encounters:  02/24/22 293 lb (132.9 kg)  01/27/22 282 lb (127.9 kg)  12/20/21 296 lb (134.3 kg)  Assessment & Plan:   Problem List Items Addressed This Visit       Cardiovascular and Mediastinum   HTN (hypertension) - Primary    BP Readings from Last 1 Encounters:  02/24/22 (!) 144/90  Has been labile recently, would avoid changing medication for now Advised to check BP at home and contact us after 1 week Used to be well-controlled with amlodipine and lisinopril Counseled for compliance with the medications Advised DASH diet and moderate exercise/walking, at least 150 mins/week        Other   Panic disorder    Has had episodes of panic in the past in crowd and especially while in plane Usually better with Xanax as needed -his recent episode of flushing and dizziness could be from panic         No orders of the defined types were placed in this encounter.    Lindell Spar, MD

## 2022-02-24 NOTE — Patient Instructions (Signed)
Please continue taking medications as prescribed.  Please contact us after 1 week with BP readings. If BP remains elevated more than 150/90 on 3 consecutive readings, please contact us sooner or get medical attention.

## 2022-02-24 NOTE — Assessment & Plan Note (Addendum)
Has had episodes of panic in the past in crowd and especially while in plane Usually better with Xanax as needed -his recent episode of flushing and dizziness could be from panic

## 2022-02-24 NOTE — Assessment & Plan Note (Signed)
BP Readings from Last 1 Encounters:  02/24/22 (!) 144/90   Has been labile recently, would avoid changing medication for now Advised to check BP at home and contact us after 1 week Used to be well-controlled with amlodipine and lisinopril Counseled for compliance with the medications Advised DASH diet and moderate exercise/walking, at least 150 mins/week

## 2022-02-25 ENCOUNTER — Ambulatory Visit (INDEPENDENT_AMBULATORY_CARE_PROVIDER_SITE_OTHER): Payer: BC Managed Care – PPO | Admitting: Physician Assistant

## 2022-02-25 VITALS — BP 146/87 | HR 72 | Ht 76.0 in | Wt 292.0 lb

## 2022-02-25 DIAGNOSIS — N400 Enlarged prostate without lower urinary tract symptoms: Secondary | ICD-10-CM

## 2022-02-25 DIAGNOSIS — N401 Enlarged prostate with lower urinary tract symptoms: Secondary | ICD-10-CM

## 2022-02-25 DIAGNOSIS — R3911 Hesitancy of micturition: Secondary | ICD-10-CM

## 2022-02-25 LAB — URINALYSIS, ROUTINE W REFLEX MICROSCOPIC
Bilirubin, UA: NEGATIVE
Glucose, UA: NEGATIVE
Ketones, UA: NEGATIVE
Leukocytes,UA: NEGATIVE
Nitrite, UA: NEGATIVE
Protein,UA: NEGATIVE
RBC, UA: NEGATIVE
Specific Gravity, UA: 1.015 (ref 1.005–1.030)
Urobilinogen, Ur: 0.2 mg/dL (ref 0.2–1.0)
pH, UA: 7 (ref 5.0–7.5)

## 2022-02-25 MED ORDER — SULFAMETHOXAZOLE-TRIMETHOPRIM 800-160 MG PO TABS: 1.0000 | ORAL_TABLET | Freq: Two times a day (BID) | ORAL | 0 refills | Status: AC

## 2022-02-25 MED ORDER — TAMSULOSIN HCL 0.4 MG PO CAPS: 0.4000 mg | ORAL_CAPSULE | Freq: Every day | ORAL | 3 refills | Status: DC

## 2022-02-25 NOTE — Progress Notes (Signed)
Assessment: 1. Benign prostatic hyperplasia, unspecified whether lower urinary tract symptoms present - Urinalysis, Routine w reflex microscopic    Plan: Complete a full 6-week course of Bactrim DS and another month of the Flomax.  He is advised to avoid riding his motorcycle or riding lawnmower over the next few weeks and will follow-up as needed if symptoms recur or worsen.  Yearly PSA checks with his primary care.  Chief Complaint: No chief complaint on file.   HPI: Victor Conner is a 56 y.o. male who presents for continued evaluation of acute prostatitis and BPH symptoms. Much better, but little discomfort since stopping Bactrim 4 days early-thought causing low BP. Also rode motorcycle over weekend before sxs increased slightly. UA clear PVR=49m  Motorcycle Portions of the above documentation were copied from a prior visit for review purposes only.  Allergies: Allergies  Allergen Reactions   Morphine And Related Itching   Shrimp [Shellfish Allergy] Swelling    Eyes/Throat swelling    PMH: Past Medical History:  Diagnosis Date   Anxiety    Arthritis    left knee   GERD (gastroesophageal reflux disease)    Hypercholesteremia    Hypertension     PSH: Past Surgical History:  Procedure Laterality Date   ANTERIOR CERVICAL DECOMP/DISCECTOMY FUSION N/A 05/09/2019   Procedure: C4-5, C5-6 ANTERIOR CERVICAL DECOMPRESSION/DISCECTOMY FUSION, ALLOGRAFT, PLATE;  Surgeon: YMarybelle Killings MD;  Location: [facility]  Service: Orthopedics;  Laterality: N/A;   BONE CYST EXCISION     left leg   INCISION AND DRAINAGE Right    hand   TOTAL KNEE ARTHROPLASTY Left 03/16/2020   Procedure: LEFT TOTAL KNEE ARTHROPLASTY;  Surgeon: BMcarthur Rossetti MD;  Location: [facility];  Service: Orthopedics;  Laterality: Left;    SH: Social History   Tobacco Use   Smoking status: Never   Smokeless tobacco: Never  Vaping Use   Vaping Use: Never used  Substance Use Topics   Alcohol use:  Yes    Comment: 18 beers per weekend   Drug use: Never    ROS: See HPI  PE: BP (!) 146/87   Pulse 72   Ht '6\' 4"'$  (1.93 m)   Wt 292 lb (132.5 kg)   BMI 35.54 kg/m  GENERAL APPEARANCE:  Well appearing, well developed, well nourished, NAD HEENT:  Atraumatic, normocephalic NECK:  Supple. Trachea midline ABDOMEN:  Soft, non-tender, no masses EXTREMITIES:  Moves all extremities well, without clubbing, cyanosis, or edema NEUROLOGIC:  Alert and oriented x 3, normal gait, CN II-XII grossly intact MENTAL STATUS:  appropriate BACK:  Non-tender to palpation, No CVAT SKIN:  Warm, dry, and intact   Results: Laboratory Data: Lab Results  Component Value Date   WBC 6.6 07/01/2021   HGB 15.2 07/01/2021   HCT 45.0 07/01/2021   MCV 90 07/01/2021   PLT 205 07/01/2021    Lab Results  Component Value Date   CREATININE 1.10 09/06/2021     Urinalysis    Component Value Date/Time   APPEARANCEUR Clear 01/27/2022 0908   GLUCOSEU Negative 01/27/2022 0908   BILIRUBINUR Negative 01/27/2022 0908   KETONESUR negative 12/20/2021 1043   PROTEINUR Trace (A) 01/27/2022 0908   UROBILINOGEN 0.2 12/20/2021 1043   NITRITE Negative 01/27/2022 0908   LEUKOCYTESUR Negative 01/27/2022 0908    Lab Results  Component Value Date   LABMICR Comment 01/27/2022    Pertinent Imaging:  No results found for this or any previous visit.  No results found for this  or any previous visit.  No results found for this or any previous visit.  No results found for this or any previous visit.  No results found for this or any previous visit.  No results found for this or any previous visit.  No results found for this or any previous visit.  No results found for this or any previous visit.  No results found for this or any previous visit (from the past 24 hour(s)).

## 2022-03-13 ENCOUNTER — Other Ambulatory Visit: Payer: Self-pay | Admitting: *Deleted

## 2022-03-13 MED ORDER — AMLODIPINE BESYLATE 10 MG PO TABS: 5.0000 mg | ORAL_TABLET | Freq: Every day | ORAL | 1 refills | Status: DC

## 2022-04-21 ENCOUNTER — Encounter: Payer: Self-pay | Admitting: Internal Medicine

## 2022-04-21 ENCOUNTER — Ambulatory Visit (INDEPENDENT_AMBULATORY_CARE_PROVIDER_SITE_OTHER): Payer: BC Managed Care – PPO | Admitting: Internal Medicine

## 2022-04-21 DIAGNOSIS — J0110 Acute frontal sinusitis, unspecified: Secondary | ICD-10-CM | POA: Diagnosis not present

## 2022-04-21 MED ORDER — AZITHROMYCIN 250 MG PO TABS: ORAL_TABLET | ORAL | 0 refills | Status: AC

## 2022-04-21 MED ORDER — FLUTICASONE PROPIONATE 50 MCG/ACT NA SUSP: 2.0000 | Freq: Every day | NASAL | 6 refills | Status: DC

## 2022-04-21 NOTE — Progress Notes (Signed)
Virtual Visit via Telephone Note   This visit type was conducted due to national recommendations for restrictions regarding the COVID-19 Pandemic (e.g. social distancing) in an effort to limit this patient's exposure and mitigate transmission in our community.  Due to his co-morbid illnesses, this patient is at least at moderate risk for complications without adequate follow up.  This format is felt to be most appropriate for this patient at this time.  The patient did not have access to video technology/had technical difficulties with video requiring transitioning to audio format only (telephone).  All issues noted in this document were discussed and addressed.  No physical exam could be performed with this format.  Evaluation Performed:  Follow-up visit  Date:  04/21/2022   ID:  Victor Conner, Victor Conner 08/23/1966, MRN [id number]  Patient Location: Home Provider Location: Office/Clinic  Participants: Patient Location of Patient: Home Location of Provider: Telehealth Consent was obtain for visit to be over via telehealth. I verified that I am speaking with the correct person using two identifiers.  PCP:  Lindell Spar, MD   Chief Complaint: Nasal congestion, sinus pressure and cough  History of Present Illness:    Victor Conner is a 56 y.o. male who has a televisit for c/o nasal congestion, sinus pressure related headache and cough for the last 3 days.  He denies any fever, chills, dyspnea or wheezing currently.  He states that his symptoms started after mowing grass.  He takes Claritin for allergies.  The patient does not have symptoms concerning for COVID-19 infection (fever, chills, cough, or new shortness of breath).   Past Medical, Surgical, Social History, Allergies, and Medications have been Reviewed.  Past Medical History:  Diagnosis Date   Anxiety    Arthritis    left knee   GERD (gastroesophageal reflux disease)    Hypercholesteremia    Hypertension    Past  Surgical History:  Procedure Laterality Date   ANTERIOR CERVICAL DECOMP/DISCECTOMY FUSION N/A 05/09/2019   Procedure: C4-5, C5-6 ANTERIOR CERVICAL DECOMPRESSION/DISCECTOMY FUSION, ALLOGRAFT, PLATE;  Surgeon: Marybelle Killings, MD;  Location: [place];  Service: Orthopedics;  Laterality: N/A;   BONE CYST EXCISION     left leg   INCISION AND DRAINAGE Right    hand   TOTAL KNEE ARTHROPLASTY Left 03/16/2020   Procedure: LEFT TOTAL KNEE ARTHROPLASTY;  Surgeon: Mcarthur Rossetti, MD;  Location: [facility];  Service: Orthopedics;  Laterality: Left;     Current Meds  Medication Sig   ALPRAZolam (XANAX) 0.5 MG tablet Take 1 tablet (0.5 mg total) by mouth 2 (two) times daily as needed for anxiety.   amLODipine (NORVASC) 10 MG tablet Take 0.5 tablets (5 mg total) by mouth daily.   Cholecalciferol (VITAMIN D-3) 125 MCG (5000 UT) TABS Take 5,000 Units by mouth daily.   lisinopril (ZESTRIL) 20 MG tablet Take 1 tablet (20 mg total) by mouth daily.   Multiple Vitamins-Minerals (MULTIVITAMIN WITH MINERALS) tablet Take 1 tablet by mouth 2 (two) times a week.   pantoprazole (PROTONIX) 40 MG tablet TAKE (1) TABLET BY MOUTH TWICE DAILY.   simvastatin (ZOCOR) 40 MG tablet Take 1 tablet (40 mg total) by mouth at bedtime.   sodium chloride (OCEAN) 0.65 % SOLN nasal spray Place 1 spray into both nostrils as needed for congestion.   tamsulosin (FLOMAX) 0.4 MG CAPS capsule Take 1 capsule (0.4 mg total) by mouth daily.     Allergies:   Morphine and related and Shrimp [shellfish  allergy]   ROS:   Please see the history of present illness.     All other systems reviewed and are negative.   Labs/Other Tests and Data Reviewed:    Recent Labs: 07/01/2021: ALT 23; Hemoglobin 15.2; Platelets 205; TSH 1.010 09/06/2021: BUN 15; Creatinine, Ser 1.10; Potassium 4.9; Sodium 142   Recent Lipid Panel Lab Results  Component Value Date/Time   CHOL 189 07/01/2021 08:46 AM   TRIG 265 (H) 07/01/2021 08:46 AM   HDL 45  07/01/2021 08:46 AM   LDLCALC 99 07/01/2021 08:46 AM    Wt Readings from Last 3 Encounters:  02/25/22 292 lb (132.5 kg)  02/24/22 293 lb (132.9 kg)  01/27/22 282 lb (127.9 kg)     ASSESSMENT & PLAN:    Acute sinusitis Persistent symptoms despite symptomatic treatment with Alka-Seltzer Nasal saline spray as needed for nasal congestion. Continue Claritin for allergies Flonase for allergies Azithromycin - advised to start after 2 days if persistent symptoms   Time:   Today, I have spent 9 minutes reviewing the chart, including problem list, medications, and with the patient with telehealth technology discussing the above problems.   Medication Adjustments/Labs and Tests Ordered: Current medicines are reviewed at length with the patient today.  Concerns regarding medicines are outlined above.   Tests Ordered: No orders of the defined types were placed in this encounter.   Medication Changes: No orders of the defined types were placed in this encounter.    Note: This dictation was prepared with Dragon dictation along with smaller phrase technology. Similar sounding words can be transcribed inadequately or may not be corrected upon review. Any transcriptional errors that result from this process are unintentional.      Disposition:  Follow up  Signed, Lindell Spar, MD  04/21/2022 1:26 PM     [facility]

## 2022-04-25 ENCOUNTER — Ambulatory Visit: Payer: BC Managed Care – PPO | Admitting: Internal Medicine

## 2022-05-07 ENCOUNTER — Telehealth: Payer: Self-pay

## 2022-05-07 NOTE — Telephone Encounter (Signed)
FYI Patient called to see if he needed antibiotics or if he needed and office visit to be evaluated.  Since he was last seen he had finished the antibiotics rx'd at last visit.  He has started riding his motorcycle again and had a long trip and since then he has been hurting again.  I scheduled him for an OV with you tomorrow.

## 2022-05-07 NOTE — Telephone Encounter (Signed)
Opened in error

## 2022-05-08 ENCOUNTER — Ambulatory Visit: Payer: BC Managed Care – PPO | Admitting: Physician Assistant

## 2022-05-08 VITALS — BP 162/89 | HR 82

## 2022-05-08 DIAGNOSIS — N410 Acute prostatitis: Secondary | ICD-10-CM

## 2022-05-08 DIAGNOSIS — N400 Enlarged prostate without lower urinary tract symptoms: Secondary | ICD-10-CM

## 2022-05-08 MED ORDER — DOXYCYCLINE HYCLATE 100 MG PO CAPS: 100.0000 mg | ORAL_CAPSULE | Freq: Two times a day (BID) | ORAL | 0 refills | Status: DC

## 2022-05-08 NOTE — Progress Notes (Signed)
Assessment: 1. Acute prostatitis - Urinalysis, Routine w reflex microscopic  2. Benign prostatic hyperplasia, unspecified whether lower urinary tract symptoms present    Plan: Doxy x 6 weeks and reschedule his appointment next week to return in 2 to 3 months for scheduled PSA.  That effects and risks of doxycycline discussed at length with the patient.  He will remain on Flomax  Chief Complaint: No chief complaint on file.   HPI: Victor Conner is a 56 y.o. male with history of BPH and prostatitis who presents for continued evaluation of symptoms of prostatitis including perineal discomfort lower abdominal pressure and some urinary urgency.  Symptoms began during a 6-hour motorcycle trip patient was on approximately 2 weeks ago.  He denies burning, dysuria, hematuria.  He feels he may have had some low-grade fevers but denies chills or body aches.  No flank pain and no nausea or vomiting.  UA= clear  02/25/22 Victor Conner is a 56 y.o. male who presents for continued evaluation of acute prostatitis and BPH symptoms. Much better, but little discomfort since stopping Bactrim 4 days early-thought causing low BP. Also rode motorcycle over weekend before sxs increased slightly. UA clear PVR=79m Portions of the above documentation were copied from a prior visit for review purposes only.  Allergies: Allergies  Allergen Reactions   Morphine And Related Itching   Shrimp [Shellfish Allergy] Swelling    Eyes/Throat swelling    PMH: Past Medical History:  Diagnosis Date   Anxiety    Arthritis    left knee   GERD (gastroesophageal reflux disease)    Hypercholesteremia    Hypertension     PSH: Past Surgical History:  Procedure Laterality Date   ANTERIOR CERVICAL DECOMP/DISCECTOMY FUSION N/A 05/09/2019   Procedure: C4-5, C5-6 ANTERIOR CERVICAL DECOMPRESSION/DISCECTOMY FUSION, ALLOGRAFT, PLATE;  Surgeon: YMarybelle Killings MD;  [place]  Service: Orthopedics;  Laterality:  N/A;   BONE CYST EXCISION     left leg   INCISION AND DRAINAGE Right    hand   TOTAL KNEE ARTHROPLASTY Left 03/16/2020   Procedure: LEFT TOTAL KNEE ARTHROPLASTY;  Surgeon: BMcarthur Rossetti MD;  Location: [facility];  Service: Orthopedics;  Laterality: Left;    SH: Social History   Tobacco Use   Smoking status: Never   Smokeless tobacco: Never  Vaping Use   Vaping Use: Never used  Substance Use Topics   Alcohol use: Yes    Comment: 18 beers per weekend   Drug use: Never    ROS: All other review of systems were reviewed and are negative except what is noted above in HPI  PE: BP (!) 162/89   Pulse 82  GENERAL APPEARANCE:  Well appearing, well developed, well nourished, NAD HEENT:  Atraumatic, normocephalic NECK:  Supple. Trachea midline ABDOMEN:  Soft, non-tender, no masses GU: prostate tender and boggy. No nodules EXTREMITIES:  Moves all extremities well, without clubbing, cyanosis, or edema NEUROLOGIC:  Alert and oriented x 3, normal gait, CN II-XII grossly intact MENTAL STATUS:  appropriate BACK:  Non-tender to palpation, No CVAT SKIN:  Warm, dry, and intact   Results: Laboratory Data: Lab Results  Component Value Date   WBC 6.6 07/01/2021   HGB 15.2 07/01/2021   HCT 45.0 07/01/2021   MCV 90 07/01/2021   PLT 205 07/01/2021    Lab Results  Component Value Date   CREATININE 1.10 09/06/2021    Urinalysis    Component Value Date/Time   APPEARANCEUR Clear 02/25/2022 1051  GLUCOSEU Negative 02/25/2022 1051   BILIRUBINUR Negative 02/25/2022 1051   KETONESUR negative 12/20/2021 1043   PROTEINUR Negative 02/25/2022 1051   UROBILINOGEN 0.2 12/20/2021 1043   NITRITE Negative 02/25/2022 1051   LEUKOCYTESUR Negative 02/25/2022 1051    Lab Results  Component Value Date   LABMICR Comment 02/25/2022    Pertinent Imaging: No results found for this or any previous visit.  No results found for this or any previous visit.  No results found for this or  any previous visit.  No results found for this or any previous visit.  No results found for this or any previous visit.  No results found for this or any previous visit.  No results found for this or any previous visit.  No results found for this or any previous visit.  No results found for this or any previous visit (from the past 24 hour(s)).

## 2022-05-09 LAB — URINALYSIS, ROUTINE W REFLEX MICROSCOPIC
Bilirubin, UA: NEGATIVE
Glucose, UA: NEGATIVE
Ketones, UA: NEGATIVE
Leukocytes,UA: NEGATIVE
Nitrite, UA: NEGATIVE
Protein,UA: NEGATIVE
RBC, UA: NEGATIVE
Specific Gravity, UA: 1.025 (ref 1.005–1.030)
Urobilinogen, Ur: 0.2 mg/dL (ref 0.2–1.0)
pH, UA: 7 (ref 5.0–7.5)

## 2022-05-16 ENCOUNTER — Ambulatory Visit: Payer: BC Managed Care – PPO | Admitting: Internal Medicine

## 2022-05-30 ENCOUNTER — Ambulatory Visit: Payer: BC Managed Care – PPO | Admitting: Urology

## 2022-06-28 ENCOUNTER — Other Ambulatory Visit: Payer: Self-pay | Admitting: Internal Medicine

## 2022-06-28 DIAGNOSIS — K219 Gastro-esophageal reflux disease without esophagitis: Secondary | ICD-10-CM

## 2022-07-30 ENCOUNTER — Ambulatory Visit (INDEPENDENT_AMBULATORY_CARE_PROVIDER_SITE_OTHER): Payer: BC Managed Care – PPO | Admitting: Internal Medicine

## 2022-07-30 ENCOUNTER — Encounter: Payer: Self-pay | Admitting: Internal Medicine

## 2022-07-30 DIAGNOSIS — J0110 Acute frontal sinusitis, unspecified: Secondary | ICD-10-CM | POA: Diagnosis not present

## 2022-07-30 MED ORDER — AZITHROMYCIN 250 MG PO TABS: ORAL_TABLET | ORAL | 0 refills | Status: AC

## 2022-07-30 NOTE — Progress Notes (Signed)
Virtual Visit via Telephone Note   This visit type was conducted via telephone. This format is felt to be most appropriate for this patient at this time.  The patient did not have access to video technology/had technical difficulties with video requiring transitioning to audio format only (telephone).  All issues noted in this document were discussed and addressed.  No physical exam could be performed with this format.  Evaluation Performed:  Follow-up visit  Date:  07/30/2022   ID:  Victor Conner, Victor Conner 04-16-1966, MRN [id number]  Patient Location: Home Provider Location: Office/Clinic  Participants: Patient Location of Patient: Home Location of Provider: Telehealth Consent was obtain for visit to be over via telehealth. I verified that I am speaking with the correct person using two identifiers.  PCP:  Lindell Spar, MD   Chief Complaint: Nasal congestion and left ear fullness  History of Present Illness:    Victor Conner is a 56 y.o. male who has a televisit for complaint of nasal congestion, postnasal drip and left ear fullness, recently worse for the last 1 week.  He has sinus pressure related headache as well.  He denies any fever or chills.  He has been using Flonase for allergic sinusitis, which had been helping him.  He denies any fever, chills, dyspnea or wheezing currently.  The patient does not have symptoms concerning for COVID-19 infection (fever, chills, cough, or new shortness of breath).   Past Medical, Surgical, Social History, Allergies, and Medications have been Reviewed.  Past Medical History:  Diagnosis Date   Anxiety    Arthritis    left knee   GERD (gastroesophageal reflux disease)    Hypercholesteremia    Hypertension    Past Surgical History:  Procedure Laterality Date   ANTERIOR CERVICAL DECOMP/DISCECTOMY FUSION N/A 05/09/2019   Procedure: C4-5, C5-6 ANTERIOR CERVICAL DECOMPRESSION/DISCECTOMY FUSION, ALLOGRAFT, PLATE;  Surgeon: Marybelle Killings, MD;  Location: [place];  Service: Orthopedics;  Laterality: N/A;   BONE CYST EXCISION     left leg   INCISION AND DRAINAGE Right    hand   TOTAL KNEE ARTHROPLASTY Left 03/16/2020   Procedure: LEFT TOTAL KNEE ARTHROPLASTY;  Surgeon: Mcarthur Rossetti, MD;  Location: [facility];  Service: Orthopedics;  Laterality: Left;     Current Meds  Medication Sig   ALPRAZolam (XANAX) 0.5 MG tablet Take 1 tablet (0.5 mg total) by mouth 2 (two) times daily as needed for anxiety.   amLODipine (NORVASC) 10 MG tablet Take 0.5 tablets (5 mg total) by mouth daily.   Cholecalciferol (VITAMIN D-3) 125 MCG (5000 UT) TABS Take 5,000 Units by mouth daily.   doxycycline (VIBRAMYCIN) 100 MG capsule Take 1 capsule (100 mg total) by mouth every 12 (twelve) hours.   fluticasone (FLONASE) 50 MCG/ACT nasal spray Place 2 sprays into both nostrils daily.   lisinopril (ZESTRIL) 20 MG tablet Take 1 tablet (20 mg total) by mouth daily.   Multiple Vitamins-Minerals (MULTIVITAMIN WITH MINERALS) tablet Take 1 tablet by mouth 2 (two) times a week.   pantoprazole (PROTONIX) 40 MG tablet TAKE (1) TABLET BY MOUTH TWICE DAILY.   simvastatin (ZOCOR) 40 MG tablet TAKE (1) TABLET BY MOUTH AT BEDTIME.   sodium chloride (OCEAN) 0.65 % SOLN nasal spray Place 1 spray into both nostrils as needed for congestion.   tamsulosin (FLOMAX) 0.4 MG CAPS capsule Take 1 capsule (0.4 mg total) by mouth daily.     Allergies:   Morphine and related and  Shrimp [shellfish allergy]   ROS:   Please see the history of present illness.     All other systems reviewed and are negative.   Labs/Other Tests and Data Reviewed:    Recent Labs: 09/06/2021: BUN 15; Creatinine, Ser 1.10; Potassium 4.9; Sodium 142   Recent Lipid Panel Lab Results  Component Value Date/Time   CHOL 189 07/01/2021 08:46 AM   TRIG 265 (H) 07/01/2021 08:46 AM   HDL 45 07/01/2021 08:46 AM   LDLCALC 99 07/01/2021 08:46 AM    Wt Readings from Last 3 Encounters:  02/25/22  292 lb (132.5 kg)  02/24/22 293 lb (132.9 kg)  01/27/22 282 lb (127.9 kg)     ASSESSMENT & PLAN:    Acute sinusitis Started empiric azithromycin as he has persistent symptoms despite symptomatic treatment Continue Flonase for allergies Nasal saline spray as needed for nasal congestion Advised to use humidifier and/or vaporizer  Time:   Today, I have spent 9 minutes reviewing the chart, including problem list, medications, and with the patient with telehealth technology discussing the above problems.   Medication Adjustments/Labs and Tests Ordered: Current medicines are reviewed at length with the patient today.  Concerns regarding medicines are outlined above.   Tests Ordered: No orders of the defined types were placed in this encounter.   Medication Changes: No orders of the defined types were placed in this encounter.    Note: This dictation was prepared with Dragon dictation along with smaller phrase technology. Similar sounding words can be transcribed inadequately or may not be corrected upon review. Any transcriptional errors that result from this process are unintentional.      Disposition:  Follow up  Signed, Lindell Spar, MD  07/30/2022 3:38 PM     [facility]

## 2022-08-12 ENCOUNTER — Encounter: Payer: Self-pay | Admitting: Urology

## 2022-08-12 ENCOUNTER — Ambulatory Visit (INDEPENDENT_AMBULATORY_CARE_PROVIDER_SITE_OTHER): Payer: BC Managed Care – PPO | Admitting: Urology

## 2022-08-12 VITALS — BP 163/80 | HR 71

## 2022-08-12 DIAGNOSIS — R3911 Hesitancy of micturition: Secondary | ICD-10-CM | POA: Diagnosis not present

## 2022-08-12 DIAGNOSIS — N410 Acute prostatitis: Secondary | ICD-10-CM | POA: Diagnosis not present

## 2022-08-12 DIAGNOSIS — N401 Enlarged prostate with lower urinary tract symptoms: Secondary | ICD-10-CM

## 2022-08-12 DIAGNOSIS — N400 Enlarged prostate without lower urinary tract symptoms: Secondary | ICD-10-CM

## 2022-08-12 DIAGNOSIS — R3912 Poor urinary stream: Secondary | ICD-10-CM | POA: Diagnosis not present

## 2022-08-12 MED ORDER — TAMSULOSIN HCL 0.4 MG PO CAPS: 0.4000 mg | ORAL_CAPSULE | Freq: Every day | ORAL | 11 refills | Status: DC

## 2022-08-12 NOTE — Progress Notes (Signed)
08/12/2022 11:37 AM   Victor Conner 04/28/1966 [id number]  Referring provider: Lindell Spar, MD [address] [place],  [place] [address]  Followup BPH and prostatitis  HPI: Victor Conner is a 56yo here for followup for BPH and prostatitis. IPSS 4 QOL 1 after finishing doxycycline. He has stopped flomax 0.'4mg'$ . His dysuria has improved. No dysuria. Urine stream strong. No straining to urinate. No other complaints today   PMH: Past Medical History:  Diagnosis Date   Anxiety    Arthritis    left knee   GERD (gastroesophageal reflux disease)    Hypercholesteremia    Hypertension     Surgical History: Past Surgical History:  Procedure Laterality Date   ANTERIOR CERVICAL DECOMP/DISCECTOMY FUSION N/A 05/09/2019   Procedure: C4-5, C5-6 ANTERIOR CERVICAL DECOMPRESSION/DISCECTOMY FUSION, ALLOGRAFT, PLATE;  Surgeon: Marybelle Killings, MD;  Location: [place];  Service: Orthopedics;  Laterality: N/A;   BONE CYST EXCISION     left leg   INCISION AND DRAINAGE Right    hand   TOTAL KNEE ARTHROPLASTY Left 03/16/2020   Procedure: LEFT TOTAL KNEE ARTHROPLASTY;  Surgeon: Mcarthur Rossetti, MD;  Location: [facility];  Service: Orthopedics;  Laterality: Left;    Home Medications:  Allergies as of 08/12/2022       Reactions   Morphine And Related Itching   Shrimp [shellfish Allergy] Swelling   Eyes/Throat swelling        Medication List        Accurate as of August 12, 2022 11:37 AM. If you have any questions, ask your nurse or doctor.          ALPRAZolam 0.5 MG tablet Commonly known as: XANAX Take 1 tablet (0.5 mg total) by mouth 2 (two) times daily as needed for anxiety.   amLODipine 10 MG tablet Commonly known as: NORVASC Take 0.5 tablets (5 mg total) by mouth daily.   doxycycline 100 MG capsule Commonly known as: VIBRAMYCIN Take 1 capsule (100 mg total) by mouth every 12 (twelve) hours.   fluticasone 50 MCG/ACT nasal spray Commonly known as: FLONASE Place 2  sprays into both nostrils daily.   lisinopril 20 MG tablet Commonly known as: ZESTRIL Take 1 tablet (20 mg total) by mouth daily.   multivitamin with minerals tablet Take 1 tablet by mouth 2 (two) times a week.   pantoprazole 40 MG tablet Commonly known as: PROTONIX TAKE (1) TABLET BY MOUTH TWICE DAILY.   simvastatin 40 MG tablet Commonly known as: ZOCOR TAKE (1) TABLET BY MOUTH AT BEDTIME.   sodium chloride 0.65 % Soln nasal spray Commonly known as: OCEAN Place 1 spray into both nostrils as needed for congestion.   tamsulosin 0.4 MG Caps capsule Commonly known as: FLOMAX Take 1 capsule (0.4 mg total) by mouth daily.   Vitamin D-3 125 MCG (5000 UT) Tabs Take 5,000 Units by mouth daily.        Allergies:  Allergies  Allergen Reactions   Morphine And Related Itching   Shrimp [Shellfish Allergy] Swelling    Eyes/Throat swelling    Family History: Family History  Problem Relation Age of Onset   Cancer Father    Cancer Sister    Heart disease Paternal Grandmother     Social History:  reports that he has never smoked. He has never used smokeless tobacco. He reports current alcohol use. He reports that he does not use drugs.  ROS: All other review of systems were reviewed and are negative except what  is noted above in HPI  Physical Exam: BP (!) 163/80   Pulse 71   Constitutional:  Alert and oriented, No acute distress. HEENT: Victor Conner AT, moist mucus membranes.  Trachea midline, no masses. Cardiovascular: No clubbing, cyanosis, or edema. Respiratory: Normal respiratory effort, no increased work of breathing. GI: Abdomen is soft, nontender, nondistended, no abdominal masses GU: No CVA tenderness.  Lymph: No cervical or inguinal lymphadenopathy. Skin: No rashes, bruises or suspicious lesions. Neurologic: Grossly intact, no focal deficits, moving all 4 extremities. Psychiatric: Normal mood and affect.  Laboratory Data: Lab Results  Component Value Date   WBC 6.6  07/01/2021   HGB 15.2 07/01/2021   HCT 45.0 07/01/2021   MCV 90 07/01/2021   PLT 205 07/01/2021    Lab Results  Component Value Date   CREATININE 1.10 09/06/2021    No results found for: "PSA"  No results found for: "TESTOSTERONE"  No results found for: "HGBA1C"  Urinalysis    Component Value Date/Time   APPEARANCEUR Clear 05/08/2022 1504   GLUCOSEU Negative 05/08/2022 1504   BILIRUBINUR Negative 05/08/2022 1504   KETONESUR negative 12/20/2021 1043   PROTEINUR Negative 05/08/2022 1504   UROBILINOGEN 0.2 12/20/2021 1043   NITRITE Negative 05/08/2022 1504   LEUKOCYTESUR Negative 05/08/2022 1504    Lab Results  Component Value Date   LABMICR Comment 02/25/2022    Pertinent Imaging:  No results found for this or any previous visit.  No results found for this or any previous visit.  No results found for this or any previous visit.  No results found for this or any previous visit.  No results found for this or any previous visit.  No valid procedures specified. No results found for this or any previous visit.  No results found for this or any previous visit.   Assessment & Plan:    1. Chronic prostatitis -improved after antibiotic therapy - Urinalysis, Routine w reflex microscopic  2. Benign prostatic hyperplasia, unspecified whether lower urinary tract symptoms present -patient defers therapy at this time  3. Weak urinary stream -resolved   No follow-ups on file.  Nicolette Bang, MD  [facility] [place]

## 2022-08-13 LAB — URINALYSIS, ROUTINE W REFLEX MICROSCOPIC
Bilirubin, UA: NEGATIVE
Glucose, UA: NEGATIVE
Ketones, UA: NEGATIVE
Leukocytes,UA: NEGATIVE
Nitrite, UA: NEGATIVE
Protein,UA: NEGATIVE
RBC, UA: NEGATIVE
Specific Gravity, UA: 1.01 (ref 1.005–1.030)
Urobilinogen, Ur: 0.2 mg/dL (ref 0.2–1.0)
pH, UA: 7 (ref 5.0–7.5)

## 2022-08-16 ENCOUNTER — Other Ambulatory Visit: Payer: Self-pay | Admitting: Internal Medicine

## 2022-08-19 ENCOUNTER — Encounter: Payer: Self-pay | Admitting: Urology

## 2022-08-19 NOTE — Patient Instructions (Signed)

## 2022-09-23 ENCOUNTER — Other Ambulatory Visit: Payer: Self-pay | Admitting: Internal Medicine

## 2022-09-23 DIAGNOSIS — K219 Gastro-esophageal reflux disease without esophagitis: Secondary | ICD-10-CM

## 2022-09-30 ENCOUNTER — Other Ambulatory Visit: Payer: Self-pay

## 2022-09-30 DIAGNOSIS — I10 Essential (primary) hypertension: Secondary | ICD-10-CM

## 2022-09-30 MED ORDER — LISINOPRIL 20 MG PO TABS: 20.0000 mg | ORAL_TABLET | Freq: Every day | ORAL | 3 refills | Status: DC

## 2022-10-31 ENCOUNTER — Other Ambulatory Visit: Payer: Self-pay | Admitting: Internal Medicine

## 2022-10-31 ENCOUNTER — Other Ambulatory Visit: Payer: Self-pay

## 2022-10-31 DIAGNOSIS — K219 Gastro-esophageal reflux disease without esophagitis: Secondary | ICD-10-CM

## 2022-10-31 DIAGNOSIS — E782 Mixed hyperlipidemia: Secondary | ICD-10-CM

## 2022-10-31 MED ORDER — SIMVASTATIN 40 MG PO TABS: ORAL_TABLET | ORAL | 0 refills | Status: DC

## 2022-10-31 MED ORDER — PANTOPRAZOLE SODIUM 40 MG PO TBEC: DELAYED_RELEASE_TABLET | ORAL | 0 refills | Status: DC

## 2022-11-10 ENCOUNTER — Telehealth: Payer: Self-pay

## 2022-11-10 ENCOUNTER — Other Ambulatory Visit: Payer: Self-pay

## 2022-11-10 DIAGNOSIS — N411 Chronic prostatitis: Secondary | ICD-10-CM

## 2022-11-10 MED ORDER — DOXYCYCLINE HYCLATE 100 MG PO CAPS: 100.0000 mg | ORAL_CAPSULE | Freq: Two times a day (BID) | ORAL | 0 refills | Status: DC

## 2022-11-10 NOTE — Telephone Encounter (Signed)
Symptoms reviewed with  Dr. Alyson Ingles. Doxycycline po bid x 28 days sent to pharmacy. Patient made aware.

## 2022-11-10 NOTE — Telephone Encounter (Signed)
Patient expressed having past issues of pain in prostate mostly when sitting.  He was doing well until he finished the antibiotic. Stopped taking the tamsulosin (FLOMAX) 0.4 MG CAPS capsule due to racing heart during the night.  He started back taking tamsulosin (FLOMAX) 0.4 MG CAPS capsule in the morning.  He is not having an issue with racing heart feeling.  Please advise.

## 2022-12-15 ENCOUNTER — Encounter: Payer: Self-pay | Admitting: Internal Medicine

## 2022-12-15 ENCOUNTER — Ambulatory Visit: Payer: BC Managed Care – PPO | Admitting: Internal Medicine

## 2022-12-15 VITALS — BP 158/92 | HR 76 | Ht 76.0 in | Wt 285.6 lb

## 2022-12-15 DIAGNOSIS — I10 Essential (primary) hypertension: Secondary | ICD-10-CM | POA: Diagnosis not present

## 2022-12-15 DIAGNOSIS — N401 Enlarged prostate with lower urinary tract symptoms: Secondary | ICD-10-CM | POA: Diagnosis not present

## 2022-12-15 DIAGNOSIS — E559 Vitamin D deficiency, unspecified: Secondary | ICD-10-CM

## 2022-12-15 DIAGNOSIS — M5441 Lumbago with sciatica, right side: Secondary | ICD-10-CM | POA: Diagnosis not present

## 2022-12-15 DIAGNOSIS — Z1211 Encounter for screening for malignant neoplasm of colon: Secondary | ICD-10-CM

## 2022-12-15 DIAGNOSIS — R739 Hyperglycemia, unspecified: Secondary | ICD-10-CM

## 2022-12-15 DIAGNOSIS — K219 Gastro-esophageal reflux disease without esophagitis: Secondary | ICD-10-CM | POA: Diagnosis not present

## 2022-12-15 DIAGNOSIS — E782 Mixed hyperlipidemia: Secondary | ICD-10-CM

## 2022-12-15 DIAGNOSIS — R3911 Hesitancy of micturition: Secondary | ICD-10-CM | POA: Diagnosis not present

## 2022-12-15 MED ORDER — CYCLOBENZAPRINE HCL 10 MG PO TABS: 10.0000 mg | ORAL_TABLET | Freq: Two times a day (BID) | ORAL | 0 refills | Status: DC | PRN

## 2022-12-15 MED ORDER — AMLODIPINE BESYLATE 10 MG PO TABS: 10.0000 mg | ORAL_TABLET | Freq: Every day | ORAL | 1 refills | Status: DC

## 2022-12-15 MED ORDER — ESOMEPRAZOLE MAGNESIUM 40 MG PO CPDR: 40.0000 mg | DELAYED_RELEASE_CAPSULE | Freq: Every day | ORAL | 5 refills | Status: DC

## 2022-12-15 NOTE — Assessment & Plan Note (Signed)
On Flomax Check PSA Followed by urology

## 2022-12-15 NOTE — Progress Notes (Signed)
Established Patient Office Visit  Subjective:  Patient ID: Victor Conner, male    DOB: 1966/03/30  Age: 57 y.o. MRN: [id number]  CC:  Chief Complaint  Patient presents with   Gastroesophageal Reflux    Patient would like to change his acid reflux medication, he feels his current medication is not working. Patient is also having cramping and pain in the back of his right leg.    HPI Victor Conner is a 57 y.o. male with past medical history of HTN, GERD, HLD, cervical spinal stenosis and panic disorder who presents for f/u of his chronic medical conditions.   HTN: BP is elevated today.  His blood pressure has been elevated at other providers offices as well. Takes medications regularly. Patient denies headache, dizziness, chest pain, dyspnea or palpitations.  GERD: He is currently taking pantoprazole twice daily, and still complains of heartburn at times.  Denies any nausea or vomiting currently.  Denies any melena or hematochezia currently.  He also complains of mild low back pain and muscle cramping in the back of the right thigh for the last 3 weeks, since 03/04.  Pain is constant, dull and worse with standing.  He went to [place] in the last week on a field trip and had walked about 10 miles in 2-3 days.  He denies any numbness or tingling of the LE.  Denies any injury or fall.    Past Medical History:  Diagnosis Date   Anxiety    Arthritis    left knee   GERD (gastroesophageal reflux disease)    Hypercholesteremia    Hypertension     Past Surgical History:  Procedure Laterality Date   ANTERIOR CERVICAL DECOMP/DISCECTOMY FUSION N/A 05/09/2019   Procedure: C4-5, C5-6 ANTERIOR CERVICAL DECOMPRESSION/DISCECTOMY FUSION, ALLOGRAFT, PLATE;  Surgeon: Marybelle Killings, MD;  Location: [facility];  Service: Orthopedics;  Laterality: N/A;   BONE CYST EXCISION     left leg   INCISION AND DRAINAGE Right    hand   TOTAL KNEE ARTHROPLASTY Left 03/16/2020   Procedure: LEFT TOTAL KNEE  ARTHROPLASTY;  Surgeon: Mcarthur Rossetti, MD;  Location: [facility];  Service: Orthopedics;  Laterality: Left;    Family History  Problem Relation Age of Onset   Cancer Father    Cancer Sister    Heart disease Paternal Grandmother     Social History   Socioeconomic History   Marital status: Married    Spouse name: Not on file   Number of children: 3   Years of education: Not on file   Highest education level: Not on file  Occupational History   Occupation: Self-employed- [employer]  Tobacco Use   Smoking status: Never   Smokeless tobacco: Never  Vaping Use   Vaping Use: Never used  Substance and Sexual Activity   Alcohol use: Yes    Comment: 18 beers per weekend   Drug use: Never   Sexual activity: Yes  Other Topics Concern   Not on file  Social History Narrative   Not on file   Social Determinants of Health   Financial Resource Strain: Not on file  Food Insecurity: Not on file  Transportation Needs: Not on file  Physical Activity: Not on file  Stress: Not on file  Social Connections: Not on file  Intimate Partner Violence: Not on file    Outpatient Medications Prior to Visit  Medication Sig Dispense Refill   doxycycline (VIBRAMYCIN) 100 MG capsule Take 1 capsule (100 mg total) by  mouth 2 (two) times daily. 56 capsule 0   ALPRAZolam (XANAX) 0.5 MG tablet Take 1 tablet (0.5 mg total) by mouth 2 (two) times daily as needed for anxiety. 30 tablet 0   Cholecalciferol (VITAMIN D-3) 125 MCG (5000 UT) TABS Take 5,000 Units by mouth daily.     fluticasone (FLONASE) 50 MCG/ACT nasal spray Place 2 sprays into both nostrils daily. 16 g 6   lisinopril (ZESTRIL) 20 MG tablet Take 1 tablet (20 mg total) by mouth daily. 90 tablet 3   Multiple Vitamins-Minerals (MULTIVITAMIN WITH MINERALS) tablet Take 1 tablet by mouth 2 (two) times a week.     simvastatin (ZOCOR) 40 MG tablet TAKE (1) TABLET BY MOUTH AT BEDTIME. 30 tablet 0   sodium chloride (OCEAN) 0.65 % SOLN nasal spray Place  1 spray into both nostrils as needed for congestion. 15 mL 0   tamsulosin (FLOMAX) 0.4 MG CAPS capsule Take 1 capsule (0.4 mg total) by mouth daily after supper. 30 capsule 11   amLODipine (NORVASC) 10 MG tablet Take 0.5 tablets (5 mg total) by mouth daily. 90 tablet 1   doxycycline (VIBRAMYCIN) 100 MG capsule Take 1 capsule (100 mg total) by mouth every 12 (twelve) hours. 84 capsule 0   pantoprazole (PROTONIX) 40 MG tablet Patient needs appt before next refill 60 tablet 0   No facility-administered medications prior to visit.    Allergies  Allergen Reactions   Morphine And Related Itching   Shrimp [Shellfish Allergy] Swelling    Eyes/Throat swelling    ROS Review of Systems  Constitutional:  Negative for chills and fever.  HENT:  Negative for congestion and sore throat.   Eyes:  Negative for pain and discharge.  Respiratory:  Negative for cough and shortness of breath.   Cardiovascular:  Negative for chest pain and palpitations.  Gastrointestinal:  Negative for diarrhea, nausea and vomiting.  Endocrine: Negative for polydipsia and polyuria.  Genitourinary:  Negative for dysuria and hematuria.       Groin pain  Musculoskeletal:  Positive for back pain and neck pain. Negative for arthralgias.  Skin:  Negative for rash.  Neurological:  Negative for dizziness, weakness, numbness and headaches.  Psychiatric/Behavioral:  Negative for agitation and behavioral problems.       Objective:    Physical Exam Vitals reviewed.  Constitutional:      General: He is not in acute distress.    Appearance: He is obese. He is not diaphoretic.  HENT:     Head: Normocephalic and atraumatic.     Nose: Nose normal.     Mouth/Throat:     Mouth: Mucous membranes are moist.  Eyes:     General: No scleral icterus.    Extraocular Movements: Extraocular movements intact.  Cardiovascular:     Rate and Rhythm: Normal rate and regular rhythm.     Heart sounds: No murmur heard. Pulmonary:      Breath sounds: Normal breath sounds. No wheezing or rales.  Musculoskeletal:     Cervical back: Neck supple. No tenderness.     Lumbar back: Tenderness present. Positive right straight leg raise test.     Right lower leg: No edema.     Left lower leg: No edema.  Skin:    General: Skin is warm.     Findings: No rash.  Neurological:     General: No focal deficit present.     Mental Status: He is alert and oriented to person, place, and time.  Sensory: No sensory deficit.     Motor: No weakness.  Psychiatric:        Mood and Affect: Mood normal.        Behavior: Behavior normal.     BP (!) 158/92 (BP Location: Left Arm)   Pulse 76   Ht 6\' 4"  (1.93 m)   Wt 285 lb 9.6 oz (129.5 kg)   SpO2 97%   BMI 34.76 kg/m  Wt Readings from Last 3 Encounters:  12/15/22 285 lb 9.6 oz (129.5 kg)  02/25/22 292 lb (132.5 kg)  02/24/22 293 lb (132.9 kg)    Lab Results  Component Value Date   TSH 1.010 07/01/2021   Lab Results  Component Value Date   WBC 6.6 07/01/2021   HGB 15.2 07/01/2021   HCT 45.0 07/01/2021   MCV 90 07/01/2021   PLT 205 07/01/2021   Lab Results  Component Value Date   NA 142 09/06/2021   K 4.9 09/06/2021   CO2 22 09/06/2021   GLUCOSE 104 (H) 09/06/2021   BUN 15 09/06/2021   CREATININE 1.10 09/06/2021   BILITOT <0.2 07/01/2021   ALKPHOS 68 07/01/2021   AST 24 07/01/2021   ALT 23 07/01/2021   PROT 7.1 07/01/2021   ALBUMIN 4.6 07/01/2021   CALCIUM 9.6 09/06/2021   ANIONGAP 9 03/09/2020   EGFR 80 09/06/2021   Lab Results  Component Value Date   CHOL 189 07/01/2021   Lab Results  Component Value Date   HDL 45 07/01/2021   Lab Results  Component Value Date   LDLCALC 99 07/01/2021   Lab Results  Component Value Date   TRIG 265 (H) 07/01/2021   No results found for: "CHOLHDL" No results found for: "HGBA1C"    Assessment & Plan:   Problem List Items Addressed This Visit       Cardiovascular and Mediastinum   HTN (hypertension)    BP  Readings from Last 1 Encounters:  12/15/22 (!) 158/92   Uncontrolled with amlodipine 5 mg QD and lisinopril 20 mg QD Increased dose of amlodipine to 10 mg QD Counseled for compliance with the medications Advised DASH diet and moderate exercise/walking, at least 150 mins/week      Relevant Medications   amLODipine (NORVASC) 10 MG tablet   Other Relevant Orders   TSH   CMP14+EGFR   CBC with Differential/Platelet     Digestive   GERD (gastroesophageal reflux disease) - Primary    Uncontrolled with Protonix Switched to Nexium 40 mg daily Advised to take Pepcid at nighttime as needed Avoid caffeine related products and take dinner at least 2 hours prior to bedtime      Relevant Medications   esomeprazole (NEXIUM) 40 MG capsule     Nervous and Auditory   Acute low back pain with right-sided sciatica    Acute low back pain likely sciatica Avoid heavy lifting and frequent bending Simple back exercises Tylenol or ibuprofen as needed for pain Flexeril as needed for muscle spasms      Relevant Medications   cyclobenzaprine (FLEXERIL) 10 MG tablet     Genitourinary   Benign prostatic hyperplasia with urinary hesitancy    On Flomax Check PSA Followed by urology      Relevant Orders   PSA     Other   HLD (hyperlipidemia)    On simvastatin Check lipid profile      Relevant Medications   amLODipine (NORVASC) 10 MG tablet   Other Relevant Orders   Lipid  panel   Other Visit Diagnoses     Hyperglycemia       Relevant Orders   Hemoglobin A1c   Vitamin D deficiency       Relevant Orders   VITAMIN D 25 Hydroxy (Vit-D Deficiency, Fractures)   Colon cancer screening       Relevant Orders   Cologuard       Meds ordered this encounter  Medications   esomeprazole (NEXIUM) 40 MG capsule    Sig: Take 1 capsule (40 mg total) by mouth daily.    Dispense:  30 capsule    Refill:  5   amLODipine (NORVASC) 10 MG tablet    Sig: Take 1 tablet (10 mg total) by mouth daily.     Dispense:  90 tablet    Refill:  1   cyclobenzaprine (FLEXERIL) 10 MG tablet    Sig: Take 1 tablet (10 mg total) by mouth 2 (two) times daily as needed for muscle spasms.    Dispense:  30 tablet    Refill:  0    Follow-up: Return in about 6 weeks (around 01/26/2023) for Annual physical.    Lindell Spar, MD

## 2022-12-15 NOTE — Assessment & Plan Note (Signed)
Acute low back pain likely sciatica Avoid heavy lifting and frequent bending Simple back exercises Tylenol or ibuprofen as needed for pain Flexeril as needed for muscle spasms

## 2022-12-15 NOTE — Assessment & Plan Note (Signed)
BP Readings from Last 1 Encounters:  12/15/22 (!) 158/92    Uncontrolled with amlodipine 5 mg QD and lisinopril 20 mg QD Increased dose of amlodipine to 10 mg QD Counseled for compliance with the medications Advised DASH diet and moderate exercise/walking, at least 150 mins/week

## 2022-12-15 NOTE — Assessment & Plan Note (Signed)
On simvastatin °Check lipid profile °

## 2022-12-15 NOTE — Assessment & Plan Note (Signed)
Uncontrolled with Protonix Switched to Nexium 40 mg daily Advised to take Pepcid at nighttime as needed Avoid caffeine related products and take dinner at least 2 hours prior to bedtime

## 2022-12-15 NOTE — Patient Instructions (Addendum)
Please start taking Nexium once daily. Okay to take Pepcid in the evening as needed for acid reflux.  Please take dinner at least 2 hours before the bedtime.  Please start taking Amlodipine 10 mg once daily instead of 5 mg.  Please continue taking other medications as prescribed.  Please continue to follow low salt diet and perform moderate exercise/walking at least 150 mins/week.

## 2022-12-16 LAB — CMP14+EGFR
ALT: 23 IU/L (ref 0–44)
AST: 24 IU/L (ref 0–40)
Albumin/Globulin Ratio: 1.7 (ref 1.2–2.2)
Albumin: 4.6 g/dL (ref 3.8–4.9)
Alkaline Phosphatase: 68 IU/L (ref 44–121)
BUN/Creatinine Ratio: 18 (ref 9–20)
BUN: 21 mg/dL (ref 6–24)
Bilirubin Total: 0.3 mg/dL (ref 0.0–1.2)
CO2: 23 mmol/L (ref 20–29)
Calcium: 10 mg/dL (ref 8.7–10.2)
Chloride: 101 mmol/L (ref 96–106)
Creatinine, Ser: 1.18 mg/dL (ref 0.76–1.27)
Globulin, Total: 2.7 g/dL (ref 1.5–4.5)
Glucose: 105 mg/dL — ABNORMAL HIGH (ref 70–99)
Potassium: 5.2 mmol/L (ref 3.5–5.2)
Sodium: 140 mmol/L (ref 134–144)
Total Protein: 7.3 g/dL (ref 6.0–8.5)
eGFR: 72 mL/min/{1.73_m2} (ref >59)

## 2022-12-16 LAB — LIPID PANEL
Chol/HDL Ratio: 2.7 ratio (ref 0.0–5.0)
Cholesterol, Total: 181 mg/dL (ref 100–199)
HDL: 66 mg/dL
LDL Chol Calc (NIH): 102 mg/dL — ABNORMAL HIGH (ref 0–99)
Triglycerides: 70 mg/dL (ref 0–149)
VLDL Cholesterol Cal: 13 mg/dL (ref 5–40)

## 2022-12-16 LAB — CBC WITH DIFFERENTIAL/PLATELET
Basophils Absolute: 0.1 10*3/uL (ref 0.0–0.2)
Basos: 1 %
EOS (ABSOLUTE): 0.2 10*3/uL (ref 0.0–0.4)
Eos: 3 %
Hematocrit: 46.3 % (ref 37.5–51.0)
Hemoglobin: 15.7 g/dL (ref 13.0–17.7)
Immature Grans (Abs): 0 10*3/uL (ref 0.0–0.1)
Immature Granulocytes: 0 %
Lymphocytes Absolute: 2.4 10*3/uL (ref 0.7–3.1)
Lymphs: 31 %
MCH: 30.4 pg (ref 26.6–33.0)
MCHC: 33.9 g/dL (ref 31.5–35.7)
MCV: 90 fL (ref 79–97)
Monocytes Absolute: 0.6 10*3/uL (ref 0.1–0.9)
Monocytes: 8 %
Neutrophils Absolute: 4.5 10*3/uL (ref 1.4–7.0)
Neutrophils: 57 %
Platelets: 204 10*3/uL (ref 150–450)
RBC: 5.17 x10E6/uL (ref 4.14–5.80)
RDW: 12.6 % (ref 11.6–15.4)
WBC: 7.9 10*3/uL (ref 3.4–10.8)

## 2022-12-16 LAB — TSH: TSH: 0.898 u[IU]/mL (ref 0.450–4.500)

## 2022-12-16 LAB — PSA: Prostate Specific Ag, Serum: 0.9 ng/mL (ref 0.0–4.0)

## 2022-12-16 LAB — HEMOGLOBIN A1C
Est. average glucose Bld gHb Est-mCnc: 117 mg/dL
Hgb A1c MFr Bld: 5.7 % — ABNORMAL HIGH (ref 4.8–5.6)

## 2022-12-16 LAB — VITAMIN D 25 HYDROXY (VIT D DEFICIENCY, FRACTURES): Vit D, 25-Hydroxy: 68 ng/mL (ref 30.0–100.0)

## 2022-12-27 ENCOUNTER — Other Ambulatory Visit: Payer: Self-pay | Admitting: Internal Medicine

## 2022-12-27 DIAGNOSIS — E782 Mixed hyperlipidemia: Secondary | ICD-10-CM

## 2023-01-12 DIAGNOSIS — Z1211 Encounter for screening for malignant neoplasm of colon: Secondary | ICD-10-CM | POA: Diagnosis not present

## 2023-01-21 LAB — COLOGUARD: COLOGUARD: POSITIVE — AB

## 2023-01-22 ENCOUNTER — Other Ambulatory Visit: Payer: Self-pay

## 2023-01-22 DIAGNOSIS — R195 Other fecal abnormalities: Secondary | ICD-10-CM

## 2023-01-27 NOTE — Progress Notes (Unsigned)
Referring Provider: Anabel Halon, MD Primary Care Physician:  Anabel Halon, MD Primary Gastroenterologist:  Dr. Jena Gauss  No chief complaint on file.   HPI:   Victor Conner is a 57 y.o. male presenting today at the request of Anabel Halon, MD for positive cologuard completed 01/12/23.   Reviewed recent labs 3/25- Hgb 15.7, LFTs normal, Hgb A1c 5.7.  Past Medical History:  Diagnosis Date   Anxiety    Arthritis    left knee   GERD (gastroesophageal reflux disease)    Hypercholesteremia    Hypertension     Past Surgical History:  Procedure Laterality Date   ANTERIOR CERVICAL DECOMP/DISCECTOMY FUSION N/A 05/09/2019   Procedure: C4-5, C5-6 ANTERIOR CERVICAL DECOMPRESSION/DISCECTOMY FUSION, ALLOGRAFT, PLATE;  Surgeon: Eldred Manges, MD;  Location: [facility];  Service: Orthopedics;  Laterality: N/A;   BONE CYST EXCISION     left leg   INCISION AND DRAINAGE Right    hand   TOTAL KNEE ARTHROPLASTY Left 03/16/2020   Procedure: LEFT TOTAL KNEE ARTHROPLASTY;  Surgeon: Kathryne Hitch, MD;  Location: [facility];  Service: Orthopedics;  Laterality: Left;    Current Outpatient Medications  Medication Sig Dispense Refill   doxycycline (VIBRAMYCIN) 100 MG capsule Take 1 capsule (100 mg total) by mouth 2 (two) times daily. 56 capsule 0   ALPRAZolam (XANAX) 0.5 MG tablet Take 1 tablet (0.5 mg total) by mouth 2 (two) times daily as needed for anxiety. 30 tablet 0   amLODipine (NORVASC) 10 MG tablet Take 1 tablet (10 mg total) by mouth daily. 90 tablet 1   Cholecalciferol (VITAMIN D-3) 125 MCG (5000 UT) TABS Take 5,000 Units by mouth daily.     cyclobenzaprine (FLEXERIL) 10 MG tablet Take 1 tablet (10 mg total) by mouth 2 (two) times daily as needed for muscle spasms. 30 tablet 0   esomeprazole (NEXIUM) 40 MG capsule Take 1 capsule (40 mg total) by mouth daily. 30 capsule 5   fluticasone (FLONASE) 50 MCG/ACT nasal spray Place 2 sprays into both nostrils daily. 16 g 6   lisinopril  (ZESTRIL) 20 MG tablet Take 1 tablet (20 mg total) by mouth daily. 90 tablet 3   Multiple Vitamins-Minerals (MULTIVITAMIN WITH MINERALS) tablet Take 1 tablet by mouth 2 (two) times a week.     simvastatin (ZOCOR) 40 MG tablet TAKE (1) TABLET BY MOUTH AT BEDTIME. 30 tablet 0   sodium chloride (OCEAN) 0.65 % SOLN nasal spray Place 1 spray into both nostrils as needed for congestion. 15 mL 0   tamsulosin (FLOMAX) 0.4 MG CAPS capsule Take 1 capsule (0.4 mg total) by mouth daily after supper. 30 capsule 11   No current facility-administered medications for this visit.    Allergies as of 01/29/2023 - Review Complete 12/15/2022  Allergen Reaction Noted   Morphine and related Itching 05/09/2019   Shrimp [shellfish allergy] Swelling 03/06/2020    Family History  Problem Relation Age of Onset   Cancer Father    Cancer Sister    Heart disease Paternal Grandmother     Social History   Socioeconomic History   Marital status: Married    Spouse name: Not on file   Number of children: 3   Years of education: Not on file   Highest education level: Not on file  Occupational History   Occupation: Self-employed- [employer]  Tobacco Use   Smoking status: Never   Smokeless tobacco: Never  Vaping Use   Vaping Use: Never used  Substance and Sexual Activity   Alcohol use: Yes    Comment: 18 beers per weekend   Drug use: Never   Sexual activity: Yes  Other Topics Concern   Not on file  Social History Narrative   Not on file   Social Determinants of Health   Financial Resource Strain: Not on file  Food Insecurity: Not on file  Transportation Needs: Not on file  Physical Activity: Not on file  Stress: Not on file  Social Connections: Not on file  Intimate Partner Violence: Not on file    Review of Systems: Gen: Denies any fever, chills, fatigue, weight loss, lack of appetite.  CV: Denies chest pain, heart palpitations, peripheral edema, syncope.  Resp: Denies shortness of breath at rest or  with exertion. Denies wheezing or cough.  GI: Denies dysphagia or odynophagia. Denies jaundice, hematemesis, fecal incontinence. GU : Denies urinary burning, urinary frequency, urinary hesitancy MS: Denies joint pain, muscle weakness, cramps, or limitation of movement.  Derm: Denies rash, itching, dry skin Psych: Denies depression, anxiety, memory loss, and confusion Heme: Denies bruising, bleeding, and enlarged lymph nodes.  Physical Exam: There were no vitals taken for this visit. General:   Alert and oriented. Pleasant and cooperative. Well-nourished and well-developed.  Head:  Normocephalic and atraumatic. Eyes:  Without icterus, sclera clear and conjunctiva pink.  Ears:  Normal auditory acuity. Lungs:  Clear to auscultation bilaterally. No wheezes, rales, or rhonchi. No distress.  Heart:  S1, S2 present without murmurs appreciated.  Abdomen:  +BS, soft, non-tender and non-distended. No HSM noted. No guarding or rebound. No masses appreciated.  Rectal:  Deferred  Msk:  Symmetrical without gross deformities. Normal posture. Extremities:  Without edema. Neurologic:  Alert and  oriented x4;  grossly normal neurologically. Skin:  Intact without significant lesions or rashes. Psych:  Alert and cooperative. Normal mood and affect.    Assessment:     Plan:  ***   Ermalinda Memos, PA-C [facility] 01/29/2023

## 2023-01-29 ENCOUNTER — Telehealth: Payer: Self-pay | Admitting: *Deleted

## 2023-01-29 ENCOUNTER — Encounter: Payer: Self-pay | Admitting: *Deleted

## 2023-01-29 ENCOUNTER — Other Ambulatory Visit: Payer: Self-pay | Admitting: *Deleted

## 2023-01-29 ENCOUNTER — Ambulatory Visit: Payer: BC Managed Care – PPO | Admitting: Gastroenterology

## 2023-01-29 ENCOUNTER — Encounter: Payer: Self-pay | Admitting: Gastroenterology

## 2023-01-29 VITALS — BP 146/90 | HR 75 | Temp 97.6°F | Ht 75.0 in | Wt 288.6 lb

## 2023-01-29 DIAGNOSIS — R195 Other fecal abnormalities: Secondary | ICD-10-CM

## 2023-01-29 DIAGNOSIS — K219 Gastro-esophageal reflux disease without esophagitis: Secondary | ICD-10-CM | POA: Diagnosis not present

## 2023-01-29 MED ORDER — PEG 3350-KCL-NA BICARB-NACL 420 G PO SOLR: 4000.0000 mL | Freq: Once | ORAL | 0 refills | Status: AC

## 2023-01-29 NOTE — Telephone Encounter (Signed)
[facility] PA: Colonoscopy-The member does not have SOC coverage.  EGD- Order ID: [id number]       Authorized  Approval Valid Through: 01/29/2023 - 03/29/2023

## 2023-01-29 NOTE — Patient Instructions (Signed)
We will arrange to have an upper endoscopy and colonoscopy in the near future with Dr. Jena Gauss.  Will follow-up with you in the office as Dr. Jena Gauss recommends.  Do not hesitate to call back if you have new GI concerns.  It was nice to meet you today!  Ermalinda Memos, PA-C [facility]

## 2023-01-30 ENCOUNTER — Encounter: Payer: Self-pay | Admitting: Gastroenterology

## 2023-02-02 ENCOUNTER — Encounter: Payer: Self-pay | Admitting: *Deleted

## 2023-02-12 ENCOUNTER — Other Ambulatory Visit: Payer: Self-pay | Admitting: Internal Medicine

## 2023-02-12 DIAGNOSIS — E782 Mixed hyperlipidemia: Secondary | ICD-10-CM

## 2023-02-13 ENCOUNTER — Encounter: Payer: Self-pay | Admitting: Urology

## 2023-02-13 ENCOUNTER — Ambulatory Visit: Payer: BC Managed Care – PPO | Admitting: Urology

## 2023-02-13 VITALS — BP 158/92 | HR 77

## 2023-02-13 DIAGNOSIS — R3912 Poor urinary stream: Secondary | ICD-10-CM | POA: Diagnosis not present

## 2023-02-13 DIAGNOSIS — R3911 Hesitancy of micturition: Secondary | ICD-10-CM | POA: Diagnosis not present

## 2023-02-13 DIAGNOSIS — N411 Chronic prostatitis: Secondary | ICD-10-CM

## 2023-02-13 DIAGNOSIS — N401 Enlarged prostate with lower urinary tract symptoms: Secondary | ICD-10-CM | POA: Diagnosis not present

## 2023-02-13 DIAGNOSIS — N400 Enlarged prostate without lower urinary tract symptoms: Secondary | ICD-10-CM

## 2023-02-13 LAB — URINALYSIS, ROUTINE W REFLEX MICROSCOPIC
Bilirubin, UA: NEGATIVE
Glucose, UA: NEGATIVE
Ketones, UA: NEGATIVE
Leukocytes,UA: NEGATIVE
Nitrite, UA: NEGATIVE
Protein,UA: NEGATIVE
RBC, UA: NEGATIVE
Specific Gravity, UA: 1.01 (ref 1.005–1.030)
Urobilinogen, Ur: 1 mg/dL (ref 0.2–1.0)
pH, UA: 7 (ref 5.0–7.5)

## 2023-02-13 MED ORDER — TAMSULOSIN HCL 0.4 MG PO CAPS
0.4000 mg | ORAL_CAPSULE | Freq: Every day | ORAL | 11 refills | Status: DC
Start: 2023-02-13 — End: 2023-03-12

## 2023-02-13 NOTE — Patient Instructions (Signed)

## 2023-02-13 NOTE — Progress Notes (Unsigned)
02/13/2023 10:54 AM   [place] 06/25/1966 [id number]  Referring provider: Anabel Halon, MD [address] [place],  [place] [address]  Followup BPH and chronic prostatitis   HPI: Mr Victor Conner is a 57yo here for followup BPH and chronic prostatitis. He has pelvic pain with initiation of his urinary stream and bowel movement. IPSS 5 QOL 0. He is scheduled for a colonoscopy due to positive hemocult. No straining to urinate. Urine stream strong.    PMH: Past Medical History:  Diagnosis Date   Anxiety    Arthritis    left knee   GERD (gastroesophageal reflux disease)    Hypercholesteremia    Hypertension     Surgical History: Past Surgical History:  Procedure Laterality Date   ANTERIOR CERVICAL DECOMP/DISCECTOMY FUSION N/A 05/09/2019   Procedure: C4-5, C5-6 ANTERIOR CERVICAL DECOMPRESSION/DISCECTOMY FUSION, ALLOGRAFT, PLATE;  Surgeon: Eldred Manges, MD;  Location: [facility];  Service: Orthopedics;  Laterality: N/A;   BONE CYST EXCISION     left leg   INCISION AND DRAINAGE Right    hand   TOTAL KNEE ARTHROPLASTY Left 03/16/2020   Procedure: LEFT TOTAL KNEE ARTHROPLASTY;  Surgeon: Kathryne Hitch, MD;  Location: [facility];  Service: Orthopedics;  Laterality: Left;    Home Medications:  Allergies as of 02/13/2023       Reactions   Morphine And Codeine Itching   Shrimp [shellfish Allergy] Swelling   Eyes/Throat swelling        Medication List        Accurate as of Feb 13, 2023 10:54 AM. If you have any questions, ask your nurse or doctor.          ALPRAZolam 0.5 MG tablet Commonly known as: XANAX Take 1 tablet (0.5 mg total) by mouth 2 (two) times daily as needed for anxiety.   amLODipine 10 MG tablet Commonly known as: NORVASC Take 1 tablet (10 mg total) by mouth daily.   cyclobenzaprine 10 MG tablet Commonly known as: FLEXERIL Take 1 tablet (10 mg total) by mouth 2 (two) times daily as needed for muscle spasms.   esomeprazole 40 MG  capsule Commonly known as: NexIUM Take 1 capsule (40 mg total) by mouth daily.   fluticasone 50 MCG/ACT nasal spray Commonly known as: FLONASE Place 2 sprays into both nostrils daily.   lisinopril 20 MG tablet Commonly known as: ZESTRIL Take 1 tablet (20 mg total) by mouth daily.   multivitamin with minerals tablet Take 1 tablet by mouth 2 (two) times a week.   simvastatin 40 MG tablet Commonly known as: ZOCOR TAKE (1) TABLET BY MOUTH AT BEDTIME.   tamsulosin 0.4 MG Caps capsule Commonly known as: FLOMAX Take 1 capsule (0.4 mg total) by mouth daily after supper.   Vitamin D-3 125 MCG (5000 UT) Tabs Take 5,000 Units by mouth daily.        Allergies:  Allergies  Allergen Reactions   Morphine And Codeine Itching   Shrimp [Shellfish Allergy] Swelling    Eyes/Throat swelling    Family History: Family History  Problem Relation Age of Onset   Cancer Father    Cancer Sister    Heart disease Paternal Grandmother    Colon cancer Neg Hx    Colon polyps Neg Hx     Social History:  reports that he has never smoked. He has never used smokeless tobacco. He reports that he does not currently use alcohol. He reports that he does not use drugs.  ROS:  All other review of systems were reviewed and are negative except what is noted above in HPI  Physical Exam: BP (!) 158/92   Pulse 77   Constitutional:  Alert and oriented, No acute distress. HEENT: [place] AT, moist mucus membranes.  Trachea midline, no masses. Cardiovascular: No clubbing, cyanosis, or edema. Respiratory: Normal respiratory effort, no increased work of breathing. GI: Abdomen is soft, nontender, nondistended, no abdominal masses GU: No CVA tenderness.  Lymph: No cervical or inguinal lymphadenopathy. Skin: No rashes, bruises or suspicious lesions. Neurologic: Grossly intact, no focal deficits, moving all 4 extremities. Psychiatric: Normal mood and affect.  Laboratory Data: Lab Results  Component Value Date    WBC 7.9 12/15/2022   HGB 15.7 12/15/2022   HCT 46.3 12/15/2022   MCV 90 12/15/2022   PLT 204 12/15/2022    Lab Results  Component Value Date   CREATININE 1.18 12/15/2022    No results found for: "PSA"  No results found for: "TESTOSTERONE"  Lab Results  Component Value Date   HGBA1C 5.7 (H) 12/15/2022    Urinalysis    Component Value Date/Time   APPEARANCEUR Clear 08/12/2022 1133   GLUCOSEU Negative 08/12/2022 1133   BILIRUBINUR Negative 08/12/2022 1133   KETONESUR negative 12/20/2021 1043   PROTEINUR Negative 08/12/2022 1133   UROBILINOGEN 0.2 12/20/2021 1043   NITRITE Negative 08/12/2022 1133   LEUKOCYTESUR Negative 08/12/2022 1133    Lab Results  Component Value Date   LABMICR Comment 08/12/2022    Pertinent Imaging:  No results found for this or any previous visit.  No results found for this or any previous visit.  No results found for this or any previous visit.  No results found for this or any previous visit.  No results found for this or any previous visit.  No valid procedures specified. No results found for this or any previous visit.  No results found for this or any previous visit.   Assessment & Plan:    1. Chronic prostatitis Continue flomax 0.4mg  daily - Urinalysis, Routine w reflex microscopic  2. Benign prostatic hyperplasia, unspecified whether lower urinary tract symptoms present Continue flomax 0.4mg  daily  3. Weak urinary stream Continue flomax 0.4mg  daily   No follow-ups on file.  Wilkie Aye, MD  [facility] [place]

## 2023-03-02 ENCOUNTER — Ambulatory Visit (INDEPENDENT_AMBULATORY_CARE_PROVIDER_SITE_OTHER): Payer: BC Managed Care – PPO | Admitting: Internal Medicine

## 2023-03-02 ENCOUNTER — Encounter: Payer: Self-pay | Admitting: Internal Medicine

## 2023-03-02 VITALS — BP 154/82 | HR 89 | Ht 75.0 in | Wt 291.4 lb

## 2023-03-02 DIAGNOSIS — I10 Essential (primary) hypertension: Secondary | ICD-10-CM | POA: Diagnosis not present

## 2023-03-02 DIAGNOSIS — E782 Mixed hyperlipidemia: Secondary | ICD-10-CM

## 2023-03-02 DIAGNOSIS — N401 Enlarged prostate with lower urinary tract symptoms: Secondary | ICD-10-CM | POA: Diagnosis not present

## 2023-03-02 DIAGNOSIS — K219 Gastro-esophageal reflux disease without esophagitis: Secondary | ICD-10-CM | POA: Diagnosis not present

## 2023-03-02 DIAGNOSIS — F410 Panic disorder [episodic paroxysmal anxiety] without agoraphobia: Secondary | ICD-10-CM

## 2023-03-02 DIAGNOSIS — Z0001 Encounter for general adult medical examination with abnormal findings: Secondary | ICD-10-CM

## 2023-03-02 DIAGNOSIS — R3911 Hesitancy of micturition: Secondary | ICD-10-CM

## 2023-03-02 MED ORDER — ALPRAZOLAM 0.5 MG PO TABS
0.5000 mg | ORAL_TABLET | Freq: Two times a day (BID) | ORAL | 0 refills | Status: DC | PRN
Start: 2023-03-02 — End: 2024-05-09

## 2023-03-02 NOTE — Assessment & Plan Note (Signed)
BP Readings from Last 1 Encounters:  03/02/23 (!) 154/82    Well-controlled at home with amlodipine 5 mg QD and lisinopril 20 mg QD Likely has component of whitecoat hypertension Counseled for compliance with the medications Advised DASH diet and moderate exercise/walking, at least 150 mins/week

## 2023-03-02 NOTE — Assessment & Plan Note (Addendum)
On simvastatin Checked lipid profile 

## 2023-03-02 NOTE — Assessment & Plan Note (Signed)

## 2023-03-02 NOTE — Patient Instructions (Addendum)
Please continue to take medications as prescribed.  Please continue to follow low salt diet and perform moderate exercise/walking at least 150 mins/week. 

## 2023-03-02 NOTE — Assessment & Plan Note (Addendum)
Was uncontrolled with Protonix Switched to Nexium 40 mg daily, now better Advised to take Pepcid at nighttime as needed Avoid caffeine related products and take dinner at least 2 hours prior to bedtime Followed by GI - planned for EGD

## 2023-03-02 NOTE — Assessment & Plan Note (Signed)
Has had episodes of panic in the past in crowd and especially while in plane Usually better with Xanax as needed -his recent episode of flushing and dizziness could be from panic 

## 2023-03-02 NOTE — Progress Notes (Signed)
Established Patient Office Visit  Subjective:  Patient ID: Victor Conner, male    DOB: Jun 27, 1966  Age: 57 y.o. MRN: [id number]  CC:  Chief Complaint  Patient presents with   Annual Exam    Patient is experiencing panic attacks    HPI Victor Conner is a 57 y.o. male with past medical history of HTN, GERD, HLD, cervical spinal stenosis and panic disorder who presents for annual physical.  HTN: BP is elevated today. His blood pressure has been wnl at home - around 130s/70s. Takes medications regularly. Patient denies headache, dizziness, chest pain, dyspnea or palpitations.  GERD: He is currently taking Nexium 40 mg QD currently.  Denies any nausea or vomiting currently.  Denies any melena or hematochezia currently.  He is planned to get EGD.  He is good going to get colonoscopy for positive Cologuard test.  Panic disorder: He has had episodes of panic in crowded and while flying.  He feels better with Xanax.  Denies anhedonia, insomnia, SI or HI currently.      Past Medical History:  Diagnosis Date   Anxiety    Arthritis    left knee   GERD (gastroesophageal reflux disease)    Hypercholesteremia    Hypertension     Past Surgical History:  Procedure Laterality Date   ANTERIOR CERVICAL DECOMP/DISCECTOMY FUSION N/A 05/09/2019   Procedure: C4-5, C5-6 ANTERIOR CERVICAL DECOMPRESSION/DISCECTOMY FUSION, ALLOGRAFT, PLATE;  Surgeon: Eldred Manges, MD;  Location: [facility] OR;  Service: Orthopedics;  Laterality: N/A;   BONE CYST EXCISION     left leg   INCISION AND DRAINAGE Right    hand   TOTAL KNEE ARTHROPLASTY Left 03/16/2020   Procedure: LEFT TOTAL KNEE ARTHROPLASTY;  Surgeon: Kathryne Hitch, MD;  Location: [facility];  Service: Orthopedics;  Laterality: Left;    Family History  Problem Relation Age of Onset   Cancer Father    Cancer Sister    Heart disease Paternal Grandmother    Colon cancer Neg Hx    Colon polyps Neg Hx     Social History   Socioeconomic  History   Marital status: Married    Spouse name: Not on file   Number of children: 3   Years of education: Not on file   Highest education level: Not on file  Occupational History   Occupation: Self-employed- [employer]  Tobacco Use   Smoking status: Never   Smokeless tobacco: Never  Vaping Use   Vaping Use: Never used  Substance and Sexual Activity   Alcohol use: Not Currently    Comment: 12+ beer over a couple days on the weekend. May also drink during the week.   Drug use: Never   Sexual activity: Yes  Other Topics Concern   Not on file  Social History Narrative   Not on file   Social Determinants of Health   Financial Resource Strain: Not on file  Food Insecurity: Not on file  Transportation Needs: Not on file  Physical Activity: Not on file  Stress: Not on file  Social Connections: Not on file  Intimate Partner Violence: Not on file    Outpatient Medications Prior to Visit  Medication Sig Dispense Refill   amLODipine (NORVASC) 10 MG tablet Take 1 tablet (10 mg total) by mouth daily. 90 tablet 1   Cholecalciferol (VITAMIN D-3) 125 MCG (5000 UT) TABS Take 5,000 Units by mouth daily.     cyclobenzaprine (FLEXERIL) 10 MG tablet Take 1 tablet (10 mg total)  by mouth 2 (two) times daily as needed for muscle spasms. 30 tablet 0   esomeprazole (NEXIUM) 40 MG capsule Take 1 capsule (40 mg total) by mouth daily. 30 capsule 5   fluticasone (FLONASE) 50 MCG/ACT nasal spray Place 2 sprays into both nostrils daily. 16 g 6   lisinopril (ZESTRIL) 20 MG tablet Take 1 tablet (20 mg total) by mouth daily. 90 tablet 3   Multiple Vitamins-Minerals (MULTIVITAMIN WITH MINERALS) tablet Take 1 tablet by mouth 2 (two) times a week.     simvastatin (ZOCOR) 40 MG tablet TAKE (1) TABLET BY MOUTH AT BEDTIME. 30 tablet 0   tamsulosin (FLOMAX) 0.4 MG CAPS capsule Take 1 capsule (0.4 mg total) by mouth daily after supper. 30 capsule 11   ALPRAZolam (XANAX) 0.5 MG tablet Take 1 tablet (0.5 mg total) by  mouth 2 (two) times daily as needed for anxiety. 30 tablet 0   No facility-administered medications prior to visit.    Allergies  Allergen Reactions   Morphine And Codeine Itching   Shrimp [Shellfish Allergy] Swelling    Eyes/Throat swelling    ROS Review of Systems  Constitutional:  Negative for chills and fever.  HENT:  Negative for congestion and sore throat.   Eyes:  Negative for pain and discharge.  Respiratory:  Negative for cough and shortness of breath.   Cardiovascular:  Negative for chest pain and palpitations.  Gastrointestinal:  Negative for diarrhea, nausea and vomiting.  Endocrine: Negative for polydipsia and polyuria.  Genitourinary:  Negative for dysuria and hematuria.       Groin pain  Musculoskeletal:  Positive for back pain. Negative for arthralgias, neck pain and neck stiffness.  Skin:  Negative for rash.  Neurological:  Negative for dizziness, weakness, numbness and headaches.  Psychiatric/Behavioral:  Negative for agitation and behavioral problems.       Objective:    Physical Exam Vitals reviewed.  Constitutional:      General: He is not in acute distress.    Appearance: He is obese. He is not diaphoretic.  HENT:     Head: Normocephalic and atraumatic.     Nose: Nose normal.     Mouth/Throat:     Mouth: Mucous membranes are moist.  Eyes:     General: No scleral icterus.    Extraocular Movements: Extraocular movements intact.  Cardiovascular:     Rate and Rhythm: Normal rate and regular rhythm.     Heart sounds: No murmur heard. Pulmonary:     Breath sounds: Normal breath sounds. No wheezing or rales.  Abdominal:     Palpations: Abdomen is soft.     Tenderness: There is no abdominal tenderness.  Musculoskeletal:     Cervical back: Neck supple. No tenderness.     Lumbar back: Tenderness present.     Right lower leg: No edema.     Left lower leg: No edema.  Skin:    General: Skin is warm.     Findings: No rash.  Neurological:      General: No focal deficit present.     Mental Status: He is alert and oriented to person, place, and time.     Sensory: No sensory deficit.     Motor: No weakness.  Psychiatric:        Mood and Affect: Mood normal.        Behavior: Behavior normal.     BP (!) 154/82 (BP Location: Left Arm)   Pulse 89   Ht 6\' 3"  (1.905  m)   Wt 291 lb 6.4 oz (132.2 kg)   SpO2 95%   BMI 36.42 kg/m  Wt Readings from Last 3 Encounters:  03/02/23 291 lb 6.4 oz (132.2 kg)  01/29/23 288 lb 9.6 oz (130.9 kg)  12/15/22 285 lb 9.6 oz (129.5 kg)    Lab Results  Component Value Date   TSH 0.898 12/15/2022   Lab Results  Component Value Date   WBC 7.9 12/15/2022   HGB 15.7 12/15/2022   HCT 46.3 12/15/2022   MCV 90 12/15/2022   PLT 204 12/15/2022   Lab Results  Component Value Date   NA 140 12/15/2022   K 5.2 12/15/2022   CO2 23 12/15/2022   GLUCOSE 105 (H) 12/15/2022   BUN 21 12/15/2022   CREATININE 1.18 12/15/2022   BILITOT 0.3 12/15/2022   ALKPHOS 68 12/15/2022   AST 24 12/15/2022   ALT 23 12/15/2022   PROT 7.3 12/15/2022   ALBUMIN 4.6 12/15/2022   CALCIUM 10.0 12/15/2022   ANIONGAP 9 03/09/2020   EGFR 72 12/15/2022   Lab Results  Component Value Date   CHOL 181 12/15/2022   Lab Results  Component Value Date   HDL 66 12/15/2022   Lab Results  Component Value Date   LDLCALC 102 (H) 12/15/2022   Lab Results  Component Value Date   TRIG 70 12/15/2022   Lab Results  Component Value Date   CHOLHDL 2.7 12/15/2022   Lab Results  Component Value Date   HGBA1C 5.7 (H) 12/15/2022      Assessment & Plan:   Problem List Items Addressed This Visit       Cardiovascular and Mediastinum   HTN (hypertension)    BP Readings from Last 1 Encounters:  03/02/23 (!) 154/82   Well-controlled at home with amlodipine 5 mg QD and lisinopril 20 mg QD Likely has component of whitecoat hypertension Counseled for compliance with the medications Advised DASH diet and moderate  exercise/walking, at least 150 mins/week        Digestive   GERD (gastroesophageal reflux disease)    Was uncontrolled with Protonix Switched to Nexium 40 mg daily, now better Advised to take Pepcid at nighttime as needed Avoid caffeine related products and take dinner at least 2 hours prior to bedtime Followed by GI - planned for EGD        Genitourinary   Benign prostatic hyperplasia with urinary hesitancy    On Flomax Checked PSA Followed by urology        Other   Encounter for general adult medical examination with abnormal findings - Primary    Physical exam as documented. Counseling done  re healthy lifestyle involving commitment to 150 minutes exercise per week, heart healthy diet, and attaining healthy weight.The importance of adequate sleep also discussed. Changes in health habits are decided on by the patient with goals and time frames  set for achieving them. Immunization and cancer screening needs are specifically addressed at this visit.      Panic disorder    Has had episodes of panic in the past in crowd and especially while in plane Usually better with Xanax as needed -his recent episode of flushing and dizziness could be from panic      Relevant Medications   ALPRAZolam (XANAX) 0.5 MG tablet   HLD (hyperlipidemia)    On simvastatin Checked lipid profile       Meds ordered this encounter  Medications   ALPRAZolam (XANAX) 0.5 MG tablet  Sig: Take 1 tablet (0.5 mg total) by mouth 2 (two) times daily as needed for anxiety.    Dispense:  30 tablet    Refill:  0    Follow-up: Return in about 2 months (around 05/02/2023) for HTN.    Anabel Halon, MD

## 2023-03-02 NOTE — Assessment & Plan Note (Signed)
On Flomax Checked PSA Followed by urology

## 2023-03-12 ENCOUNTER — Other Ambulatory Visit: Payer: Self-pay

## 2023-03-12 DIAGNOSIS — N401 Enlarged prostate with lower urinary tract symptoms: Secondary | ICD-10-CM

## 2023-03-12 MED ORDER — TAMSULOSIN HCL 0.4 MG PO CAPS
0.4000 mg | ORAL_CAPSULE | Freq: Every day | ORAL | 11 refills | Status: DC
Start: 2023-03-12 — End: 2024-06-14

## 2023-03-16 NOTE — Patient Instructions (Addendum)
Your procedure is scheduled on: 03/20/2023  Report to [facility] Main Entrance at  6:00   AM.  Call this number if you have problems the morning of surgery: [phone]   Remember:              Follow Directions on the letter you received from Your Physician's office regarding the Bowel Prep              No Smoking the day of Procedure :   Take these medicines the morning of surgery with A SIP OF WATER: Amlodipine, Nexium, Xanax, and flomax   Do not wear jewelry, make-up or nail polish.    Do not bring valuables to the hospital.  Contacts, dentures or bridgework may not be worn into surgery.  .   Patients discharged the day of surgery will not be allowed to drive home.     Colonoscopy, Adult, Care After This sheet gives you information about how to care for yourself after your procedure. Your health care provider may also give you more specific instructions. If you have problems or questions, contact your health care provider. What can I expect after the procedure? After the procedure, it is common to have: A small amount of blood in your stool for 24 hours after the procedure. Some gas. Mild abdominal cramping or bloating.  Follow these instructions at home: General instructions  For the first 24 hours after the procedure: Do not drive or use machinery. Do not sign important documents. Do not drink alcohol. Do your regular daily activities at a slower pace than normal. Eat soft, easy-to-digest foods. Rest often. Take over-the-counter or prescription medicines only as told by your health care provider. It is up to you to get the results of your procedure. Ask your health care provider, or the department performing the procedure, when your results will be ready. Relieving cramping and bloating Try walking around when you have cramps or feel bloated. Apply heat to your abdomen as told by your health care provider. Use a heat source that your health care provider  recommends, such as a moist heat pack or a heating pad. Place a towel between your skin and the heat source. Leave the heat on for 20-30 minutes. Remove the heat if your skin turns bright red. This is especially important if you are unable to feel pain, heat, or cold. You may have a greater risk of getting burned. Eating and drinking Drink enough fluid to keep your urine clear or pale yellow. Resume your normal diet as instructed by your health care provider. Avoid heavy or fried foods that are hard to digest. Avoid drinking alcohol for as long as instructed by your health care provider. Contact a health care provider if: You have blood in your stool 2-3 days after the procedure. Get help right away if: You have more than a small spotting of blood in your stool. You pass large blood clots in your stool. Your abdomen is swollen. You have nausea or vomiting. You have a fever. You have increasing abdominal pain that is not relieved with medicine. This information is not intended to replace advice given to you by your health care provider. Make sure you discuss any questions you have with your health care provider. Document Released: 04/22/2004 Document Revised: 06/02/2016 Document Reviewed: 11/20/2015 Elsevier Interactive Patient Education  2018 [employer] Upper Endoscopy, Adult, Care After After the procedure, it is common to have a sore throat. It is also common to have:  Mild stomach pain or discomfort. Bloating. Nausea. Follow these instructions at home: The instructions below may help you care for yourself at home. Your health care provider may give you more instructions. If you have questions, ask your health care provider. If you were given a sedative during the procedure, it can affect you for several hours. Do not drive or operate machinery until your health care provider says that it is safe. If you will be going home right after the procedure, plan to have a responsible  adult: Take you home from the hospital or clinic. You will not be allowed to drive. Care for you for the time you are told. Follow instructions from your health care provider about what you may eat and drink. Return to your normal activities as told by your health care provider. Ask your health care provider what activities are safe for you. Take over-the-counter and prescription medicines only as told by your health care provider. Contact a health care provider if you: Have a sore throat that lasts longer than one day. Have trouble swallowing. Have a fever. Get help right away if you: Vomit blood or your vomit looks like coffee grounds. Have bloody, black, or tarry stools. Have a very bad sore throat or you cannot swallow. Have difficulty breathing or very bad pain in your chest or abdomen. These symptoms may be an emergency. Get help right away. Call 911. Do not wait to see if the symptoms will go away. Do not drive yourself to the hospital. Summary After the procedure, it is common to have a sore throat, mild stomach discomfort, bloating, and nausea. If you were given a sedative during the procedure, it can affect you for several hours. Do not drive until your health care provider says that it is safe. Follow instructions from your health care provider about what you may eat and drink. Return to your normal activities as told by your health care provider. This information is not intended to replace advice given to you by your health care provider. Make sure you discuss any questions you have with your health care provider. Document Revised: 12/18/2021 Document Reviewed: 12/18/2021 Elsevier Patient Education  2024 [employer].

## 2023-03-18 ENCOUNTER — Telehealth: Payer: Self-pay | Admitting: *Deleted

## 2023-03-18 ENCOUNTER — Encounter: Payer: Self-pay | Admitting: *Deleted

## 2023-03-18 ENCOUNTER — Encounter (HOSPITAL_COMMUNITY)
Admission: RE | Admit: 2023-03-18 | Discharge: 2023-03-18 | Disposition: A | Discharge status: Home / Self Care | Payer: BC Managed Care – PPO | Source: Ambulatory Visit | Attending: Internal Medicine | Admitting: Internal Medicine

## 2023-03-18 DIAGNOSIS — Z01818 Encounter for other preprocedural examination: Secondary | ICD-10-CM

## 2023-03-18 NOTE — Telephone Encounter (Signed)
Pt says he hasn't checked the mail. He says his wife usually does it and she travels for work and hasn't got around to it. Wants to see if pre-op can be done tomorrow because he has already paid for his procedure.  Pt instructed to be at [facility] tomorrow 03/19/23 at 8:00 am for his pre-op appointment. I will also send his instructions to his MyChart. Verbalized understanding.

## 2023-03-18 NOTE — Telephone Encounter (Signed)
LMTRC

## 2023-03-18 NOTE — Telephone Encounter (Signed)
Myra Rude, RN  Mr Enck was a NO SHOW for PAT and EKG at 0830.

## 2023-03-19 ENCOUNTER — Encounter (HOSPITAL_COMMUNITY)
Admission: RE | Admit: 2023-03-19 | Discharge: 2023-03-19 | Disposition: A | Discharge status: Home / Self Care | Payer: BC Managed Care – PPO | Source: Ambulatory Visit | Attending: Internal Medicine | Admitting: Internal Medicine

## 2023-03-19 ENCOUNTER — Other Ambulatory Visit: Payer: Self-pay

## 2023-03-19 DIAGNOSIS — Z1211 Encounter for screening for malignant neoplasm of colon: Secondary | ICD-10-CM | POA: Diagnosis not present

## 2023-03-19 DIAGNOSIS — I10 Essential (primary) hypertension: Secondary | ICD-10-CM | POA: Diagnosis not present

## 2023-03-19 DIAGNOSIS — F419 Anxiety disorder, unspecified: Secondary | ICD-10-CM | POA: Diagnosis not present

## 2023-03-19 DIAGNOSIS — R195 Other fecal abnormalities: Secondary | ICD-10-CM | POA: Diagnosis not present

## 2023-03-19 DIAGNOSIS — Z01810 Encounter for preprocedural cardiovascular examination: Secondary | ICD-10-CM | POA: Insufficient documentation

## 2023-03-19 DIAGNOSIS — K219 Gastro-esophageal reflux disease without esophagitis: Secondary | ICD-10-CM | POA: Diagnosis not present

## 2023-03-19 DIAGNOSIS — K31A0 Gastric intestinal metaplasia, unspecified: Secondary | ICD-10-CM | POA: Diagnosis not present

## 2023-03-19 DIAGNOSIS — K3189 Other diseases of stomach and duodenum: Secondary | ICD-10-CM | POA: Diagnosis not present

## 2023-03-19 DIAGNOSIS — Z01818 Encounter for other preprocedural examination: Secondary | ICD-10-CM

## 2023-03-19 DIAGNOSIS — K640 First degree hemorrhoids: Secondary | ICD-10-CM | POA: Diagnosis not present

## 2023-03-19 DIAGNOSIS — G709 Myoneural disorder, unspecified: Secondary | ICD-10-CM | POA: Diagnosis not present

## 2023-03-19 DIAGNOSIS — R12 Heartburn: Secondary | ICD-10-CM | POA: Diagnosis not present

## 2023-03-19 DIAGNOSIS — E7800 Pure hypercholesterolemia, unspecified: Secondary | ICD-10-CM | POA: Diagnosis not present

## 2023-03-20 ENCOUNTER — Ambulatory Visit (HOSPITAL_COMMUNITY): Payer: BC Managed Care – PPO | Admitting: Certified Registered"

## 2023-03-20 ENCOUNTER — Encounter (HOSPITAL_COMMUNITY): Payer: Self-pay | Admitting: Internal Medicine

## 2023-03-20 ENCOUNTER — Encounter (HOSPITAL_COMMUNITY)
Admission: RE | Disposition: A | Discharge status: Home / Self Care | Payer: Self-pay | Source: Home / Self Care | Attending: Internal Medicine

## 2023-03-20 ENCOUNTER — Ambulatory Visit (HOSPITAL_COMMUNITY)
Admission: RE | Admit: 2023-03-20 | Discharge: 2023-03-20 | Disposition: A | Discharge status: Home / Self Care | Payer: BC Managed Care – PPO | Attending: Internal Medicine | Admitting: Internal Medicine

## 2023-03-20 DIAGNOSIS — Z1211 Encounter for screening for malignant neoplasm of colon: Secondary | ICD-10-CM | POA: Insufficient documentation

## 2023-03-20 DIAGNOSIS — K219 Gastro-esophageal reflux disease without esophagitis: Secondary | ICD-10-CM | POA: Insufficient documentation

## 2023-03-20 DIAGNOSIS — F419 Anxiety disorder, unspecified: Secondary | ICD-10-CM | POA: Diagnosis not present

## 2023-03-20 DIAGNOSIS — K640 First degree hemorrhoids: Secondary | ICD-10-CM | POA: Insufficient documentation

## 2023-03-20 DIAGNOSIS — G709 Myoneural disorder, unspecified: Secondary | ICD-10-CM | POA: Insufficient documentation

## 2023-03-20 DIAGNOSIS — I10 Essential (primary) hypertension: Secondary | ICD-10-CM | POA: Diagnosis not present

## 2023-03-20 DIAGNOSIS — E7800 Pure hypercholesterolemia, unspecified: Secondary | ICD-10-CM | POA: Insufficient documentation

## 2023-03-20 DIAGNOSIS — K3189 Other diseases of stomach and duodenum: Secondary | ICD-10-CM

## 2023-03-20 DIAGNOSIS — R195 Other fecal abnormalities: Secondary | ICD-10-CM | POA: Insufficient documentation

## 2023-03-20 DIAGNOSIS — R12 Heartburn: Secondary | ICD-10-CM | POA: Insufficient documentation

## 2023-03-20 DIAGNOSIS — K31A0 Gastric intestinal metaplasia, unspecified: Secondary | ICD-10-CM | POA: Insufficient documentation

## 2023-03-20 HISTORY — PX: ESOPHAGOGASTRODUODENOSCOPY (EGD) WITH PROPOFOL: SHX5813

## 2023-03-20 HISTORY — PX: COLONOSCOPY WITH PROPOFOL: SHX5780

## 2023-03-20 SURGERY — COLONOSCOPY WITH PROPOFOL
Anesthesia: General

## 2023-03-20 MED ORDER — PROPOFOL 10 MG/ML IV BOLUS
INTRAVENOUS | Status: DC | PRN
  Administered 2023-03-20: 120 mg via INTRAVENOUS

## 2023-03-20 MED ORDER — LIDOCAINE HCL (PF) 2 % IJ SOLN
INTRAMUSCULAR | Status: AC
  Filled 2023-03-20: qty 20

## 2023-03-20 MED ORDER — PROPOFOL 500 MG/50ML IV EMUL
INTRAVENOUS | Status: DC | PRN
  Administered 2023-03-20: 150 ug/kg/min via INTRAVENOUS

## 2023-03-20 MED ORDER — EPHEDRINE SULFATE-NACL 50-0.9 MG/10ML-% IV SOSY
PREFILLED_SYRINGE | INTRAVENOUS | Status: DC | PRN
  Administered 2023-03-20 (×3): 5 mg via INTRAVENOUS
  Administered 2023-03-20: 10 mg via INTRAVENOUS

## 2023-03-20 MED ORDER — EPHEDRINE 5 MG/ML INJ
INTRAVENOUS | Status: AC
  Filled 2023-03-20: qty 5

## 2023-03-20 MED ORDER — LIDOCAINE HCL (CARDIAC) PF 100 MG/5ML IV SOSY
PREFILLED_SYRINGE | INTRAVENOUS | Status: DC | PRN
  Administered 2023-03-20: 80 mg via INTRAVENOUS

## 2023-03-20 MED ORDER — PROPOFOL 1000 MG/100ML IV EMUL
INTRAVENOUS | Status: AC
  Filled 2023-03-20: qty 100

## 2023-03-20 MED ORDER — PHENYLEPHRINE 80 MCG/ML (10ML) SYRINGE FOR IV PUSH (FOR BLOOD PRESSURE SUPPORT)
PREFILLED_SYRINGE | INTRAVENOUS | Status: DC | PRN
  Administered 2023-03-20: 80 ug via INTRAVENOUS
  Administered 2023-03-20 (×4): 160 ug via INTRAVENOUS
  Administered 2023-03-20: 80 ug via INTRAVENOUS

## 2023-03-20 MED ORDER — PHENYLEPHRINE 80 MCG/ML (10ML) SYRINGE FOR IV PUSH (FOR BLOOD PRESSURE SUPPORT)
PREFILLED_SYRINGE | INTRAVENOUS | Status: AC
  Filled 2023-03-20: qty 10

## 2023-03-20 MED ORDER — LACTATED RINGERS IV SOLN: INTRAVENOUS | Status: DC

## 2023-03-20 NOTE — Discharge Instructions (Signed)
EGD Discharge instructions Please read the instructions outlined below and refer to this sheet in the next few weeks. These discharge instructions provide you with general information on caring for yourself after you leave the hospital. Your doctor may also give you specific instructions. While your treatment has been planned according to the most current medical practices available, unavoidable complications occasionally occur. If you have any problems or questions after discharge, please call your doctor. ACTIVITY You may resume your regular activity but move at a slower pace for the next 24 hours.  Take frequent rest periods for the next 24 hours.  Walking will help expel (get rid of) the air and reduce the bloated feeling in your abdomen.  No driving for 24 hours (because of the anesthesia (medicine) used during the test).  You may shower.  Do not sign any important legal documents or operate any machinery for 24 hours (because of the anesthesia used during the test).  NUTRITION Drink plenty of fluids.  You may resume your normal diet.  Begin with a light meal and progress to your normal diet.  Avoid alcoholic beverages for 24 hours or as instructed by your caregiver.  MEDICATIONS You may resume your normal medications unless your caregiver tells you otherwise.  WHAT YOU CAN EXPECT TODAY You may experience abdominal discomfort such as a feeling of fullness or "gas" pains.  FOLLOW-UP Your doctor will discuss the results of your test with you.  SEEK IMMEDIATE MEDICAL ATTENTION IF ANY OF THE FOLLOWING OCCUR: Excessive nausea (feeling sick to your stomach) and/or vomiting.  Severe abdominal pain and distention (swelling).  Trouble swallowing.  Temperature over 101 F (37.8 C).  Rectal bleeding or vomiting of blood.    Colonoscopy Discharge Instructions  Read the instructions outlined below and refer to this sheet in the next few weeks. These discharge instructions provide you with  general information on caring for yourself after you leave the hospital. Your doctor may also give you specific instructions. While your treatment has been planned according to the most current medical practices available, unavoidable complications occasionally occur. If you have any problems or questions after discharge, call Dr. Gala Romney at [phone]. ACTIVITY You may resume your regular activity, but move at a slower pace for the next 24 hours.  Take frequent rest periods for the next 24 hours.  Walking will help get rid of the air and reduce the bloated feeling in your belly (abdomen).  No driving for 24 hours (because of the medicine (anesthesia) used during the test).   Do not sign any important legal documents or operate any machinery for 24 hours (because of the anesthesia used during the test).  NUTRITION Drink plenty of fluids.  You may resume your normal diet as instructed by your doctor.  Begin with a light meal and progress to your normal diet. Heavy or fried foods are harder to digest and may make you feel sick to your stomach (nauseated).  Avoid alcoholic beverages for 24 hours or as instructed.  MEDICATIONS You may resume your normal medications unless your doctor tells you otherwise.  WHAT YOU CAN EXPECT TODAY Some feelings of bloating in the abdomen.  Passage of more gas than usual.  Spotting of blood in your stool or on the toilet paper.  IF YOU HAD POLYPS REMOVED DURING THE COLONOSCOPY: No aspirin products for 7 days or as instructed.  No alcohol for 7 days or as instructed.  Eat a soft diet for the next 24 hours.  FINDING  OUT THE RESULTS OF YOUR TEST Not all test results are available during your visit. If your test results are not back during the visit, make an appointment with your caregiver to find out the results. Do not assume everything is normal if you have not heard from your caregiver or the medical facility. It is important for you to follow up on all of your test  results.  SEEK IMMEDIATE MEDICAL ATTENTION IF: You have more than a spotting of blood in your stool.  Your belly is swollen (abdominal distention).  You are nauseated or vomiting.  You have a temperature over 101.  You have abdominal pain or discomfort that is severe or gets worse throughout the day.       There was no damage in your esophagus.  You had mild inflammation in your stomach.  Biopsies taken.      Your colonoscopy was normal!    In this setting, your Cologuard test was a false positive.    GERD information provided     repeat colonoscopy for screening purposes in  10 years      office visit in 3 months   at patient request, I called Sampson Lyndaker at [phone] answer

## 2023-03-20 NOTE — Op Note (Signed)
[facility] Patient Name: Victor Conner Procedure Date: 03/20/2023 7:00 AM MRN: [id number] Date of Birth: 1965/12/22 Attending MD: Gennette Pac , MD, [id number] CSN: [id number] Age: 57 Admit Type: Outpatient Procedure:                Upper GI endoscopy Indications:              Heartburn Providers:                Gennette Pac, MD, Nena Polio, RN, Pandora Leiter, Technician Referring MD:             Gennette Pac, MD Medicines:                Propofol per Anesthesia Complications:            No immediate complications. Estimated Blood Loss:     Estimated blood loss was minimal. Procedure:                Pre-Anesthesia Assessment:                           - Prior to the procedure, a History and Physical                            was performed, and patient medications and                            allergies were reviewed. The patient's tolerance of                            previous anesthesia was also reviewed. The risks                            and benefits of the procedure and the sedation                            options and risks were discussed with the patient.                            All questions were answered, and informed consent                            was obtained. Prior Anticoagulants: The patient has                            taken no anticoagulant or antiplatelet agents. ASA                            Grade Assessment: III - A patient with severe                            systemic disease. After reviewing the risks and  benefits, the patient was deemed in satisfactory                            condition to undergo the procedure.                           After obtaining informed consent, the endoscope was                            passed under direct vision. Throughout the                            procedure, the patient's blood pressure, pulse, and                             oxygen saturations were monitored continuously. The                            [id number] ([id number]) scope was introduced through the                            mouth, and advanced to the second part of duodenum.                            The upper GI endoscopy was accomplished without                            difficulty. The patient tolerated the procedure                            well. Scope In: 7:40:46 AM Scope Out: 7:45:46 AM Total Procedure Duration: 0 hours 5 minutes 0 seconds  Findings:      The examined esophagus was normal.      Multiple erosions with no stigmata of recent bleeding were found in the       gastric antrum. Biopsies were taken with a cold forceps for histology.       Estimated blood loss was minimal.      The duodenal bulb and second portion of the duodenum were normal. Impression:               - Normal esophagus.                           - Erosive gastropathy with no stigmata of recent                            bleeding. Biopsied.                           - Normal duodenal bulb and second portion of the                            duodenum. Moderate Sedation:      Moderate (conscious) sedation was personally administered by an       anesthesia professional. The following parameters were monitored:  oxygen       saturation, heart rate, blood pressure, respiratory rate, EKG, adequacy       of pulmonary ventilation, and response to care. Recommendation:           - Patient has a contact number available for                            emergencies. The signs and symptoms of potential                            delayed complications were discussed with the                            patient. Return to normal activities tomorrow.                            Written discharge instructions were provided to the                            patient.                           - Advance diet as tolerated.                           - Continue present medications. GERD  information                            provided. Follow-up on pathology.                           - Return to my office in 3 months. Procedure Code(s):        --- Professional ---                           [phone], Esophagogastroduodenoscopy, flexible,                            transoral; with biopsy, single or multiple Diagnosis Code(s):        --- Professional ---                           K31.89, Other diseases of stomach and duodenum                           R12, Heartburn CPT copyright 2022 American Medical Association. All rights reserved. The codes documented in this report are preliminary and upon coder review may  be revised to meet current compliance requirements. Gerrit Friends. Raylan Hanton, MD Gennette Pac, MD 03/20/2023 8:06:16 AM This report has been signed electronically. Number of Addenda: 0

## 2023-03-20 NOTE — Op Note (Signed)
[facility] Patient Name: Victor Conner Procedure Date: 03/20/2023 6:59 AM MRN: [id number] Date of Birth: September 21, 1966 Attending MD: Gennette Pac , MD, [id number] CSN: [id number] Age: 57 Admit Type: Outpatient Procedure:                Colonoscopy Indications:              Positive Cologuard test Providers:                Gennette Pac, MD, Nena Polio, RN, Pandora Leiter, Technician Referring MD:             Gennette Pac, MD Medicines:                Propofol per Anesthesia Complications:            No immediate complications. Estimated Blood Loss:     Estimated blood loss: none. Procedure:                Pre-Anesthesia Assessment:                           - Prior to the procedure, a History and Physical                            was performed, and patient medications and                            allergies were reviewed. The patient's tolerance of                            previous anesthesia was also reviewed. The risks                            and benefits of the procedure and the sedation                            options and risks were discussed with the patient.                            All questions were answered, and informed consent                            was obtained. Prior Anticoagulants: The patient has                            taken no anticoagulant or antiplatelet agents. ASA                            Grade Assessment: III - A patient with severe                            systemic disease. After reviewing the risks and  benefits, the patient was deemed in satisfactory                            condition to undergo the procedure.                           After obtaining informed consent, the colonoscope                            was passed under direct vision. Throughout the                            procedure, the patient's blood pressure, pulse, and                             oxygen saturations were monitored continuously. The                            [id number]) scope was introduced through                            the anus and advanced to the the cecum, identified                            by appendiceal orifice and ileocecal valve. The                            colonoscopy was performed without difficulty. The                            patient tolerated the procedure well. The quality                            of the bowel preparation was adequate. The                            ileocecal valve, appendiceal orifice, and rectum                            were photographed. Scope In: 7:52:31 AM Scope Out: 8:04:58 AM Total Procedure Duration: 0 hours 12 minutes 27 seconds  Findings:      The perianal and digital rectal examinations were normal.      The colon (entire examined portion) appeared normal. The ascending colon       was examined twice.      Non-bleeding internal hemorrhoids were found during retroflexion. The       hemorrhoids were mild, small and Grade I (internal hemorrhoids that do       not prolapse). Impression:               - The entire examined colon is normal.                           - Non-bleeding internal hemorrhoids.                           -  No specimens collected. Moderate Sedation:      Moderate (conscious) sedation was personally administered by an       anesthesia professional. The following parameters were monitored: oxygen       saturation, heart rate, blood pressure, respiratory rate, EKG, adequacy       of pulmonary ventilation, and response to care. Recommendation:           - Repeat colonoscopy in 10 years for screening                            purposes.                           - Return to GI office in 3 months. See EGD report. Procedure Code(s):        --- Professional ---                           [phone], Colonoscopy, flexible; diagnostic, including                            collection of  specimen(s) by brushing or washing,                            when performed (separate procedure) Diagnosis Code(s):        --- Professional ---                           K64.0, First degree hemorrhoids                           R19.5, Other fecal abnormalities CPT copyright 2022 American Medical Association. All rights reserved. The codes documented in this report are preliminary and upon coder review may  be revised to meet current compliance requirements. Gerrit Friends. Jonisha Kindig, MD Gennette Pac, MD 03/20/2023 8:10:06 AM This report has been signed electronically. Number of Addenda: 0

## 2023-03-20 NOTE — Anesthesia Preprocedure Evaluation (Signed)
Anesthesia Evaluation  Patient identified by MRN, date of birth, ID band Patient awake    Reviewed: Allergy & Precautions, H&P , NPO status , Patient's Chart, lab work & pertinent test results, reviewed documented beta blocker date and time   Airway Mallampati: II  TM Distance: >3 FB Neck ROM: full    Dental no notable dental hx.    Pulmonary neg pulmonary ROS   Pulmonary exam normal breath sounds clear to auscultation       Cardiovascular Exercise Tolerance: Good hypertension, negative cardio ROS  Rhythm:regular Rate:Normal     Neuro/Psych  PSYCHIATRIC DISORDERS Anxiety      Neuromuscular disease negative neurological ROS  negative psych ROS   GI/Hepatic negative GI ROS, Neg liver ROS,GERD  ,,  Endo/Other  negative endocrine ROS    Renal/GU negative Renal ROS  negative genitourinary   Musculoskeletal   Abdominal   Peds  Hematology negative hematology ROS (+)   Anesthesia Other Findings   Reproductive/Obstetrics negative OB ROS                             Anesthesia Physical Anesthesia Plan  ASA: 2  Anesthesia Plan: General   Post-op Pain Management:    Induction:   PONV Risk Score and Plan: Propofol infusion  Airway Management Planned:   Additional Equipment:   Intra-op Plan:   Post-operative Plan:   Informed Consent: I have reviewed the patients History and Physical, chart, labs and discussed the procedure including the risks, benefits and alternatives for the proposed anesthesia with the patient or authorized representative who has indicated his/her understanding and acceptance.     Dental Advisory Given  Plan Discussed with: CRNA  Anesthesia Plan Comments:        Anesthesia Quick Evaluation

## 2023-03-20 NOTE — Transfer of Care (Addendum)
Immediate Anesthesia Transfer of Care Note  Patient: Victor Conner  Procedure(s) Performed: COLONOSCOPY WITH PROPOFOL ESOPHAGOGASTRODUODENOSCOPY (EGD) WITH PROPOFOL  Patient Location: PACU  Anesthesia Type:General  Level of Consciousness: awake and patient cooperative  Airway & Oxygen Therapy: Patient Spontanous Breathing  Post-op Assessment: Report given to RN and Post -op Vital signs reviewed and stable  Post vital signs: Reviewed and stable  Last Vitals:  Vitals Value Taken Time  BP 108/69 03/20/23   0813  Temp 36.4 03/20/23   0813     Pulse 84 03/20/23   0813  Resp 18 03/20/23   0813  SpO2 98% 03/20/23   0813    Last Pain:  Vitals:   03/20/23 0641  PainSc: 0-No pain         Complications: No notable events documented.

## 2023-03-20 NOTE — H&P (Addendum)
@LOGO @   Primary Care Physician:  Anabel Halon, MD Primary Gastroenterologist:  Dr. Jena Gauss  Pre-Procedure History & Physical: HPI:  Victor Conner is a 57 y.o. male here for further evaluation of a positive Cologuard.  No prior colonoscopy.  Longstanding GERD.  No dysphagia.  Here for screening EGD as well.  Past Medical History:  Diagnosis Date   Anxiety    Arthritis    left knee   GERD (gastroesophageal reflux disease)    Hypercholesteremia    Hypertension     Past Surgical History:  Procedure Laterality Date   ANTERIOR CERVICAL DECOMP/DISCECTOMY FUSION N/A 05/09/2019   Procedure: C4-5, C5-6 ANTERIOR CERVICAL DECOMPRESSION/DISCECTOMY FUSION, ALLOGRAFT, PLATE;  Surgeon: Eldred Manges, MD;  Location: [facility] OR;  Service: Orthopedics;  Laterality: N/A;   BONE CYST EXCISION     left leg   INCISION AND DRAINAGE Right    hand   TOTAL KNEE ARTHROPLASTY Left 03/16/2020   Procedure: LEFT TOTAL KNEE ARTHROPLASTY;  Surgeon: Kathryne Hitch, MD;  Location: [facility] ORS;  Service: Orthopedics;  Laterality: Left;    Prior to Admission medications   Medication Sig Start Date End Date Taking? Authorizing Provider  ALPRAZolam Prudy Feeler) 0.5 MG tablet Take 1 tablet (0.5 mg total) by mouth 2 (two) times daily as needed for anxiety. 03/02/23  Yes Anabel Halon, MD  amLODipine (NORVASC) 10 MG tablet Take 1 tablet (10 mg total) by mouth daily. 12/15/22  Yes Anabel Halon, MD  Cholecalciferol (VITAMIN D-3) 125 MCG (5000 UT) TABS Take 5,000 Units by mouth daily.   Yes [provider]  esomeprazole (NEXIUM) 40 MG capsule Take 1 capsule (40 mg total) by mouth daily. 12/15/22  Yes Anabel Halon, MD  lisinopril (ZESTRIL) 20 MG tablet Take 1 tablet (20 mg total) by mouth daily. 09/30/22  Yes Anabel Halon, MD  simvastatin (ZOCOR) 40 MG tablet TAKE (1) TABLET BY MOUTH AT BEDTIME. 02/12/23  Yes Anabel Halon, MD  tamsulosin (FLOMAX) 0.4 MG CAPS capsule Take 1 capsule (0.4 mg total) by mouth  daily after supper. Patient taking differently: Take 0.4 mg by mouth daily. 03/12/23  Yes Anabel Halon, MD  cyclobenzaprine (FLEXERIL) 10 MG tablet Take 1 tablet (10 mg total) by mouth 2 (two) times daily as needed for muscle spasms. Patient not taking: Reported on 03/13/2023 12/15/22   Anabel Halon, MD  fluticasone [facility]) 50 MCG/ACT nasal spray Place 2 sprays into both nostrils daily. Patient taking differently: Place 2 sprays into both nostrils daily as needed for allergies. 04/21/22   Anabel Halon, MD    Allergies as of 01/29/2023 - Review Complete 01/29/2023  Allergen Reaction Noted   Morphine and codeine Itching 05/09/2019   Shrimp [shellfish allergy] Swelling 03/06/2020    Family History  Problem Relation Age of Onset   Cancer Father    Cancer Sister    Heart disease Paternal Grandmother    Colon cancer Neg Hx    Colon polyps Neg Hx     Social History   Socioeconomic History   Marital status: Married    Spouse name: Not on file   Number of children: 3   Years of education: Not on file   Highest education level: Not on file  Occupational History   Occupation: Self-employed- [employer]  Tobacco Use   Smoking status: Never   Smokeless tobacco: Never  Vaping Use   Vaping Use: Never used  Substance and Sexual Activity   Alcohol use:  Not Currently    Comment: 12+ beer over a couple days on the weekend. May also drink during the week.   Drug use: Never   Sexual activity: Yes  Other Topics Concern   Not on file  Social History Narrative   Not on file   Social Determinants of Health   Financial Resource Strain: Not on file  Food Insecurity: Not on file  Transportation Needs: Not on file  Physical Activity: Not on file  Stress: Not on file  Social Connections: Not on file  Intimate Partner Violence: Not on file    Review of Systems: See HPI, otherwise negative ROS  Physical Exam: BP (!) 141/82 (BP Location: Right Arm)   Pulse 84   Temp 97.9 F (36.6 C)    Resp 18   Ht 6\' 3"  (1.905 m)   Wt 132.2 kg   SpO2 98%   BMI 36.43 kg/m  General:   Alert,  Well-developed, well-nourished, pleasant and cooperative in NAD Neck:  Supple; no masses or thyromegaly. No significant cervical adenopathy. Lungs:  Clear throughout to auscultation.   No wheezes, crackles, or rhonchi. No acute distress. Heart:  Regular rate and rhythm; no murmurs, clicks, rubs,  or gallops. Abdomen: Non-distended, normal bowel sounds.  Soft and nontender without appreciable mass or hepatosplenomegaly.    Impression/Plan: 57 year old with a positive Cologuard here for first ever colonoscopy.  Longstanding GERD no alarm features.  Here for screening EGD as well.  The risks, benefits, limitations, imponderables and alternatives regarding both EGD and colonoscopy have been reviewed with the patient. Questions have been answered. All parties agreeable.       Notice: This dictation was prepared with Dragon dictation along with smaller phrase technology. Any transcriptional errors that result from this process are unintentional and may not be corrected upon review.

## 2023-03-23 LAB — SURGICAL PATHOLOGY

## 2023-03-23 NOTE — Anesthesia Postprocedure Evaluation (Signed)
Anesthesia Post Note  Patient: Victor Conner  Procedure(s) Performed: COLONOSCOPY WITH PROPOFOL ESOPHAGOGASTRODUODENOSCOPY (EGD) WITH PROPOFOL  Patient location during evaluation: Phase II Anesthesia Type: General Level of consciousness: awake Pain management: pain level controlled Vital Signs Assessment: post-procedure vital signs reviewed and stable Respiratory status: spontaneous breathing and respiratory function stable Cardiovascular status: blood pressure returned to baseline and stable Postop Assessment: no headache and no apparent nausea or vomiting Anesthetic complications: no Comments: Late entry   No notable events documented.   Last Vitals:  Vitals:   03/20/23 0641 03/20/23 0813  BP: (!) 141/82 108/69  Pulse: 84   Resp: 18 18  Temp: 36.6 C 36.4 C  SpO2: 98% 98%    Last Pain:  Vitals:   03/23/23 1153  PainSc: 0-No pain                 Windell Norfolk

## 2023-03-25 ENCOUNTER — Other Ambulatory Visit: Payer: Self-pay | Admitting: Internal Medicine

## 2023-03-25 DIAGNOSIS — E782 Mixed hyperlipidemia: Secondary | ICD-10-CM

## 2023-03-26 ENCOUNTER — Encounter: Payer: Self-pay | Admitting: Internal Medicine

## 2023-03-27 ENCOUNTER — Encounter (HOSPITAL_COMMUNITY): Payer: Self-pay | Admitting: Internal Medicine

## 2023-05-07 ENCOUNTER — Ambulatory Visit: Payer: BC Managed Care – PPO | Admitting: Internal Medicine

## 2023-05-15 ENCOUNTER — Ambulatory Visit: Payer: BC Managed Care – PPO | Admitting: Urology

## 2023-05-20 ENCOUNTER — Other Ambulatory Visit: Payer: Self-pay | Admitting: Internal Medicine

## 2023-05-20 DIAGNOSIS — E782 Mixed hyperlipidemia: Secondary | ICD-10-CM

## 2023-05-20 DIAGNOSIS — J0110 Acute frontal sinusitis, unspecified: Secondary | ICD-10-CM

## 2023-06-02 ENCOUNTER — Telehealth: Payer: BC Managed Care – PPO | Admitting: Internal Medicine

## 2023-06-02 ENCOUNTER — Encounter: Payer: Self-pay | Admitting: Internal Medicine

## 2023-06-02 DIAGNOSIS — J0111 Acute recurrent frontal sinusitis: Secondary | ICD-10-CM | POA: Insufficient documentation

## 2023-06-02 MED ORDER — LEVOCETIRIZINE DIHYDROCHLORIDE 5 MG PO TABS
5.0000 mg | ORAL_TABLET | Freq: Every evening | ORAL | 3 refills | Status: DC
Start: 2023-06-02 — End: 2023-11-16

## 2023-06-02 MED ORDER — AMOXICILLIN-POT CLAVULANATE 875-125 MG PO TABS
1.0000 | ORAL_TABLET | Freq: Two times a day (BID) | ORAL | 0 refills | Status: DC
Start: 2023-06-02 — End: 2024-02-02

## 2023-06-02 NOTE — Assessment & Plan Note (Addendum)
Persistent symptoms despite symptomatic treatment -started empiric Augmentin Nasal saline spray as needed for nasal congestion. Added Xyzal for allergies Flonase for allergies He works in [employer], has to go in crawl spaces and other enclosed spaces, which can expose him to wide array of allergens

## 2023-06-02 NOTE — Progress Notes (Signed)
Virtual Visit via Video Note   Because of Victor Conner's co-morbid illnesses, he is at least at moderate risk for complications without adequate follow up.  This format is felt to be most appropriate for this patient at this time.  All issues noted in this document were discussed and addressed.  A limited physical exam was performed with this format.      Evaluation Performed:  Follow-up visit  Date:  06/02/2023   ID:  Victor Conner 1966/04/14, MRN [id number]  Patient Location: Home Provider Location: Office/Clinic  Participants: Patient Location of Patient: Home Location of Provider: Telehealth Consent was obtain for visit to be over via telehealth. I verified that I am speaking with the correct person using two identifiers.  PCP:  Anabel Halon, MD   Chief Complaint: Nasal congestion and postnasal drip  History of Present Illness:    Victor Conner is a 57 y.o. male who has a televisit for complaint of nasal congestion, postnasal drip and sore throat, recently worse for the last 1 week.  He has sinus pressure related headache as well.  He denies any fever or chills.  He has been using Flonase for allergic sinusitis, which had been helping him.  He tried taking Sudafed with some relief.  He denies any fever, chills, dyspnea or wheezing currently.  The patient does not have symptoms concerning for COVID-19 infection (fever, chills, cough, or new shortness of breath).   Past Medical, Surgical, Social History, Allergies, and Medications have been Reviewed.  Past Medical History:  Diagnosis Date   Anxiety    Arthritis    left knee   GERD (gastroesophageal reflux disease)    Hypercholesteremia    Hypertension    Past Surgical History:  Procedure Laterality Date   ANTERIOR CERVICAL DECOMP/DISCECTOMY FUSION N/A 05/09/2019   Procedure: C4-5, C5-6 ANTERIOR CERVICAL DECOMPRESSION/DISCECTOMY FUSION, ALLOGRAFT, PLATE;  Surgeon: Eldred Manges, MD;  Location: [facility];   Service: Orthopedics;  Laterality: N/A;   BONE CYST EXCISION     left leg   COLONOSCOPY WITH PROPOFOL N/A 03/20/2023   Procedure: COLONOSCOPY WITH PROPOFOL;  Surgeon: Corbin Ade, MD;  [facility];  Service: Endoscopy;  Laterality: N/A;  7:30 am, asa 3   ESOPHAGOGASTRODUODENOSCOPY (EGD) WITH PROPOFOL N/A 03/20/2023   Procedure: ESOPHAGOGASTRODUODENOSCOPY (EGD) WITH PROPOFOL;  Surgeon: Corbin Ade, MD;  [facility];  Service: Endoscopy;  Laterality: N/A;   INCISION AND DRAINAGE Right    hand   TOTAL KNEE ARTHROPLASTY Left 03/16/2020   Procedure: LEFT TOTAL KNEE ARTHROPLASTY;  Surgeon: Kathryne Hitch, MD;  Location: [facility];  Service: Orthopedics;  Laterality: Left;     Current Meds  Medication Sig   ALPRAZolam (XANAX) 0.5 MG tablet Take 1 tablet (0.5 mg total) by mouth 2 (two) times daily as needed for anxiety.   amLODipine (NORVASC) 10 MG tablet Take 1 tablet (10 mg total) by mouth daily.   amoxicillin-clavulanate (AUGMENTIN) 875-125 MG tablet Take 1 tablet by mouth 2 (two) times daily.   Cholecalciferol (VITAMIN D-3) 125 MCG (5000 UT) TABS Take 5,000 Units by mouth daily.   cyclobenzaprine (FLEXERIL) 10 MG tablet Take 1 tablet (10 mg total) by mouth 2 (two) times daily as needed for muscle spasms.   esomeprazole (NEXIUM) 40 MG capsule Take 1 capsule (40 mg total) by mouth daily.   fluticasone (FLONASE) 50 MCG/ACT nasal spray PLACE 2 SPRAYS INTO BOTH NOSTRILS ONCE DAILY.  levocetirizine (XYZAL) 5 MG tablet Take 1 tablet (5 mg total) by mouth every evening.   lisinopril (ZESTRIL) 20 MG tablet Take 1 tablet (20 mg total) by mouth daily.   simvastatin (ZOCOR) 40 MG tablet TAKE (1) TABLET BY MOUTH AT BEDTIME.   tamsulosin (FLOMAX) 0.4 MG CAPS capsule Take 1 capsule (0.4 mg total) by mouth daily after supper. (Patient taking differently: Take 0.4 mg by mouth daily.)     Allergies:   Morphine and codeine and Shrimp [shellfish allergy]   ROS:   Please  see the history of present illness.     All other systems reviewed and are negative.   Labs/Other Tests and Data Reviewed:    Recent Labs: 12/15/2022: ALT 23; BUN 21; Creatinine, Ser 1.18; Hemoglobin 15.7; Platelets 204; Potassium 5.2; Sodium 140; TSH 0.898   Recent Lipid Panel Lab Results  Component Value Date/Time   CHOL 181 12/15/2022 10:14 AM   TRIG 70 12/15/2022 10:14 AM   HDL 66 12/15/2022 10:14 AM   CHOLHDL 2.7 12/15/2022 10:14 AM   LDLCALC 102 (H) 12/15/2022 10:14 AM    Wt Readings from Last 3 Encounters:  03/20/23 291 lb 7.2 oz (132.2 kg)  03/19/23 291 lb 6.4 oz (132.2 kg)  03/02/23 291 lb 6.4 oz (132.2 kg)     Objective:    Vital Signs:  There were no vitals taken for this visit.   VITAL SIGNS:  reviewed GEN:  no acute distress EYES:  sclerae anicteric, EOMI - Extraocular Movements Intact RESPIRATORY:  normal respiratory effort, symmetric expansion NEURO:  alert and oriented x 3, no obvious focal deficit PSYCH:  normal affect  ASSESSMENT & PLAN:    Acute recurrent frontal sinusitis Persistent symptoms despite symptomatic treatment -started empiric Augmentin Nasal saline spray as needed for nasal congestion. Added Xyzal for allergies Flonase for allergies He works in [employer], has to go in crawl spaces and other enclosed spaces, which can expose him to wide array of allergens   I discussed the assessment and treatment plan with the patient. The patient was provided an opportunity to ask questions, and all were answered. The patient agreed with the plan and demonstrated an understanding of the instructions.   The patient was advised to call back or seek an in-person evaluation if the symptoms worsen or if the condition fails to improve as anticipated.  The above assessment and management plan was discussed with the patient. The patient verbalized understanding of and has agreed to the management plan.   Medication Adjustments/Labs and Tests  Ordered: Current medicines are reviewed at length with the patient today.  Concerns regarding medicines are outlined above.   Tests Ordered: No orders of the defined types were placed in this encounter.   Medication Changes: Meds ordered this encounter  Medications   levocetirizine (XYZAL) 5 MG tablet    Sig: Take 1 tablet (5 mg total) by mouth every evening.    Dispense:  30 tablet    Refill:  3   amoxicillin-clavulanate (AUGMENTIN) 875-125 MG tablet    Sig: Take 1 tablet by mouth 2 (two) times daily.    Dispense:  14 tablet    Refill:  0     Note: This dictation was prepared with Dragon dictation along with smaller phrase technology. Similar sounding words can be transcribed inadequately or may not be corrected upon review. Any transcriptional errors that result from this process are unintentional.      Disposition:  Follow up  Signed, Evelyne Makepeace K  Allena Katz, MD  06/02/2023 11:50 AM     [facility]

## 2023-06-14 ENCOUNTER — Other Ambulatory Visit: Payer: Self-pay | Admitting: Internal Medicine

## 2023-06-14 DIAGNOSIS — K219 Gastro-esophageal reflux disease without esophagitis: Secondary | ICD-10-CM

## 2023-06-16 ENCOUNTER — Other Ambulatory Visit: Payer: Self-pay

## 2023-06-16 DIAGNOSIS — M79642 Pain in left hand: Secondary | ICD-10-CM

## 2023-06-25 DIAGNOSIS — M65941 Unspecified synovitis and tenosynovitis, right hand: Secondary | ICD-10-CM | POA: Diagnosis not present

## 2023-06-25 DIAGNOSIS — M19041 Primary osteoarthritis, right hand: Secondary | ICD-10-CM | POA: Diagnosis not present

## 2023-07-01 ENCOUNTER — Other Ambulatory Visit: Payer: Self-pay

## 2023-07-01 DIAGNOSIS — E782 Mixed hyperlipidemia: Secondary | ICD-10-CM

## 2023-07-01 MED ORDER — SIMVASTATIN 40 MG PO TABS
ORAL_TABLET | ORAL | 0 refills | Status: DC
Start: 2023-07-01 — End: 2023-08-11

## 2023-07-16 ENCOUNTER — Other Ambulatory Visit: Payer: Self-pay

## 2023-07-16 DIAGNOSIS — I10 Essential (primary) hypertension: Secondary | ICD-10-CM

## 2023-07-16 MED ORDER — AMLODIPINE BESYLATE 10 MG PO TABS
10.0000 mg | ORAL_TABLET | Freq: Every day | ORAL | 1 refills | Status: DC
Start: 2023-07-16 — End: 2024-01-14

## 2023-07-17 ENCOUNTER — Ambulatory Visit: Payer: BC Managed Care – PPO | Admitting: Orthopedic Surgery

## 2023-07-20 ENCOUNTER — Ambulatory Visit: Payer: BC Managed Care – PPO | Admitting: Urology

## 2023-07-29 ENCOUNTER — Other Ambulatory Visit: Payer: Self-pay | Admitting: Internal Medicine

## 2023-07-29 DIAGNOSIS — M5441 Lumbago with sciatica, right side: Secondary | ICD-10-CM

## 2023-07-29 MED ORDER — CELECOXIB 100 MG PO CAPS
100.0000 mg | ORAL_CAPSULE | Freq: Two times a day (BID) | ORAL | 0 refills | Status: DC
Start: 2023-07-29 — End: 2024-06-14

## 2023-07-29 MED ORDER — CYCLOBENZAPRINE HCL 10 MG PO TABS
10.0000 mg | ORAL_TABLET | Freq: Two times a day (BID) | ORAL | 0 refills | Status: DC | PRN
Start: 2023-07-29 — End: 2024-06-14

## 2023-08-05 DIAGNOSIS — M545 Low back pain, unspecified: Secondary | ICD-10-CM | POA: Diagnosis not present

## 2023-08-06 DIAGNOSIS — M5416 Radiculopathy, lumbar region: Secondary | ICD-10-CM | POA: Diagnosis not present

## 2023-08-07 DIAGNOSIS — M5451 Vertebrogenic low back pain: Secondary | ICD-10-CM | POA: Diagnosis not present

## 2023-08-11 ENCOUNTER — Other Ambulatory Visit: Payer: Self-pay | Admitting: Internal Medicine

## 2023-08-11 DIAGNOSIS — E782 Mixed hyperlipidemia: Secondary | ICD-10-CM

## 2023-08-26 ENCOUNTER — Ambulatory Visit: Payer: BC Managed Care – PPO | Admitting: Urology

## 2023-08-26 VITALS — BP 127/79 | HR 103

## 2023-08-26 DIAGNOSIS — N400 Enlarged prostate without lower urinary tract symptoms: Secondary | ICD-10-CM

## 2023-08-26 DIAGNOSIS — N411 Chronic prostatitis: Secondary | ICD-10-CM

## 2023-08-26 DIAGNOSIS — R3912 Poor urinary stream: Secondary | ICD-10-CM

## 2023-08-26 LAB — URINALYSIS, ROUTINE W REFLEX MICROSCOPIC
Bilirubin, UA: NEGATIVE
Glucose, UA: NEGATIVE
Ketones, UA: NEGATIVE
Leukocytes,UA: NEGATIVE
Nitrite, UA: NEGATIVE
Protein,UA: NEGATIVE
RBC, UA: NEGATIVE
Specific Gravity, UA: 1.02 (ref 1.005–1.030)
Urobilinogen, Ur: 0.2 mg/dL (ref 0.2–1.0)
pH, UA: 6.5 (ref 5.0–7.5)

## 2023-08-26 MED ORDER — ALFUZOSIN HCL ER 10 MG PO TB24
10.0000 mg | ORAL_TABLET | Freq: Every day | ORAL | 11 refills | Status: DC
Start: 2023-08-26 — End: 2024-06-14

## 2023-08-26 NOTE — Progress Notes (Signed)
08/26/2023 1:29 PM   Victor Conner 10-27-1965 [id number]  Referring provider: Anabel Halon, MD [address] [place],  [place] [address]  Followup prostatitis   HPI: Victor Conner is a 57yo here for followuop for prostatitis and BPH. No prostatitis flares since last visit. No dysuria or hematuria. IPSS 2 QOL 0 on flomax 0.4mg . Urine stream strong. No straining to urinate. Nocturia 0-1x. No other complaints today   PMH: Past Medical History:  Diagnosis Date   Anxiety    Arthritis    left knee   GERD (gastroesophageal reflux disease)    Hypercholesteremia    Hypertension     Surgical History: Past Surgical History:  Procedure Laterality Date   ANTERIOR CERVICAL DECOMP/DISCECTOMY FUSION N/A 05/09/2019   Procedure: C4-5, C5-6 ANTERIOR CERVICAL DECOMPRESSION/DISCECTOMY FUSION, ALLOGRAFT, PLATE;  Surgeon: Eldred Manges, MD;  Location: [facility];  Service: Orthopedics;  Laterality: N/A;   BONE CYST EXCISION     left leg   COLONOSCOPY WITH PROPOFOL N/A 03/20/2023   Procedure: COLONOSCOPY WITH PROPOFOL;  Surgeon: Corbin Ade, MD;  [facility];  Service: Endoscopy;  Laterality: N/A;  7:30 am, asa 3   ESOPHAGOGASTRODUODENOSCOPY (EGD) WITH PROPOFOL N/A 03/20/2023   Procedure: ESOPHAGOGASTRODUODENOSCOPY (EGD) WITH PROPOFOL;  Surgeon: Corbin Ade, MD;  [facility];  Service: Endoscopy;  Laterality: N/A;   INCISION AND DRAINAGE Right    hand   TOTAL KNEE ARTHROPLASTY Left 03/16/2020   Procedure: LEFT TOTAL KNEE ARTHROPLASTY;  Surgeon: Kathryne Hitch, MD;  Location: [facility];  Service: Orthopedics;  Laterality: Left;    Home Medications:  Allergies as of 08/26/2023       Reactions   Morphine And Codeine Itching   Shrimp [shellfish Allergy] Swelling   Eyes/Throat swelling        Medication List        Accurate as of August 26, 2023  1:29 PM. If you have any questions, ask your nurse or doctor.          ALPRAZolam 0.5 MG  tablet Commonly known as: XANAX Take 1 tablet (0.5 mg total) by mouth 2 (two) times daily as needed for anxiety.   amLODipine 10 MG tablet Commonly known as: NORVASC Take 1 tablet (10 mg total) by mouth daily.   amoxicillin-clavulanate 875-125 MG tablet Commonly known as: AUGMENTIN Take 1 tablet by mouth 2 (two) times daily.   celecoxib 100 MG capsule Commonly known as: CeleBREX Take 1 capsule (100 mg total) by mouth 2 (two) times daily.   cyclobenzaprine 10 MG tablet Commonly known as: FLEXERIL Take 1 tablet (10 mg total) by mouth 2 (two) times daily as needed for muscle spasms.   esomeprazole 40 MG capsule Commonly known as: NEXIUM TAKE (1) CAPSULE BY MOUTH ONCE DAILY.   fluticasone 50 MCG/ACT nasal spray Commonly known as: FLONASE PLACE 2 SPRAYS INTO BOTH NOSTRILS ONCE DAILY.   levocetirizine 5 MG tablet Commonly known as: XYZAL Take 1 tablet (5 mg total) by mouth every evening.   lisinopril 20 MG tablet Commonly known as: ZESTRIL Take 1 tablet (20 mg total) by mouth daily.   simvastatin 40 MG tablet Commonly known as: ZOCOR TAKE (1) TABLET BY MOUTH AT BEDTIME.   tamsulosin 0.4 MG Caps capsule Commonly known as: FLOMAX Take 1 capsule (0.4 mg total) by mouth daily after supper. What changed: when to take this   Vitamin D-3 125 MCG (5000 UT) Tabs Take 5,000 Units by mouth daily.  Allergies:  Allergies  Allergen Reactions   Morphine And Codeine Itching   Shrimp [Shellfish Allergy] Swelling    Eyes/Throat swelling    Family History: Family History  Problem Relation Age of Onset   Cancer Father    Cancer Sister    Heart disease Paternal Grandmother    Colon cancer Neg Hx    Colon polyps Neg Hx     Social History:  reports that he has never smoked. He has never used smokeless tobacco. He reports that he does not currently use alcohol. He reports that he does not use drugs.  ROS: All other review of systems were reviewed and are negative  except what is noted above in HPI  Physical Exam: BP 127/79   Pulse (!) 103   Constitutional:  Alert and oriented, No acute distress. HEENT: [place] AT, moist mucus membranes.  Trachea midline, no masses. Cardiovascular: No clubbing, cyanosis, or edema. Respiratory: Normal respiratory effort, no increased work of breathing. GI: Abdomen is soft, nontender, nondistended, no abdominal masses GU: No CVA tenderness.  Lymph: No cervical or inguinal lymphadenopathy. Skin: No rashes, bruises or suspicious lesions. Neurologic: Grossly intact, no focal deficits, moving all 4 extremities. Psychiatric: Normal mood and affect.  Laboratory Data: Lab Results  Component Value Date   WBC 7.9 12/15/2022   HGB 15.7 12/15/2022   HCT 46.3 12/15/2022   MCV 90 12/15/2022   PLT 204 12/15/2022    Lab Results  Component Value Date   CREATININE 1.18 12/15/2022    No results found for: "PSA"  No results found for: "TESTOSTERONE"  Lab Results  Component Value Date   HGBA1C 5.7 (H) 12/15/2022    Urinalysis    Component Value Date/Time   APPEARANCEUR Clear 02/13/2023 1048   GLUCOSEU Negative 02/13/2023 1048   BILIRUBINUR Negative 02/13/2023 1048   KETONESUR negative 12/20/2021 1043   PROTEINUR Negative 02/13/2023 1048   UROBILINOGEN 0.2 12/20/2021 1043   NITRITE Negative 02/13/2023 1048   LEUKOCYTESUR Negative 02/13/2023 1048    Lab Results  Component Value Date   LABMICR Comment 02/13/2023    Pertinent Imaging:  No results found for this or any previous visit.  No results found for this or any previous visit.  No results found for this or any previous visit.  No results found for this or any previous visit.  No results found for this or any previous visit.  No valid procedures specified. No results found for this or any previous visit.  No results found for this or any previous visit.   Assessment & Plan:    1. Chronic prostatitis Uroxatral 10mg  qhs - Urinalysis, Routine  w reflex microscopic  2. Benign prostatic hyperplasia, unspecified whether lower urinary tract symptoms present Switch to uroxatral 10mg  qhs  3. Weak urinary stream Uroxatral 10mg  qhs   No follow-ups on file.  Victor Aye, MD  [facility] [place]

## 2023-09-01 ENCOUNTER — Encounter: Payer: Self-pay | Admitting: Urology

## 2023-09-01 NOTE — Patient Instructions (Signed)

## 2023-09-10 DIAGNOSIS — M5416 Radiculopathy, lumbar region: Secondary | ICD-10-CM | POA: Diagnosis not present

## 2023-10-05 ENCOUNTER — Other Ambulatory Visit: Payer: Self-pay | Admitting: Internal Medicine

## 2023-10-05 DIAGNOSIS — E782 Mixed hyperlipidemia: Secondary | ICD-10-CM

## 2023-10-13 ENCOUNTER — Other Ambulatory Visit: Payer: Self-pay | Admitting: Internal Medicine

## 2023-10-13 DIAGNOSIS — I10 Essential (primary) hypertension: Secondary | ICD-10-CM

## 2023-10-22 DIAGNOSIS — M5416 Radiculopathy, lumbar region: Secondary | ICD-10-CM | POA: Diagnosis not present

## 2023-11-02 ENCOUNTER — Other Ambulatory Visit: Payer: Self-pay | Admitting: Internal Medicine

## 2023-11-02 DIAGNOSIS — J0110 Acute frontal sinusitis, unspecified: Secondary | ICD-10-CM

## 2023-11-09 DIAGNOSIS — M5416 Radiculopathy, lumbar region: Secondary | ICD-10-CM | POA: Diagnosis not present

## 2023-11-15 ENCOUNTER — Other Ambulatory Visit: Payer: Self-pay | Admitting: Internal Medicine

## 2023-11-15 DIAGNOSIS — J0111 Acute recurrent frontal sinusitis: Secondary | ICD-10-CM

## 2023-12-05 ENCOUNTER — Other Ambulatory Visit: Payer: Self-pay | Admitting: Internal Medicine

## 2023-12-05 DIAGNOSIS — E782 Mixed hyperlipidemia: Secondary | ICD-10-CM

## 2023-12-25 ENCOUNTER — Other Ambulatory Visit: Payer: Self-pay | Admitting: Internal Medicine

## 2023-12-25 DIAGNOSIS — K219 Gastro-esophageal reflux disease without esophagitis: Secondary | ICD-10-CM

## 2024-01-14 ENCOUNTER — Other Ambulatory Visit: Payer: Self-pay | Admitting: Internal Medicine

## 2024-01-14 DIAGNOSIS — I10 Essential (primary) hypertension: Secondary | ICD-10-CM

## 2024-01-27 ENCOUNTER — Other Ambulatory Visit: Payer: Self-pay | Admitting: Internal Medicine

## 2024-01-27 DIAGNOSIS — E782 Mixed hyperlipidemia: Secondary | ICD-10-CM

## 2024-01-27 DIAGNOSIS — J0110 Acute frontal sinusitis, unspecified: Secondary | ICD-10-CM

## 2024-02-01 ENCOUNTER — Ambulatory Visit: Payer: Self-pay | Admitting: *Deleted

## 2024-02-01 NOTE — Telephone Encounter (Signed)
 Patient scheduled.

## 2024-02-01 NOTE — Telephone Encounter (Signed)
 Copied from CRM [id number]. Topic: Clinical - Red Word Triage >> Feb 01, 2024  8:46 AM Oddis Bench wrote: Red Word that prompted transfer to Nurse Triage: Patient is calling that his mucus is green. He is also having aches and body sweats and it comes and goes. The meds he was prescribed are not helping. Reason for Disposition  [1] Fever returns after gone for over 24 hours AND [2] symptoms worse or not improved  Answer Assessment - Initial Assessment Questions 1. ONSET: "When did the cough begin?"      I have green mucus.   I am coughing up a little.   My sinuses are also congested with green mucus.   I have body aches.  Started last [employer].   I'm hot and cold.    2. SEVERITY: "How bad is the cough today?"      When I cough it's green also 3. SPUTUM: "Describe the color of your sputum" (none, dry cough; clear, white, yellow, green)     Green mucus 4. HEMOPTYSIS: "Are you coughing up any blood?" If so ask: "How much?" (flecks, streaks, tablespoons, etc.)     Not asked 5. DIFFICULTY BREATHING: "Are you having difficulty breathing?" If Yes, ask: "How bad is it?" (e.g., mild, moderate, severe)    - MILD: No SOB at rest, mild SOB with walking, speaks normally in sentences, can lie down, no retractions, pulse < 100.    - MODERATE: SOB at rest, SOB with minimal exertion and prefers to sit, cannot lie down flat, speaks in phrases, mild retractions, audible wheezing, pulse 100-120.    - SEVERE: Very SOB at rest, speaks in single words, struggling to breathe, sitting hunched forward, retractions, pulse > 120      No shortness of breath or chest tightness 6. FEVER: "Do you have a fever?" If Yes, ask: "What is your temperature, how was it measured, and when did it start?"     I think so. I'm having headaches.  I took cold medicine and it helped last night.   I leaving town and wanted to get rid of this cold before I go. 7. CARDIAC HISTORY: "Do you have any history of heart disease?" (e.g., heart attack,  congestive heart failure)      Not asked 8. LUNG HISTORY: "Do you have any history of lung disease?"  (e.g., pulmonary embolus, asthma, emphysema)     Not asked 9. PE RISK FACTORS: "Do you have a history of blood clots?" (or: recent major surgery, recent prolonged travel, bedridden)     Not asked 10. OTHER SYMPTOMS: "Do you have any other symptoms?" (e.g., runny nose, wheezing, chest pain)       Runny nose, sinus pressure, green mucus., sore throat went away but did have one on THur. 11. PREGNANCY: "Is there any chance you are pregnant?" "When was your last menstrual period?"       N/A 12. TRAVEL: "Have you traveled out of the country in the last month?" (e.g., travel history, exposures)       N/A  Protocols used: Cough - Acute Productive-A-AH  Chief Complaint: sinus congestion, coughing and green mucus Symptoms: Sinus pressure and congestion not really getting better Frequency: Started last Thur. Pertinent Negatives: Patient denies getting any better except sore throat is gone. Disposition: [] ED /[] Urgent Care (no appt availability in office) / [x] Appointment(In office/virtual)/ []  [facility]/ [] Home Care/ [] Refused Recommended Disposition /[] [facility]/ []  Follow-up with PCP Additional Notes: Appt made with  Dr. Lydia Sams for 02/02/2024

## 2024-02-02 ENCOUNTER — Ambulatory Visit: Payer: Self-pay | Admitting: Internal Medicine

## 2024-02-02 ENCOUNTER — Telehealth (INDEPENDENT_AMBULATORY_CARE_PROVIDER_SITE_OTHER)

## 2024-02-02 DIAGNOSIS — J0100 Acute maxillary sinusitis, unspecified: Secondary | ICD-10-CM

## 2024-02-02 MED ORDER — AZITHROMYCIN 250 MG PO TABS
ORAL_TABLET | ORAL | 0 refills | Status: AC
Start: 2024-02-02 — End: 2024-02-07

## 2024-02-02 NOTE — Progress Notes (Unsigned)
   Acute Office Visit  Subjective:     Patient ID: DIMAS BOMBA, male    DOB: [birth year], 58 y.o.   MRN: [id number]  Chief Complaint  Patient presents with   Sinusitis    Headaches, pressure in head and face, sore throat, green mucus, congestion started having symptoms Thursday and proceeded to get worse on Sunday     HPI Patient is in today for sinus infection  ROS  Virtual Visit via Video Note  I connected with Velda Gerlach on 02/04/24 at  2:20 PM EDT by a video enabled telemedicine application and verified that I am speaking with the correct person using two identifiers.  Location: Patient: car/parked  Provider: home   I discussed the limitations of evaluation and management by telemedicine and the availability of in person appointments. The patient expressed understanding and agreed to proceed.  History of Present Illness: Sinusitis: Patient presents with chronic sinusitis. The patient reports chronic sinus infections for 6 days.  His symptoms include nasal congestion, sneezing, headaches, facial pain.  There has not been a history of nasal congestion, headaches, facial pain. There does not have been a history of chronic otitis media or pharyngotonsillitis.  Prior antibiotic therapy has included none. Other medications have included Flonase , Zyrtec, oral decongestants.  He has not had allergy testing which was not done.       Observations/Objective:   Assessment and Plan:   Follow Up Instructions:    I discussed the assessment and treatment plan with the patient. The patient was provided an opportunity to ask questions and all were answered. The patient agreed with the plan and demonstrated an understanding of the instructions.   The patient was advised to call back or seek an in-person evaluation if the symptoms worsen or if the condition fails to improve as anticipated.  I provided 10 minutes of non-face-to-face time during this encounter.   Alison Irvine,  FNP      Objective:    There were no vitals taken for this visit.   Physical Exam Constitutional:      Appearance: Normal appearance.  Neurological:     Mental Status: He is alert and oriented to person, place, and time.  Psychiatric:        Mood and Affect: Mood normal.        Thought Content: Thought content normal.     No results found for any visits on 02/02/24.      Assessment & Plan:   Problem List Items Addressed This Visit   None Visit Diagnoses       Acute non-recurrent maxillary sinusitis    -  Primary   treat with oral antibiotic.  continue with OTC medications for symptom relief.   Relevant Medications   azithromycin  (ZITHROMAX ) 250 MG tablet       Meds ordered this encounter  Medications   azithromycin  (ZITHROMAX ) 250 MG tablet    Sig: Take 2 tablets on day 1, then 1 tablet daily on days 2 through 5    Dispense:  6 tablet    Refill:  0    No follow-ups on file.  Alison Irvine, FNP

## 2024-02-17 DIAGNOSIS — M5416 Radiculopathy, lumbar region: Secondary | ICD-10-CM | POA: Diagnosis not present

## 2024-04-12 ENCOUNTER — Other Ambulatory Visit: Payer: Self-pay | Admitting: Internal Medicine

## 2024-04-12 DIAGNOSIS — E782 Mixed hyperlipidemia: Secondary | ICD-10-CM

## 2024-05-05 DIAGNOSIS — M5416 Radiculopathy, lumbar region: Secondary | ICD-10-CM | POA: Diagnosis not present

## 2024-05-09 ENCOUNTER — Ambulatory Visit: Admitting: Internal Medicine

## 2024-05-09 ENCOUNTER — Encounter: Payer: Self-pay | Admitting: Internal Medicine

## 2024-05-09 VITALS — BP 144/86 | HR 81 | Ht 76.0 in | Wt 295.8 lb

## 2024-05-09 DIAGNOSIS — J309 Allergic rhinitis, unspecified: Secondary | ICD-10-CM | POA: Insufficient documentation

## 2024-05-09 DIAGNOSIS — N401 Enlarged prostate with lower urinary tract symptoms: Secondary | ICD-10-CM

## 2024-05-09 DIAGNOSIS — I10 Essential (primary) hypertension: Secondary | ICD-10-CM | POA: Diagnosis not present

## 2024-05-09 DIAGNOSIS — R3911 Hesitancy of micturition: Secondary | ICD-10-CM

## 2024-05-09 DIAGNOSIS — E782 Mixed hyperlipidemia: Secondary | ICD-10-CM

## 2024-05-09 DIAGNOSIS — E559 Vitamin D deficiency, unspecified: Secondary | ICD-10-CM

## 2024-05-09 DIAGNOSIS — L720 Epidermal cyst: Secondary | ICD-10-CM | POA: Insufficient documentation

## 2024-05-09 DIAGNOSIS — F411 Generalized anxiety disorder: Secondary | ICD-10-CM | POA: Diagnosis not present

## 2024-05-09 DIAGNOSIS — R7303 Prediabetes: Secondary | ICD-10-CM | POA: Insufficient documentation

## 2024-05-09 DIAGNOSIS — F410 Panic disorder [episodic paroxysmal anxiety] without agoraphobia: Secondary | ICD-10-CM | POA: Diagnosis not present

## 2024-05-09 DIAGNOSIS — M7989 Other specified soft tissue disorders: Secondary | ICD-10-CM | POA: Insufficient documentation

## 2024-05-09 MED ORDER — CHLORTHALIDONE 25 MG PO TABS: 12.5000 mg | ORAL_TABLET | Freq: Every day | ORAL | 1 refills | Status: DC

## 2024-05-09 MED ORDER — FLUTICASONE PROPIONATE 50 MCG/ACT NA SUSP: 2.0000 | Freq: Every day | NASAL | 2 refills | Status: DC

## 2024-05-09 MED ORDER — ALPRAZOLAM 0.5 MG PO TABS
0.5000 mg | ORAL_TABLET | Freq: Two times a day (BID) | ORAL | 0 refills | Status: AC | PRN
Start: 2024-05-09 — End: ?

## 2024-05-09 MED ORDER — CITALOPRAM HYDROBROMIDE 10 MG PO TABS: 10.0000 mg | ORAL_TABLET | Freq: Every day | ORAL | 3 refills | Status: DC

## 2024-05-09 NOTE — Assessment & Plan Note (Signed)
On Flomax Checked PSA Followed by urology

## 2024-05-09 NOTE — Patient Instructions (Addendum)
 Please start taking Chlorthalidone  12.5 mg once daily.  Please start taking Celexa  once daily. Please take Xanax  as needed for panic episode.  Please continue to take medications as prescribed.  Please perform leg elevation as tolerated for leg swelling.  Please continue to follow low salt diet and perform moderate exercise/walking at least 150 mins/week.  Please get fasting blood tests done after 2 weeks.

## 2024-05-09 NOTE — Assessment & Plan Note (Signed)
 Uncontrolled recently Has had more anxiety spells leading to panic episodes at times as well Has Xanax  0.5 mg as needed Added Celexa  10 mg once daily Relaxation techniques advised

## 2024-05-09 NOTE — Assessment & Plan Note (Signed)
 Lab Results  Component Value Date   HGBA1C 5.7 (H) 12/15/2022   Advised to follow DASH diet

## 2024-05-09 NOTE — Assessment & Plan Note (Addendum)
 BP Readings from Last 1 Encounters:  05/09/24 (!) 144/86    Uncontrolled with amlodipine  10 mg QD and lisinopril  20 mg QD Has chronic leg swelling as well Added chlorthalidone  12.5 mg QD for now Counseled for compliance with the medications Advised DASH diet and moderate exercise/walking, at least 150 mins/week

## 2024-05-09 NOTE — Assessment & Plan Note (Signed)
 Has had episodes of panic in the past in crowd and especially while in plane Usually better with Xanax  as needed -his recent episode of flushing and dyspnea could be from panic

## 2024-05-09 NOTE — Assessment & Plan Note (Signed)
 Well controlled with Flonase, refilled

## 2024-05-09 NOTE — Progress Notes (Addendum)
 Established Patient Office Visit  Subjective:  Patient ID: Victor Conner, male    DOB: [birth year]  Age: 58 y.o. MRN: [id number]  CC:  Chief Complaint  Patient presents with   Otalgia    Pt reports sx of ear ache on left side.    Panic Attack    Pt reports sx of anxiety .     HPI Victor Conner is a 58 y.o. male with past medical history of HTN, GERD, HLD, cervical spinal stenosis and panic disorder who presents for f/u of his chronic medical conditions.  HTN: BP is elevated today. His blood pressure has been borderline elevated at home - around 140s/70s. Takes amlodipine  10 mg QD and lisinopril  20 mg QD regularly. Patient denies headache, dizziness, chest pain, dyspnea or palpitations.  He reports chronic leg swelling, especially with prolonged standing at his workplace.  GERD: He is currently taking Nexium  40 mg QD currently.  Denies any nausea or vomiting currently.  Denies any melena or hematochezia currently.  Panic disorder: He has had episodes of panic in crowded places and while flying.  He feels better with Xanax  usually, but takes it rarely.  He reports having panic episodes at nighttime, when he suddenly wakes up with palpitations, dyspnea and excessive sweating.  He reports improvement in symptoms with Xanax .  His wife reports that he has been having more frequent anxiety spells recently.  Denies anhedonia, insomnia, SI or HI currently.  Left ear discomfort: She reports intermittent left ear discomfort and tinnitus.  He has history of impacted earwax, which has required ear irrigation.  Denies any ear discharge, fever or chills.  His ear exam is benign today, he agrees to use Debrox eardrops as needed for excess earwax.  He also reports a lump on his upper back on the right side.  He has a history of a smaller cyst, which has been growing in size and has mild soreness upon pressure while he sits and takes back support.  Denies any local erythema, surrounding swelling or  discharge.      Past Medical History:  Diagnosis Date   Anxiety    Arthritis    left knee   GERD (gastroesophageal reflux disease)    Hypercholesteremia    Hypertension     Past Surgical History:  Procedure Laterality Date   ANTERIOR CERVICAL DECOMP/DISCECTOMY FUSION N/A 05/09/2019   Procedure: C4-5, C5-6 ANTERIOR CERVICAL DECOMPRESSION/DISCECTOMY FUSION, ALLOGRAFT, PLATE;  Surgeon: Barbarann Oneil BROCKS, MD;  Location: [facility];  Service: Orthopedics;  Laterality: N/A;   BONE CYST EXCISION     left leg   COLONOSCOPY WITH PROPOFOL  N/A 03/20/2023   Procedure: COLONOSCOPY WITH PROPOFOL ;  Surgeon: Shaaron Lamar HERO, MD;  Location: [facility];  Service: Endoscopy;  Laterality: N/A;  7:30 am, asa 3   ESOPHAGOGASTRODUODENOSCOPY (EGD) WITH PROPOFOL  N/A 03/20/2023   Procedure: ESOPHAGOGASTRODUODENOSCOPY (EGD) WITH PROPOFOL ;  Surgeon: Shaaron Lamar HERO, MD;  Location: [facility];  Service: Endoscopy;  Laterality: N/A;   INCISION AND DRAINAGE Right    hand   TOTAL KNEE ARTHROPLASTY Left 03/16/2020   Procedure: LEFT TOTAL KNEE ARTHROPLASTY;  Surgeon: Vernetta Lonni GRADE, MD;  Location: [facility];  Service: Orthopedics;  Laterality: Left;    Family History  Problem Relation Age of Onset   Cancer Father    Cancer Sister    Heart disease Paternal Grandmother    Colon cancer Neg Hx    Colon polyps Neg Hx     Social History  Socioeconomic History   Marital status: Married    Spouse name: Not on file   Number of children: 3   Years of education: Not on file   Highest education level: Not on file  Occupational History   Occupation: Self-employed- [employer]  Tobacco Use   Smoking status: Never   Smokeless tobacco: Never  Vaping Use   Vaping status: Never Used  Substance and Sexual Activity   Alcohol use: Not Currently    Comment: 12+ beer over a couple days on the weekend. May also drink during the week.   Drug use: Never   Sexual activity: Yes  Other Topics Concern   Not on file  Social  History Narrative   Not on file   Social Drivers of Health   Financial Resource Strain: Not on file  Food Insecurity: Not on file  Transportation Needs: Not on file  Physical Activity: Not on file  Stress: Not on file  Social Connections: Not on file  Intimate Partner Violence: Not on file    Outpatient Medications Prior to Visit  Medication Sig Dispense Refill   alfuzosin  (UROXATRAL ) 10 MG 24 hr tablet Take 1 tablet (10 mg total) by mouth at bedtime. 30 tablet 11   amLODipine  (NORVASC ) 10 MG tablet Take 1 tablet (10 mg total) by mouth daily. 90 tablet 1   celecoxib  (CELEBREX ) 100 MG capsule Take 1 capsule (100 mg total) by mouth 2 (two) times daily. 30 capsule 0   Cholecalciferol  (VITAMIN D -3) 125 MCG (5000 UT) TABS Take 5,000 Units by mouth daily.     cyclobenzaprine  (FLEXERIL ) 10 MG tablet Take 1 tablet (10 mg total) by mouth 2 (two) times daily as needed for muscle spasms. 30 tablet 0   esomeprazole  (NEXIUM ) 40 MG capsule TAKE (1) CAPSULE BY MOUTH ONCE DAILY. 30 capsule 5   lisinopril  (ZESTRIL ) 20 MG tablet TAKE 1 TABLET BY MOUTH ONCE DAILY 90 tablet 3   simvastatin  (ZOCOR ) 40 MG tablet TAKE (1) TABLET BY MOUTH AT BEDTIME. 30 tablet 0   tamsulosin  (FLOMAX ) 0.4 MG CAPS capsule Take 1 capsule (0.4 mg total) by mouth daily after supper. (Patient taking differently: Take 0.4 mg by mouth daily.) 30 capsule 11   ALPRAZolam  (XANAX ) 0.5 MG tablet Take 1 tablet (0.5 mg total) by mouth 2 (two) times daily as needed for anxiety. 30 tablet 0   fluticasone  (FLONASE ) 50 MCG/ACT nasal spray PLACE 2 SPRAYS INTO BOTH NOSTRILS ONCE DAILY. 16 g 0   No facility-administered medications prior to visit.    Allergies  Allergen Reactions   Morphine And Codeine Itching   Shrimp [Shellfish Allergy] Swelling    Eyes/Throat swelling    ROS Review of Systems  Constitutional:  Negative for chills and fever.  HENT:  Negative for congestion and sore throat.   Eyes:  Negative for pain and discharge.   Respiratory:  Negative for cough and shortness of breath.   Cardiovascular:  Positive for leg swelling. Negative for chest pain and palpitations.  Gastrointestinal:  Negative for diarrhea, nausea and vomiting.  Endocrine: Negative for polydipsia and polyuria.  Genitourinary:  Negative for dysuria and hematuria.       Groin pain  Musculoskeletal:  Positive for back pain. Negative for arthralgias, neck pain and neck stiffness.  Skin:  Negative for rash.  Neurological:  Negative for dizziness and weakness.  Psychiatric/Behavioral:  Negative for agitation and behavioral problems. The patient is nervous/anxious.       Objective:    Physical Exam  Vitals reviewed.  Constitutional:      General: He is not in acute distress.    Appearance: He is obese. He is not diaphoretic.  HENT:     Head: Normocephalic and atraumatic.     Right Ear: External ear normal. There is no impacted cerumen.     Left Ear: External ear normal. There is no impacted cerumen.     Nose: Nose normal.     Mouth/Throat:     Mouth: Mucous membranes are moist.  Eyes:     General: No scleral icterus.    Extraocular Movements: Extraocular movements intact.  Cardiovascular:     Rate and Rhythm: Normal rate and regular rhythm.     Heart sounds: Normal heart sounds. No murmur heard. Pulmonary:     Breath sounds: Normal breath sounds. No wheezing or rales.  Musculoskeletal:     Cervical back: Neck supple. No tenderness.     Lumbar back: Tenderness present.     Right lower leg: Edema (1+) present.     Left lower leg: Edema (1+) present.  Skin:    General: Skin is warm.     Findings: No rash.  Neurological:     General: No focal deficit present.     Mental Status: He is alert and oriented to person, place, and time.     Sensory: No sensory deficit.     Motor: No weakness.  Psychiatric:        Mood and Affect: Mood normal.        Behavior: Behavior normal.     BP (!) 144/86 (BP Location: Left Arm)   Pulse 81    Ht 6' 4 (1.93 m)   Wt 295 lb 12.8 oz (134.2 kg)   SpO2 97%   BMI 36.01 kg/m  Wt Readings from Last 3 Encounters:  05/09/24 295 lb 12.8 oz (134.2 kg)  03/20/23 291 lb 7.2 oz (132.2 kg)  03/19/23 291 lb 6.4 oz (132.2 kg)    Lab Results  Component Value Date   TSH 0.898 12/15/2022   Lab Results  Component Value Date   WBC 7.9 12/15/2022   HGB 15.7 12/15/2022   HCT 46.3 12/15/2022   MCV 90 12/15/2022   PLT 204 12/15/2022   Lab Results  Component Value Date   NA 140 12/15/2022   K 5.2 12/15/2022   CO2 23 12/15/2022   GLUCOSE 105 (H) 12/15/2022   BUN 21 12/15/2022   CREATININE 1.18 12/15/2022   BILITOT 0.3 12/15/2022   ALKPHOS 68 12/15/2022   AST 24 12/15/2022   ALT 23 12/15/2022   PROT 7.3 12/15/2022   ALBUMIN  4.6 12/15/2022   CALCIUM  10.0 12/15/2022   ANIONGAP 9 03/09/2020   EGFR 72 12/15/2022   Lab Results  Component Value Date   CHOL 181 12/15/2022   Lab Results  Component Value Date   HDL 66 12/15/2022   Lab Results  Component Value Date   LDLCALC 102 (H) 12/15/2022   Lab Results  Component Value Date   TRIG 70 12/15/2022   Lab Results  Component Value Date   CHOLHDL 2.7 12/15/2022   Lab Results  Component Value Date   HGBA1C 5.7 (H) 12/15/2022      Assessment & Plan:   Problem List Items Addressed This Visit       Cardiovascular and Mediastinum   HTN (hypertension) - Primary   BP Readings from Last 1 Encounters:  05/09/24 (!) 144/86    Uncontrolled with amlodipine  10 mg  QD and lisinopril  20 mg QD Has chronic leg swelling as well Added chlorthalidone  12.5 mg QD for now Counseled for compliance with the medications Advised DASH diet and moderate exercise/walking, at least 150 mins/week      Relevant Medications   chlorthalidone  (HYGROTON ) 25 MG tablet   Other Relevant Orders   TSH   CMP14+EGFR   CBC with Differential/Platelet     Respiratory   Allergic sinusitis   Well-controlled with Flonase , refilled      Relevant  Medications   fluticasone  (FLONASE ) 50 MCG/ACT nasal spray     Musculoskeletal and Integument   Epidermoid cyst of skin of back   About 5 cm in diameter lump on right sided upper back area Due to the size of the cyst, would prefer general surgery evaluation      Relevant Orders   Ambulatory referral to General Surgery     Genitourinary   Benign prostatic hyperplasia with urinary hesitancy   On Flomax  Checked PSA Followed by urology      Relevant Orders   PSA     Other   Panic disorder   Has had episodes of panic in the past in crowd and especially while in plane Usually better with Xanax  as needed -his recent episode of flushing and dyspnea could be from panic      Relevant Medications   ALPRAZolam  (XANAX ) 0.5 MG tablet   citalopram  (CELEXA ) 10 MG tablet   HLD (hyperlipidemia)   Relevant Medications   chlorthalidone  (HYGROTON ) 25 MG tablet   Other Relevant Orders   Lipid panel   Prediabetes   Lab Results  Component Value Date   HGBA1C 5.7 (H) 12/15/2022   Advised to follow DASH diet      Relevant Orders   Hemoglobin A1c   GAD (generalized anxiety disorder)   Uncontrolled recently Has had more anxiety spells leading to panic episodes at times as well Has Xanax  0.5 mg as needed Added Celexa  10 mg once daily Relaxation techniques advised      Relevant Medications   ALPRAZolam  (XANAX ) 0.5 MG tablet   citalopram  (CELEXA ) 10 MG tablet   Leg swelling   Bilateral leg swelling, likely due to chronic venous insufficiency Advised to perform leg elevation and use compression socks as tolerated Added chlorthalidone  for uncontrolled HTN, which can also help with leg swelling      Other Visit Diagnoses       Vitamin D  deficiency       Relevant Orders   VITAMIN D  25 Hydroxy (Vit-D Deficiency, Fractures)       Meds ordered this encounter  Medications   fluticasone  (FLONASE ) 50 MCG/ACT nasal spray    Sig: Place 2 sprays into both nostrils daily.    Dispense:   16 g    Refill:  2   ALPRAZolam  (XANAX ) 0.5 MG tablet    Sig: Take 1 tablet (0.5 mg total) by mouth 2 (two) times daily as needed for anxiety.    Dispense:  30 tablet    Refill:  0   citalopram  (CELEXA ) 10 MG tablet    Sig: Take 1 tablet (10 mg total) by mouth daily.    Dispense:  30 tablet    Refill:  3   chlorthalidone  (HYGROTON ) 25 MG tablet    Sig: Take 0.5 tablets (12.5 mg total) by mouth daily.    Dispense:  45 tablet    Refill:  1    Follow-up: Return in about 2 months (around 07/09/2024) for  HTN and GAD.    Suzzane MARLA Blanch, MD

## 2024-05-09 NOTE — Assessment & Plan Note (Signed)
 About 5 cm in diameter lump on right sided upper back area Due to the size of the cyst, would prefer general surgery evaluation

## 2024-05-09 NOTE — Assessment & Plan Note (Signed)
 Bilateral leg swelling, likely due to chronic venous insufficiency Advised to perform leg elevation and use compression socks as tolerated Added chlorthalidone  for uncontrolled HTN, which can also help with leg swelling

## 2024-05-27 DIAGNOSIS — R3911 Hesitancy of micturition: Secondary | ICD-10-CM | POA: Diagnosis not present

## 2024-05-27 DIAGNOSIS — I10 Essential (primary) hypertension: Secondary | ICD-10-CM | POA: Diagnosis not present

## 2024-05-27 DIAGNOSIS — E559 Vitamin D deficiency, unspecified: Secondary | ICD-10-CM | POA: Diagnosis not present

## 2024-05-27 DIAGNOSIS — R7303 Prediabetes: Secondary | ICD-10-CM | POA: Diagnosis not present

## 2024-05-27 DIAGNOSIS — E782 Mixed hyperlipidemia: Secondary | ICD-10-CM | POA: Diagnosis not present

## 2024-05-27 DIAGNOSIS — N401 Enlarged prostate with lower urinary tract symptoms: Secondary | ICD-10-CM | POA: Diagnosis not present

## 2024-05-28 LAB — CMP14+EGFR
ALT: 21 IU/L (ref 0–44)
AST: 21 IU/L (ref 0–40)
Albumin: 4.2 g/dL (ref 3.8–4.9)
Alkaline Phosphatase: 74 IU/L (ref 44–121)
BUN/Creatinine Ratio: 15 (ref 9–20)
BUN: 17 mg/dL (ref 6–24)
Bilirubin Total: 0.4 mg/dL (ref 0.0–1.2)
CO2: 23 mmol/L (ref 20–29)
Calcium: 9.3 mg/dL (ref 8.7–10.2)
Chloride: 98 mmol/L (ref 96–106)
Creatinine, Ser: 1.15 mg/dL (ref 0.76–1.27)
Globulin, Total: 2.8 g/dL (ref 1.5–4.5)
Glucose: 111 mg/dL — ABNORMAL HIGH (ref 70–99)
Potassium: 4.7 mmol/L (ref 3.5–5.2)
Sodium: 136 mmol/L (ref 134–144)
Total Protein: 7 g/dL (ref 6.0–8.5)
eGFR: 74 mL/min/1.73 (ref >59)

## 2024-05-28 LAB — CBC WITH DIFFERENTIAL/PLATELET
Basophils Absolute: 0.1 x10E3/uL (ref 0.0–0.2)
Basos: 1 %
EOS (ABSOLUTE): 0.3 x10E3/uL (ref 0.0–0.4)
Eos: 3 %
Hematocrit: 45.2 % (ref 37.5–51.0)
Hemoglobin: 15.2 g/dL (ref 13.0–17.7)
Immature Grans (Abs): 0 x10E3/uL (ref 0.0–0.1)
Immature Granulocytes: 0 %
Lymphocytes Absolute: 2.1 x10E3/uL (ref 0.7–3.1)
Lymphs: 26 %
MCH: 31.3 pg (ref 26.6–33.0)
MCHC: 33.6 g/dL (ref 31.5–35.7)
MCV: 93 fL (ref 79–97)
Monocytes Absolute: 0.7 x10E3/uL (ref 0.1–0.9)
Monocytes: 9 %
Neutrophils Absolute: 4.7 x10E3/uL (ref 1.4–7.0)
Neutrophils: 60 %
Platelets: 218 x10E3/uL (ref 150–450)
RBC: 4.86 x10E6/uL (ref 4.14–5.80)
RDW: 13.3 % (ref 11.6–15.4)
WBC: 7.9 x10E3/uL (ref 3.4–10.8)

## 2024-05-28 LAB — HEMOGLOBIN A1C
Est. average glucose Bld gHb Est-mCnc: 120 mg/dL
Hgb A1c MFr Bld: 5.8 % — ABNORMAL HIGH (ref 4.8–5.6)

## 2024-05-28 LAB — LIPID PANEL
Chol/HDL Ratio: 3.1 ratio (ref 0.0–5.0)
Cholesterol, Total: 180 mg/dL (ref 100–199)
HDL: 59 mg/dL (ref >39)
LDL Chol Calc (NIH): 99 mg/dL (ref 0–99)
Triglycerides: 126 mg/dL (ref 0–149)
VLDL Cholesterol Cal: 22 mg/dL (ref 5–40)

## 2024-05-28 LAB — PSA: Prostate Specific Ag, Serum: 1.4 ng/mL (ref 0.0–4.0)

## 2024-05-28 LAB — VITAMIN D 25 HYDROXY (VIT D DEFICIENCY, FRACTURES): Vit D, 25-Hydroxy: 47.4 ng/mL (ref 30.0–100.0)

## 2024-05-28 LAB — TSH: TSH: 0.803 u[IU]/mL (ref 0.450–4.500)

## 2024-05-29 ENCOUNTER — Other Ambulatory Visit: Payer: Self-pay | Admitting: Internal Medicine

## 2024-05-29 DIAGNOSIS — E782 Mixed hyperlipidemia: Secondary | ICD-10-CM

## 2024-05-30 ENCOUNTER — Ambulatory Visit: Payer: Self-pay | Admitting: Internal Medicine

## 2024-06-07 ENCOUNTER — Ambulatory Visit (INDEPENDENT_AMBULATORY_CARE_PROVIDER_SITE_OTHER): Admitting: General Surgery

## 2024-06-07 ENCOUNTER — Encounter: Payer: Self-pay | Admitting: General Surgery

## 2024-06-07 VITALS — BP 134/82 | HR 79 | Temp 98.0°F | Resp 16 | Ht 76.0 in | Wt 289.0 lb

## 2024-06-07 DIAGNOSIS — L723 Sebaceous cyst: Secondary | ICD-10-CM | POA: Diagnosis not present

## 2024-06-08 DIAGNOSIS — M5416 Radiculopathy, lumbar region: Secondary | ICD-10-CM | POA: Diagnosis not present

## 2024-06-08 NOTE — H&P (Signed)
 Victor Conner; [id number]; 1966/08/29   HPI Patient is a 58 year old white male who was referred to my care by Dr. Tobie for evaluation and treatment of a lump on the right side of his back.  He states has been present for some time, but is increased in size and does cause him discomfort.  No drainage has been noted. Past Medical History:  Diagnosis Date   Anxiety    Arthritis    left knee   GERD (gastroesophageal reflux disease)    Hypercholesteremia    Hypertension     Past Surgical History:  Procedure Laterality Date   ANTERIOR CERVICAL DECOMP/DISCECTOMY FUSION N/A 05/09/2019   Procedure: C4-5, C5-6 ANTERIOR CERVICAL DECOMPRESSION/DISCECTOMY FUSION, ALLOGRAFT, PLATE;  Surgeon: Barbarann Oneil BROCKS, MD;  Location: [facility];  Service: Orthopedics;  Laterality: N/A;   BONE CYST EXCISION     left leg   COLONOSCOPY WITH PROPOFOL  N/A 03/20/2023   Procedure: COLONOSCOPY WITH PROPOFOL ;  Surgeon: Shaaron Lamar HERO, MD;  Location: [facility];  Service: Endoscopy;  Laterality: N/A;  7:30 am, asa 3   ESOPHAGOGASTRODUODENOSCOPY (EGD) WITH PROPOFOL  N/A 03/20/2023   Procedure: ESOPHAGOGASTRODUODENOSCOPY (EGD) WITH PROPOFOL ;  Surgeon: Shaaron Lamar HERO, MD;  Location: [facility];  Service: Endoscopy;  Laterality: N/A;   INCISION AND DRAINAGE Right    hand   TOTAL KNEE ARTHROPLASTY Left 03/16/2020   Procedure: LEFT TOTAL KNEE ARTHROPLASTY;  Surgeon: Vernetta Lonni GRADE, MD;  Location: [facility];  Service: Orthopedics;  Laterality: Left;    Family History  Problem Relation Age of Onset   Cancer Father    Cancer Sister    Heart disease Paternal Grandmother    Colon cancer Neg Hx    Colon polyps Neg Hx     Current Outpatient Medications on File Prior to Visit  Medication Sig Dispense Refill   alfuzosin  (UROXATRAL ) 10 MG 24 hr tablet Take 1 tablet (10 mg total) by mouth at bedtime. 30 tablet 11   ALPRAZolam  (XANAX ) 0.5 MG tablet Take 1 tablet (0.5 mg total) by mouth 2 (two) times daily as needed for  anxiety. 30 tablet 0   amLODipine  (NORVASC ) 10 MG tablet Take 1 tablet (10 mg total) by mouth daily. 90 tablet 1   celecoxib  (CELEBREX ) 100 MG capsule Take 1 capsule (100 mg total) by mouth 2 (two) times daily. 30 capsule 0   chlorthalidone  (HYGROTON ) 25 MG tablet Take 0.5 tablets (12.5 mg total) by mouth daily. 45 tablet 1   Cholecalciferol  (VITAMIN D -3) 125 MCG (5000 UT) TABS Take 5,000 Units by mouth daily.     citalopram  (CELEXA ) 10 MG tablet Take 1 tablet (10 mg total) by mouth daily. 30 tablet 3   cyclobenzaprine  (FLEXERIL ) 10 MG tablet Take 1 tablet (10 mg total) by mouth 2 (two) times daily as needed for muscle spasms. 30 tablet 0   esomeprazole  (NEXIUM ) 40 MG capsule TAKE (1) CAPSULE BY MOUTH ONCE DAILY. 30 capsule 5   fluticasone  (FLONASE ) 50 MCG/ACT nasal spray Place 2 sprays into both nostrils daily. 16 g 2   lisinopril  (ZESTRIL ) 20 MG tablet TAKE 1 TABLET BY MOUTH ONCE DAILY 90 tablet 3   simvastatin  (ZOCOR ) 40 MG tablet TAKE (1) TABLET BY MOUTH AT BEDTIME. 30 tablet 0   tamsulosin  (FLOMAX ) 0.4 MG CAPS capsule Take 1 capsule (0.4 mg total) by mouth daily after supper. (Patient taking differently: Take 0.4 mg by mouth daily.) 30 capsule 11   No current facility-administered medications on file prior to visit.  Allergies  Allergen Reactions   Morphine And Codeine Itching   Shrimp [Shellfish Allergy] Swelling    Eyes/Throat swelling    Social History   Substance and Sexual Activity  Alcohol Use Not Currently   Comment: 12+ beer over a couple days on the weekend. May also drink during the week.    Social History   Tobacco Use  Smoking Status Never  Smokeless Tobacco Never    Review of Systems  Constitutional: Negative.   HENT: Negative.    Eyes: Negative.   Respiratory: Negative.    Cardiovascular: Negative.   Gastrointestinal: Negative.   Genitourinary: Negative.   Musculoskeletal: Negative.   Skin: Negative.   Neurological: Negative.   Endo/Heme/Allergies:  Negative.   Psychiatric/Behavioral: Negative.      Objective   Vitals:   06/07/24 1355  BP: 134/82  Pulse: 79  Resp: 16  Temp: 98 F (36.7 C)  SpO2: 94%    Physical Exam Vitals reviewed.  Constitutional:      Appearance: Normal appearance. He is not ill-appearing.  HENT:     Head: Normocephalic and atraumatic.  Cardiovascular:     Rate and Rhythm: Normal rate and regular rhythm.     Heart sounds: Normal heart sounds. No murmur heard.    No friction rub. No gallop.  Pulmonary:     Effort: Pulmonary effort is normal. No respiratory distress.     Breath sounds: Normal breath sounds. No stridor. No wheezing, rhonchi or rales.  Skin:    General: Skin is warm and dry.     Comments: A 3 cm sebaceous cyst is noted in the right mid back.  A punctum is present.  No drainage is noted.  Minimal erythema is present.  Neurological:     Mental Status: He is alert and oriented to person, place, and time.     Assessment  Sebaceous cyst, back Plan  Patient is scheduled for excision of the sebaceous cyst, back on 06/21/2024.  The risks and benefits of the procedure including bleeding, infection, and recurrence of the cyst were fully explained to the patient, who gave informed consent.

## 2024-06-08 NOTE — Progress Notes (Signed)
 AASIM Conner; [id number]; 1966/08/29   HPI Patient is a 58 year old white male who was referred to my care by Dr. Tobie for evaluation and treatment of a lump on the right side of his back.  He states has been present for some time, but is increased in size and does cause him discomfort.  No drainage has been noted. Past Medical History:  Diagnosis Date   Anxiety    Arthritis    left knee   GERD (gastroesophageal reflux disease)    Hypercholesteremia    Hypertension     Past Surgical History:  Procedure Laterality Date   ANTERIOR CERVICAL DECOMP/DISCECTOMY FUSION N/A 05/09/2019   Procedure: C4-5, C5-6 ANTERIOR CERVICAL DECOMPRESSION/DISCECTOMY FUSION, ALLOGRAFT, PLATE;  Surgeon: Barbarann Oneil BROCKS, MD;  Location: [facility];  Service: Orthopedics;  Laterality: N/A;   BONE CYST EXCISION     left leg   COLONOSCOPY WITH PROPOFOL  N/A 03/20/2023   Procedure: COLONOSCOPY WITH PROPOFOL ;  Surgeon: Shaaron Lamar HERO, MD;  Location: [facility];  Service: Endoscopy;  Laterality: N/A;  7:30 am, asa 3   ESOPHAGOGASTRODUODENOSCOPY (EGD) WITH PROPOFOL  N/A 03/20/2023   Procedure: ESOPHAGOGASTRODUODENOSCOPY (EGD) WITH PROPOFOL ;  Surgeon: Shaaron Lamar HERO, MD;  Location: [facility];  Service: Endoscopy;  Laterality: N/A;   INCISION AND DRAINAGE Right    hand   TOTAL KNEE ARTHROPLASTY Left 03/16/2020   Procedure: LEFT TOTAL KNEE ARTHROPLASTY;  Surgeon: Vernetta Lonni GRADE, MD;  Location: [facility];  Service: Orthopedics;  Laterality: Left;    Family History  Problem Relation Age of Onset   Cancer Father    Cancer Sister    Heart disease Paternal Grandmother    Colon cancer Neg Hx    Colon polyps Neg Hx     Current Outpatient Medications on File Prior to Visit  Medication Sig Dispense Refill   alfuzosin  (UROXATRAL ) 10 MG 24 hr tablet Take 1 tablet (10 mg total) by mouth at bedtime. 30 tablet 11   ALPRAZolam  (XANAX ) 0.5 MG tablet Take 1 tablet (0.5 mg total) by mouth 2 (two) times daily as needed for  anxiety. 30 tablet 0   amLODipine  (NORVASC ) 10 MG tablet Take 1 tablet (10 mg total) by mouth daily. 90 tablet 1   celecoxib  (CELEBREX ) 100 MG capsule Take 1 capsule (100 mg total) by mouth 2 (two) times daily. 30 capsule 0   chlorthalidone  (HYGROTON ) 25 MG tablet Take 0.5 tablets (12.5 mg total) by mouth daily. 45 tablet 1   Cholecalciferol  (VITAMIN D -3) 125 MCG (5000 UT) TABS Take 5,000 Units by mouth daily.     citalopram  (CELEXA ) 10 MG tablet Take 1 tablet (10 mg total) by mouth daily. 30 tablet 3   cyclobenzaprine  (FLEXERIL ) 10 MG tablet Take 1 tablet (10 mg total) by mouth 2 (two) times daily as needed for muscle spasms. 30 tablet 0   esomeprazole  (NEXIUM ) 40 MG capsule TAKE (1) CAPSULE BY MOUTH ONCE DAILY. 30 capsule 5   fluticasone  (FLONASE ) 50 MCG/ACT nasal spray Place 2 sprays into both nostrils daily. 16 g 2   lisinopril  (ZESTRIL ) 20 MG tablet TAKE 1 TABLET BY MOUTH ONCE DAILY 90 tablet 3   simvastatin  (ZOCOR ) 40 MG tablet TAKE (1) TABLET BY MOUTH AT BEDTIME. 30 tablet 0   tamsulosin  (FLOMAX ) 0.4 MG CAPS capsule Take 1 capsule (0.4 mg total) by mouth daily after supper. (Patient taking differently: Take 0.4 mg by mouth daily.) 30 capsule 11   No current facility-administered medications on file prior to visit.  Allergies  Allergen Reactions   Morphine And Codeine Itching   Shrimp [Shellfish Allergy] Swelling    Eyes/Throat swelling    Social History   Substance and Sexual Activity  Alcohol Use Not Currently   Comment: 12+ beer over a couple days on the weekend. May also drink during the week.    Social History   Tobacco Use  Smoking Status Never  Smokeless Tobacco Never    Review of Systems  Constitutional: Negative.   HENT: Negative.    Eyes: Negative.   Respiratory: Negative.    Cardiovascular: Negative.   Gastrointestinal: Negative.   Genitourinary: Negative.   Musculoskeletal: Negative.   Skin: Negative.   Neurological: Negative.   Endo/Heme/Allergies:  Negative.   Psychiatric/Behavioral: Negative.      Objective   Vitals:   06/07/24 1355  BP: 134/82  Pulse: 79  Resp: 16  Temp: 98 F (36.7 C)  SpO2: 94%    Physical Exam Vitals reviewed.  Constitutional:      Appearance: Normal appearance. He is not ill-appearing.  HENT:     Head: Normocephalic and atraumatic.  Cardiovascular:     Rate and Rhythm: Normal rate and regular rhythm.     Heart sounds: Normal heart sounds. No murmur heard.    No friction rub. No gallop.  Pulmonary:     Effort: Pulmonary effort is normal. No respiratory distress.     Breath sounds: Normal breath sounds. No stridor. No wheezing, rhonchi or rales.  Skin:    General: Skin is warm and dry.     Comments: A 3 cm sebaceous cyst is noted in the right mid back.  A punctum is present.  No drainage is noted.  Minimal erythema is present.  Neurological:     Mental Status: He is alert and oriented to person, place, and time.     Assessment  Sebaceous cyst, back Plan  Patient is scheduled for excision of the sebaceous cyst, back on 06/21/2024.  The risks and benefits of the procedure including bleeding, infection, and recurrence of the cyst were fully explained to the patient, who gave informed consent.

## 2024-06-15 ENCOUNTER — Telehealth: Payer: Self-pay | Admitting: *Deleted

## 2024-06-15 NOTE — Telephone Encounter (Signed)
 Surgical Date: 06/21/2024 Procedure: Excision, Mass, Back  Received call from patient [phone]~ telephone.   Patient reports inflammation to back at area of cyst like mass. Reports that area became inflamed on Saturday, 06/11/2024. States that area is painful, red, warm to touch. Reports area has opened and is draining.  Discussed with Dr. Mavis. Recommended appointment for evaluation, and possible I&D.  Call placed to patient and patient made aware. Appointment scheduled.

## 2024-06-16 ENCOUNTER — Ambulatory Visit: Admitting: General Surgery

## 2024-06-16 ENCOUNTER — Encounter: Payer: Self-pay | Admitting: General Surgery

## 2024-06-16 VITALS — BP 133/86 | HR 75 | Temp 98.1°F | Resp 16 | Ht 76.0 in | Wt 285.0 lb

## 2024-06-16 DIAGNOSIS — L723 Sebaceous cyst: Secondary | ICD-10-CM | POA: Diagnosis not present

## 2024-06-16 MED ORDER — DOXYCYCLINE HYCLATE 100 MG PO CAPS: 100.0000 mg | ORAL_CAPSULE | Freq: Two times a day (BID) | ORAL | 1 refills | Status: DC

## 2024-06-16 NOTE — Patient Instructions (Signed)
 Victor Conner  06/16/2024     @PREFPERIOPPHARMACY @   Your procedure is scheduled on  06/21/2024.   [facility] to [facility] at  0710  A.M.   Call this number if you have problems the morning of surgery:  [phone]  If you experience any cold or flu symptoms such as cough, fever, chills, shortness of breath, etc. between now and your scheduled surgery, please notify us  at the above number.   Remember:  Do not eat after midnight.   You may drink clear liquids until 0510 am on 06/21/2024.    Clear liquids allowed are:                    Water , Juice (No red color; non-citric and without pulp; diabetics please choose diet or no sugar options), Carbonated beverages (diabetics please choose diet or no sugar options), Clear Tea (No creamer, milk, or cream, including half & half and powdered creamer), Black Coffee Only (No creamer, milk or cream, including half & half and powdered creamer), and Clear Sports drink (No red color; diabetics please choose diet or no sugar options)    Take these medicines the morning of surgery with A SIP OF WATER        alparazolam(if needed), amlodipine , citalopram , esomeprazole .    Do not wear jewelry, make-up or nail polish, including gel polish,  artificial nails, or any other type of covering on natural nails (fingers and  toes).  Do not wear lotions, powders, or perfumes, or deodorant.  Do not shave 48 hours prior to surgery.  Men may shave face and neck.  Do not bring valuables to the hospital.  [facility] is not responsible for any belongings or valuables.  Contacts, dentures or bridgework may not be worn into surgery.  Leave your suitcase in the car.  After surgery it may be brought to your room.  For patients admitted to the hospital, discharge time will be determined by your treatment team.  Patients discharged the day of surgery will not be allowed to drive home and must have someone with them for 24 hours.    Special instructions:    DO NOT smoke tobacco or vape for 24 hours before your procedure.  Please read over the following fact sheets that you were given. Coughing and Deep Breathing, Surgical Site Infection Prevention, Anesthesia Post-op Instructions, and Care and Recovery After Surgery        Removing a Pocket of Fluid in the Skin (Epidermoid Cyst Removal): What to Know After After having the pocket of fluid, also called an epidermoid cyst, removed, it's common to have soreness at the site where the cyst used to be. You may also feel tightness or itchiness due to stitches. Follow these instructions at home: Medicines Take your medicines only as told. If you were given an antibiotic medicine or ointment, take or use it as told. Do not stop using them sooner even if you start to feel better. Incision care  Take care of your cut as told. Make sure you: Wash your hands with soap and water  for at least 20 seconds before and after you change your bandage. If you can't use soap and water , use hand sanitizer. Change your bandage. Leave stitches alone. Keep the bandage dry until your health care provider says that it can be removed. After your bandage is off, check the area around your cut every day for signs of infection. Check for: Redness, swelling, or pain.  Fluid or blood. Warmth. Pus or a bad smell. General instructions Do not take baths, swim, or use a hot tub until you're told it's OK. Ask if you can shower. Avoid contact sports or activities that take a lot of effort. These things maystretch or put pressure on your cut area. Return to your normal diet. Keep all follow-up visits. Your provider may need to make sure your wound is healing. Contact a health care provider if: You have a fever. You have any signs of infection. Your cyst grows back. Get help right away if: Your cut from surgery suddenly gets bigger and you have pain. This information is not intended to replace advice given to you by your  health care provider. Make sure you discuss any questions you have with your health care provider. Document Revised: 04/24/2023 Document Reviewed: 04/24/2023 Elsevier Patient Education  2024 Elsevier Inc.General Anesthesia, Adult, Care After The following information offers guidance on how to care for yourself after your procedure. Your health care provider may also give you more specific instructions. If you have problems or questions, contact your health care provider. What can I expect after the procedure? After the procedure, it is common for people to: Have pain or discomfort at the IV site. Have nausea or vomiting. Have a sore throat or hoarseness. Have trouble concentrating. Feel cold or chills. Feel weak, sleepy, or tired (fatigue). Have soreness and body aches. These can affect parts of the body that were not involved in surgery. Follow these instructions at home: For the time period you were told by your health care provider:  Rest. Do not participate in activities where you could fall or become injured. Do not drive or use machinery. Do not drink alcohol. Do not take sleeping pills or medicines that cause drowsiness. Do not make important decisions or sign legal documents. Do not take care of children on your own. General instructions Drink enough fluid to keep your urine pale yellow. If you have sleep apnea, surgery and certain medicines can increase your risk for breathing problems. Follow instructions from your health care provider about wearing your sleep device: Anytime you are sleeping, including during daytime naps. While taking prescription pain medicines, sleeping medicines, or medicines that make you drowsy. Return to your normal activities as told by your health care provider. Ask your health care provider what activities are safe for you. Take over-the-counter and prescription medicines only as told by your health care provider. Do not use any products that  contain nicotine or tobacco. These products include cigarettes, chewing tobacco, and vaping devices, such as e-cigarettes. These can delay incision healing after surgery. If you need help quitting, ask your health care provider. Contact a health care provider if: You have nausea or vomiting that does not get better with medicine. You vomit every time you eat or drink. You have pain that does not get better with medicine. You cannot urinate or have bloody urine. You develop a skin rash. You have a fever. Get help right away if: You have trouble breathing. You have chest pain. You vomit blood. These symptoms may be an emergency. Get help right away. Call 911. Do not wait to see if the symptoms will go away. Do not drive yourself to the hospital. Summary After the procedure, it is common to have a sore throat, hoarseness, nausea, vomiting, or to feel weak, sleepy, or fatigue. For the time period you were told by your health care provider, do not drive or use machinery. Get  help right away if you have difficulty breathing, have chest pain, or vomit blood. These symptoms may be an emergency. This information is not intended to replace advice given to you by your health care provider. Make sure you discuss any questions you have with your health care provider. Document Revised: 12/06/2021 Document Reviewed: 12/06/2021 Elsevier Patient Education  2024 Elsevier Inc.How to Use Chlorhexidine  at Home in the Shower Chlorhexidine  gluconate (CHG) is a germ-killing (antiseptic) wash that's used to clean the skin. It can get rid of the germs that normally live on the skin and can keep them away for about 24 hours. If you're having surgery, you may be told to shower with CHG at home the night before surgery. This can help lower your risk for infection. To use CHG wash in the shower, follow the steps below. Supplies needed: CHG body wash. Clean washcloth. Clean towel. How to use CHG in the shower Follow  these steps unless you're told to use CHG in a different way: Start the shower. Use your normal soap and shampoo to wash your face and hair. Turn off the shower or move out of the shower stream. Pour CHG onto a clean washcloth. Do not use any type of brush or rough sponge. Start at your neck, washing your body down to your toes. Make sure you: Wash the part of your body where the surgery will be done for at least 1 minute. Do not scrub. Do not use CHG on your head or face unless your health care provider tells you to. If it gets into your ears or eyes, rinse them well with water . Do not wash your genitals with CHG. Wash your back and under your arms. Make sure to wash skin folds. Let the CHG sit on your skin for 1-2 minutes or as long as told. Rinse your entire body in the shower, including all body creases and folds. Turn off the shower. Dry off with a clean towel. Do not put anything on your skin afterward, such as powder, lotion, or perfume. Put on clean clothes or pajamas. If it's the night before surgery, sleep in clean sheets. General tips Use CHG only as told, and follow the instructions on the label. Use the full amount of CHG as told. This is often one bottle. Do not smoke and stay away from flames after using CHG. Your skin may feel sticky after using CHG. This is normal. The sticky feeling will go away as the CHG dries. Do not use CHG: If you have a chlorhexidine  allergy or have reacted to chlorhexidine  in the past. On open wounds or areas of skin that have broken skin, cuts, or scrapes. On babies younger than 76 months of age. Contact a health care provider if: You have questions about using CHG. Your skin gets irritated or itchy. You have a rash after using CHG. You swallow any CHG. Call your local poison control center [phone] in the [place] Your eyes itch badly, or they become very red or swollen. Your hearing changes. You have trouble seeing. If you can't reach  your provider, go to an urgent care or emergency room. Do not drive yourself. Get help right away if: You have swelling or tingling in your mouth or throat. You make high-pitched whistling sounds when you breathe, most often when you breathe out (wheeze). You have trouble breathing. These symptoms may be an emergency. Call 911 right away. Do not wait to see if the symptoms will go away. Do not drive  yourself to the hospital. This information is not intended to replace advice given to you by your health care provider. Make sure you discuss any questions you have with your health care provider. Document Revised: 03/24/2023 Document Reviewed: 03/20/2022 Elsevier Patient Education  2024 [employer].

## 2024-06-17 ENCOUNTER — Encounter (HOSPITAL_COMMUNITY): Payer: Self-pay

## 2024-06-17 ENCOUNTER — Encounter (HOSPITAL_COMMUNITY)
Admission: RE | Admit: 2024-06-17 | Discharge: 2024-06-17 | Disposition: A | Discharge status: Home / Self Care | Source: Ambulatory Visit | Attending: General Surgery | Admitting: General Surgery

## 2024-06-17 DIAGNOSIS — I10 Essential (primary) hypertension: Secondary | ICD-10-CM

## 2024-06-17 NOTE — Progress Notes (Signed)
 Subjective:     Victor Conner  Patient presents back for evaluation of his sebaceous cyst on his back.  It started draining purulent fluid it this weekend.  He was scheduled for excision of the cyst next week. Objective:    BP 133/86   Pulse 75   Temp 98.1 F (36.7 C) (Oral)   Resp 16   Ht 6' 4 (1.93 m)   Wt 285 lb (129.3 kg)   SpO2 94%   BMI 34.69 kg/m   General:  alert, cooperative, and no distress  Back: Large draining purulent sebaceous cyst is noted on his back.  I was able to express purulent fluid and some sebum.     Assessment:    Infected sebaceous cyst, back    Plan:   I have canceled his surgery for next week.  I have started doxycycline  x 1 week.  Patient should clean the wound daily with soap and water .  See the patient back in the office next week for a wound check.

## 2024-06-21 ENCOUNTER — Encounter: Payer: Self-pay | Admitting: General Surgery

## 2024-06-21 ENCOUNTER — Ambulatory Visit (HOSPITAL_COMMUNITY): Admission: RE | Admit: 2024-06-21 | Source: Home / Self Care | Admitting: General Surgery

## 2024-06-21 ENCOUNTER — Ambulatory Visit: Admitting: General Surgery

## 2024-06-21 ENCOUNTER — Encounter (HOSPITAL_COMMUNITY): Admission: RE | Payer: Self-pay | Source: Home / Self Care

## 2024-06-21 VITALS — BP 166/82 | HR 75 | Temp 97.8°F | Resp 14 | Ht 76.0 in | Wt 282.0 lb

## 2024-06-21 DIAGNOSIS — L723 Sebaceous cyst: Secondary | ICD-10-CM

## 2024-06-21 SURGERY — EXCISION, MASS, UPPER EXTREMITY
Anesthesia: Choice | Laterality: Right

## 2024-06-21 NOTE — Progress Notes (Signed)
 Subjective:     Victor Conner  Here for follow-up visit.  Had infected sebaceous cyst on his back.  He states that less pus and sebum are coming out over the past few days.  He is finishing up his antibiotic course.  Less pain is noted. Objective:    BP (!) 166/82   Pulse 75   Temp 97.8 F (36.6 C) (Oral)   Resp 14   Ht 6' 4 (1.93 m)   Wt 282 lb (127.9 kg)   SpO2 96%   BMI 34.33 kg/m   General:  alert, cooperative, and no distress  Back: The infected sebaceous cyst is much improved.  It is smaller.  He does have superficial granulation tissue present.  No sebum or purulent fluid was expressed.     Assessment:    Infected sebaceous cyst, back, resolving.  No need for acute surgical invention at the present time as the wound is healing well.    Plan:   I told them the continue wound care daily as able.  Will return in approximately 4 to 6 weeks to assess whether or not formal surgical excision is needed.  They understand and agree.

## 2024-06-24 ENCOUNTER — Other Ambulatory Visit: Payer: Self-pay | Admitting: General Surgery

## 2024-07-05 ENCOUNTER — Other Ambulatory Visit: Payer: Self-pay | Admitting: Internal Medicine

## 2024-07-05 DIAGNOSIS — E782 Mixed hyperlipidemia: Secondary | ICD-10-CM

## 2024-07-14 ENCOUNTER — Other Ambulatory Visit: Payer: Self-pay | Admitting: Internal Medicine

## 2024-07-14 DIAGNOSIS — K219 Gastro-esophageal reflux disease without esophagitis: Secondary | ICD-10-CM

## 2024-07-18 ENCOUNTER — Ambulatory Visit: Admitting: Internal Medicine

## 2024-07-18 ENCOUNTER — Encounter: Payer: Self-pay | Admitting: Internal Medicine

## 2024-07-18 VITALS — BP 136/74 | HR 77 | Ht 76.0 in | Wt 290.6 lb

## 2024-07-18 DIAGNOSIS — E669 Obesity, unspecified: Secondary | ICD-10-CM

## 2024-07-18 DIAGNOSIS — I10 Essential (primary) hypertension: Secondary | ICD-10-CM

## 2024-07-18 DIAGNOSIS — Z0001 Encounter for general adult medical examination with abnormal findings: Secondary | ICD-10-CM

## 2024-07-18 DIAGNOSIS — F411 Generalized anxiety disorder: Secondary | ICD-10-CM

## 2024-07-18 DIAGNOSIS — E782 Mixed hyperlipidemia: Secondary | ICD-10-CM | POA: Diagnosis not present

## 2024-07-18 MED ORDER — TIRZEPATIDE-WEIGHT MANAGEMENT 2.5 MG/0.5ML ~~LOC~~ SOLN: 2.5000 mg | SUBCUTANEOUS | 0 refills | Status: DC

## 2024-07-18 NOTE — Patient Instructions (Signed)
 Please start taking Zepbound as prescribed.  Please continue to take medications as prescribed.  Please continue to follow low carb diet and perform moderate exercise/walking at least 150 mins/week.  Please get blood tests done before the next visit.

## 2024-07-18 NOTE — Assessment & Plan Note (Signed)
 Uncontrolled recently Has had more anxiety spells leading to panic episodes at times as well Has Xanax  0.5 mg as needed Added Celexa  10 mg once daily Relaxation techniques advised

## 2024-07-18 NOTE — Progress Notes (Unsigned)
 Established Patient Office Visit  Subjective:  Patient ID: Victor Conner, male    DOB: 08/15/1966  Age: 58 y.o. MRN: [id number]  CC:  Chief Complaint  Patient presents with   Hypertension    Follow up   Anxiety    Follow up    HPI Victor Conner is a 58 y.o. male with past medical history of HTN, GERD, HLD, cervical spinal stenosis and panic disorder who presents for annual physical.  HTN: BP is elevated today. His blood pressure has been borderline elevated at home - around 140s/70s. Takes amlodipine  10 mg QD and lisinopril  20 mg QD regularly. Patient denies headache, dizziness, chest pain, dyspnea or palpitations.  He reports chronic leg swelling, especially with prolonged standing at his workplace.  GERD: He is currently taking Nexium  40 mg QD currently.  Denies any nausea or vomiting currently.  Denies any melena or hematochezia currently.  Panic disorder: He has had episodes of panic in crowded places and while flying.  He feels better with Xanax  usually, but takes it rarely.  He reports having panic episodes at nighttime, when he suddenly wakes up with palpitations, dyspnea and excessive sweating.  He reports improvement in symptoms with Xanax .  His wife reports that he has been having more frequent anxiety spells recently.  Denies anhedonia, insomnia, SI or HI currently.  Left ear discomfort: She reports intermittent left ear discomfort and tinnitus.  He has history of impacted earwax, which has required ear irrigation.  Denies any ear discharge, fever or chills.  His ear exam is benign today, he agrees to use Debrox eardrops as needed for excess earwax.  He also reports a lump on his upper back on the right side.  He has a history of a smaller cyst, which has been growing in size and has mild soreness upon pressure while he sits and takes back support.  Denies any local erythema, surrounding swelling or discharge.      Past Medical History:  Diagnosis Date   Anxiety     Arthritis    left knee   GERD (gastroesophageal reflux disease)    Hypercholesteremia    Hypertension     Past Surgical History:  Procedure Laterality Date   ANTERIOR CERVICAL DECOMP/DISCECTOMY FUSION N/A 05/09/2019   Procedure: C4-5, C5-6 ANTERIOR CERVICAL DECOMPRESSION/DISCECTOMY FUSION, ALLOGRAFT, PLATE;  Surgeon: Barbarann Oneil BROCKS, MD;  Location: [facility] OR;  Service: Orthopedics;  Laterality: N/A;   BONE CYST EXCISION     left leg   COLONOSCOPY WITH PROPOFOL  N/A 03/20/2023   Procedure: COLONOSCOPY WITH PROPOFOL ;  Surgeon: Shaaron Lamar HERO, MD;  Location: [facility];  Service: Endoscopy;  Laterality: N/A;  7:30 am, asa 3   ESOPHAGOGASTRODUODENOSCOPY (EGD) WITH PROPOFOL  N/A 03/20/2023   Procedure: ESOPHAGOGASTRODUODENOSCOPY (EGD) WITH PROPOFOL ;  Surgeon: Shaaron Lamar HERO, MD;  Location: [facility];  Service: Endoscopy;  Laterality: N/A;   INCISION AND DRAINAGE Right    hand   TOTAL KNEE ARTHROPLASTY Left 03/16/2020   Procedure: LEFT TOTAL KNEE ARTHROPLASTY;  Surgeon: Vernetta Lonni GRADE, MD;  Location: [facility];  Service: Orthopedics;  Laterality: Left;    Family History  Problem Relation Age of Onset   Cancer Father    Cancer Sister    Heart disease Paternal Grandmother    Colon cancer Neg Hx    Colon polyps Neg Hx     Social History   Socioeconomic History   Marital status: Married    Spouse name: Not on file  Number of children: 3   Years of education: Not on file   Highest education level: Not on file  Occupational History   Occupation: Self-employed- [employer]  Tobacco Use   Smoking status: Never   Smokeless tobacco: Never  Vaping Use   Vaping status: Never Used  Substance and Sexual Activity   Alcohol use: Not Currently    Comment: 12+ beer over a couple days on the weekend. May also drink during the week.   Drug use: Never   Sexual activity: Yes  Other Topics Concern   Not on file  Social History Narrative   Not on file   Social Drivers of Health    Financial Resource Strain: Not on file  Food Insecurity: Not on file  Transportation Needs: Not on file  Physical Activity: Not on file  Stress: Not on file  Social Connections: Not on file  Intimate Partner Violence: Not on file    Outpatient Medications Prior to Visit  Medication Sig Dispense Refill   ALPRAZolam  (XANAX ) 0.5 MG tablet Take 1 tablet (0.5 mg total) by mouth 2 (two) times daily as needed for anxiety. 30 tablet 0   amLODipine  (NORVASC ) 10 MG tablet Take 1 tablet (10 mg total) by mouth daily. 90 tablet 1   chlorthalidone  (HYGROTON ) 25 MG tablet Take 0.5 tablets (12.5 mg total) by mouth daily. 45 tablet 1   citalopram  (CELEXA ) 10 MG tablet Take 1 tablet (10 mg total) by mouth daily. 30 tablet 3   doxycycline  (VIBRAMYCIN ) 100 MG capsule Take 1 capsule (100 mg total) by mouth 2 (two) times daily. 14 capsule 1   esomeprazole  (NEXIUM ) 40 MG capsule TAKE (1) CAPSULE BY MOUTH ONCE DAILY. 30 capsule 5   fluticasone  (FLONASE ) 50 MCG/ACT nasal spray Place 2 sprays into both nostrils daily. 16 g 2   lisinopril  (ZESTRIL ) 20 MG tablet TAKE 1 TABLET BY MOUTH ONCE DAILY 90 tablet 3   naproxen sodium (ALEVE) 220 MG tablet Take 660 mg by mouth daily as needed (pain.).     simvastatin  (ZOCOR ) 40 MG tablet TAKE (1) TABLET BY MOUTH AT BEDTIME. 30 tablet 0   Vitamin D -Vitamin K (D3 + K2 PO) Take 1 tablet by mouth in the morning.     No facility-administered medications prior to visit.    Allergies  Allergen Reactions   Morphine And Codeine Itching   Shrimp [Shellfish Allergy] Swelling    Eyes/Throat swelling    ROS Review of Systems  Constitutional:  Negative for chills and fever.  HENT:  Negative for congestion and sore throat.   Eyes:  Negative for pain and discharge.  Respiratory:  Negative for cough and shortness of breath.   Cardiovascular:  Positive for leg swelling. Negative for chest pain and palpitations.  Gastrointestinal:  Negative for diarrhea, nausea and vomiting.   Endocrine: Negative for polydipsia and polyuria.  Genitourinary:  Negative for dysuria and hematuria.       Groin pain  Musculoskeletal:  Positive for back pain. Negative for arthralgias, neck pain and neck stiffness.  Skin:  Negative for rash.  Neurological:  Negative for dizziness and weakness.  Psychiatric/Behavioral:  Negative for agitation and behavioral problems. The patient is nervous/anxious.       Objective:    Physical Exam Vitals reviewed.  Constitutional:      General: He is not in acute distress.    Appearance: He is obese. He is not diaphoretic.  HENT:     Head: Normocephalic and atraumatic.  Right Ear: External ear normal. There is no impacted cerumen.     Left Ear: External ear normal. There is no impacted cerumen.     Nose: Nose normal.     Mouth/Throat:     Mouth: Mucous membranes are moist.  Eyes:     General: No scleral icterus.    Extraocular Movements: Extraocular movements intact.  Cardiovascular:     Rate and Rhythm: Normal rate and regular rhythm.     Heart sounds: Normal heart sounds. No murmur heard. Pulmonary:     Breath sounds: Normal breath sounds. No wheezing or rales.  Musculoskeletal:     Cervical back: Neck supple. No tenderness.     Lumbar back: Tenderness present.     Right lower leg: Edema (1+) present.     Left lower leg: Edema (1+) present.  Skin:    General: Skin is warm.     Findings: No rash.  Neurological:     General: No focal deficit present.     Mental Status: He is alert and oriented to person, place, and time.     Sensory: No sensory deficit.     Motor: No weakness.  Psychiatric:        Mood and Affect: Mood normal.        Behavior: Behavior normal.     BP (!) 148/78 (BP Location: Right Arm)   Pulse 77   Ht 6' 4 (1.93 m)   Wt 290 lb 9.6 oz (131.8 kg)   SpO2 98%   BMI 35.37 kg/m  Wt Readings from Last 3 Encounters:  07/18/24 290 lb 9.6 oz (131.8 kg)  06/21/24 282 lb (127.9 kg)  06/16/24 285 lb (129.3 kg)     Lab Results  Component Value Date   TSH 0.803 05/27/2024   Lab Results  Component Value Date   WBC 7.9 05/27/2024   HGB 15.2 05/27/2024   HCT 45.2 05/27/2024   MCV 93 05/27/2024   PLT 218 05/27/2024   Lab Results  Component Value Date   NA 136 05/27/2024   K 4.7 05/27/2024   CO2 23 05/27/2024   GLUCOSE 111 (H) 05/27/2024   BUN 17 05/27/2024   CREATININE 1.15 05/27/2024   BILITOT 0.4 05/27/2024   ALKPHOS 74 05/27/2024   AST 21 05/27/2024   ALT 21 05/27/2024   PROT 7.0 05/27/2024   ALBUMIN  4.2 05/27/2024   CALCIUM  9.3 05/27/2024   ANIONGAP 9 03/09/2020   EGFR 74 05/27/2024   Lab Results  Component Value Date   CHOL 180 05/27/2024   Lab Results  Component Value Date   HDL 59 05/27/2024   Lab Results  Component Value Date   LDLCALC 99 05/27/2024   Lab Results  Component Value Date   TRIG 126 05/27/2024   Lab Results  Component Value Date   CHOLHDL 3.1 05/27/2024   Lab Results  Component Value Date   HGBA1C 5.8 (H) 05/27/2024      Assessment & Plan:   Problem List Items Addressed This Visit   None    No orders of the defined types were placed in this encounter.   Follow-up: No follow-ups on file.    Suzzane MARLA Blanch, MD

## 2024-07-21 DIAGNOSIS — E669 Obesity, unspecified: Secondary | ICD-10-CM | POA: Insufficient documentation

## 2024-07-21 NOTE — Assessment & Plan Note (Signed)
 BMI Readings from Last 3 Encounters:  07/18/24 35.37 kg/m  06/21/24 34.33 kg/m  06/16/24 34.69 kg/m   Advised to continue to follow low-carb diet and perform moderate exercise/walking at least 150 minutes/week Discussed about medical weight loss options, including benefits and side effects He would benefit from GIP/GLP-1 agonist therapy Started Zepbound-initial BMI: 35.37, plan to increase dose as tolerated

## 2024-07-21 NOTE — Assessment & Plan Note (Signed)
 Physical exam as documented. Counseling done  re healthy lifestyle involving commitment to 150 minutes exercise per week, heart healthy diet, and attaining healthy weight.The importance of adequate sleep also discussed. Immunization and cancer screening needs are specifically addressed at this visit.

## 2024-07-21 NOTE — Assessment & Plan Note (Signed)
 BP Readings from Last 1 Encounters:  07/18/24 136/74   Well-controlled with amlodipine  10 mg QD, lisinopril  20 mg QD and  chlorthalidone  12.5 mg QD for now Counseled for compliance with the medications Advised DASH diet and moderate exercise/walking, at least 150 mins/week

## 2024-07-21 NOTE — Assessment & Plan Note (Signed)
On simvastatin Checked lipid profile

## 2024-07-28 DIAGNOSIS — M5416 Radiculopathy, lumbar region: Secondary | ICD-10-CM | POA: Diagnosis not present

## 2024-08-05 ENCOUNTER — Other Ambulatory Visit: Payer: Self-pay | Admitting: Internal Medicine

## 2024-08-05 DIAGNOSIS — E669 Obesity, unspecified: Secondary | ICD-10-CM

## 2024-08-12 NOTE — Telephone Encounter (Signed)
 Pt called reporting that he is completely out of his current supply and needs a refill, please advise

## 2024-08-16 ENCOUNTER — Other Ambulatory Visit: Payer: Self-pay | Admitting: Internal Medicine

## 2024-08-16 ENCOUNTER — Telehealth: Payer: Self-pay | Admitting: Internal Medicine

## 2024-08-16 DIAGNOSIS — E669 Obesity, unspecified: Secondary | ICD-10-CM

## 2024-08-16 MED ORDER — TIRZEPATIDE-WEIGHT MANAGEMENT 2.5 MG/0.5ML ~~LOC~~ SOLN: 2.5000 mg | SUBCUTANEOUS | 0 refills | Status: DC

## 2024-08-16 NOTE — Telephone Encounter (Signed)
Pt informed/verbalized understanding

## 2024-08-16 NOTE — Telephone Encounter (Signed)
 Patient came by the office and needs refill on tirzepatide  (ZEPBOUND ) 2.5 MG/0.5ML injection vial [[id number]]   Needs a prior authorization per patient.  Pharmacy  [facility] [place], [place] - [address] [phone], [place] [id number] Phone: [phone]  Fax: [phone] DEA #: --

## 2024-08-17 ENCOUNTER — Other Ambulatory Visit: Payer: Self-pay | Admitting: Internal Medicine

## 2024-08-17 DIAGNOSIS — I10 Essential (primary) hypertension: Secondary | ICD-10-CM

## 2024-08-22 DIAGNOSIS — I10 Essential (primary) hypertension: Secondary | ICD-10-CM | POA: Diagnosis not present

## 2024-08-23 LAB — CMP14+EGFR
ALT: 18 IU/L (ref 0–44)
AST: 21 IU/L (ref 0–40)
Albumin: 4.3 g/dL (ref 3.8–4.9)
Alkaline Phosphatase: 69 IU/L (ref 47–123)
BUN/Creatinine Ratio: 12 (ref 9–20)
BUN: 13 mg/dL (ref 6–24)
Bilirubin Total: 0.3 mg/dL (ref 0.0–1.2)
CO2: 21 mmol/L (ref 20–29)
Calcium: 9.8 mg/dL (ref 8.7–10.2)
Chloride: 96 mmol/L (ref 96–106)
Creatinine, Ser: 1.05 mg/dL (ref 0.76–1.27)
Globulin, Total: 2.9 g/dL (ref 1.5–4.5)
Glucose: 85 mg/dL (ref 70–99)
Potassium: 4.2 mmol/L (ref 3.5–5.2)
Sodium: 136 mmol/L (ref 134–144)
Total Protein: 7.2 g/dL (ref 6.0–8.5)
eGFR: 83 mL/min/1.73 (ref >59)

## 2024-08-24 ENCOUNTER — Ambulatory Visit: Admitting: Internal Medicine

## 2024-08-24 ENCOUNTER — Encounter: Payer: Self-pay | Admitting: Internal Medicine

## 2024-08-24 VITALS — BP 134/70 | HR 87 | Ht 76.0 in | Wt 283.8 lb

## 2024-08-24 DIAGNOSIS — E669 Obesity, unspecified: Secondary | ICD-10-CM

## 2024-08-24 DIAGNOSIS — I10 Essential (primary) hypertension: Secondary | ICD-10-CM | POA: Diagnosis not present

## 2024-08-24 DIAGNOSIS — R7303 Prediabetes: Secondary | ICD-10-CM | POA: Diagnosis not present

## 2024-08-24 DIAGNOSIS — R110 Nausea: Secondary | ICD-10-CM | POA: Diagnosis not present

## 2024-08-24 DIAGNOSIS — F411 Generalized anxiety disorder: Secondary | ICD-10-CM

## 2024-08-24 DIAGNOSIS — K219 Gastro-esophageal reflux disease without esophagitis: Secondary | ICD-10-CM

## 2024-08-24 MED ORDER — CITALOPRAM HYDROBROMIDE 10 MG PO TABS: 10.0000 mg | ORAL_TABLET | Freq: Every day | ORAL | 1 refills | Status: AC

## 2024-08-24 MED ORDER — TIRZEPATIDE-WEIGHT MANAGEMENT 10 MG/0.5ML ~~LOC~~ SOLN: 10.0000 mg | SUBCUTANEOUS | 2 refills | Status: DC

## 2024-08-24 MED ORDER — ONDANSETRON HCL 4 MG PO TABS: 4.0000 mg | ORAL_TABLET | Freq: Three times a day (TID) | ORAL | 0 refills | Status: DC | PRN

## 2024-08-24 NOTE — Assessment & Plan Note (Signed)
 BP Readings from Last 1 Encounters:  08/24/24 134/70   Well-controlled with amlodipine  10 mg QD, lisinopril  20 mg QD and  chlorthalidone  12.5 mg QD for now Counseled for compliance with the medications Advised DASH diet and moderate exercise/walking, at least 150 mins/week

## 2024-08-24 NOTE — Progress Notes (Signed)
 Established Patient Office Visit  Subjective:  Patient ID: Victor Conner, male    DOB: September 09, 1966  Age: 58 y.o. MRN: [id number]  CC:  Chief Complaint  Patient presents with   Hypertension    2 month f/u    Obesity    2 month f/u     HPI Victor Conner is a 58 y.o. male with past medical history of HTN, GERD, HLD, cervical spinal stenosis and panic disorder who presents for f/u of his chronic medical conditions.  HTN: BP is WNL today. Takes amlodipine  10 mg QD, lisinopril  20 mg QD and chlorthalidone  12.5 mg QD regularly. Patient denies headache, dizziness, chest pain, dyspnea or palpitations.  He reports improvement in chronic leg swelling with chlorthalidone  now.  GERD: He is currently taking Nexium  40 mg QD currently.  Denies any nausea or vomiting currently.  Denies any melena or hematochezia currently.  Panic disorder: He reports improvement in anxiety with Celexa .  He has had episodes of panic in crowded places and while flying.  He feels better with Xanax  usually, but takes it rarely. Denies anhedonia, insomnia, SI or HI currently.  Obesity: He is tolerating Zepbound  well.  He has lost about 7 lbs in 5 weeks.  He appears motivated to increase dose of Zepbound  in addition to continuing to follow low-carb diet and increase physical activity.  He had been trying to follow low-carb, low-salt diet for 6 months prior to starting Zepbound .     Past Medical History:  Diagnosis Date   Anxiety    Arthritis    left knee   GERD (gastroesophageal reflux disease)    Hypercholesteremia    Hypertension     Past Surgical History:  Procedure Laterality Date   ANTERIOR CERVICAL DECOMP/DISCECTOMY FUSION N/A 05/09/2019   Procedure: C4-5, C5-6 ANTERIOR CERVICAL DECOMPRESSION/DISCECTOMY FUSION, ALLOGRAFT, PLATE;  Surgeon: Barbarann Oneil BROCKS, MD;  Location: [facility];  Service: Orthopedics;  Laterality: N/A;   BONE CYST EXCISION     left leg   COLONOSCOPY WITH PROPOFOL  N/A 03/20/2023   Procedure:  COLONOSCOPY WITH PROPOFOL ;  Surgeon: Shaaron Lamar HERO, MD;  Location: [facility];  Service: Endoscopy;  Laterality: N/A;  7:30 am, asa 3   ESOPHAGOGASTRODUODENOSCOPY (EGD) WITH PROPOFOL  N/A 03/20/2023   Procedure: ESOPHAGOGASTRODUODENOSCOPY (EGD) WITH PROPOFOL ;  Surgeon: Shaaron Lamar HERO, MD;  Location: [facility];  Service: Endoscopy;  Laterality: N/A;   INCISION AND DRAINAGE Right    hand   TOTAL KNEE ARTHROPLASTY Left 03/16/2020   Procedure: LEFT TOTAL KNEE ARTHROPLASTY;  Surgeon: Vernetta Lonni GRADE, MD;  Location: [facility];  Service: Orthopedics;  Laterality: Left;    Family History  Problem Relation Age of Onset   Cancer Father    Cancer Sister    Heart disease Paternal Grandmother    Colon cancer Neg Hx    Colon polyps Neg Hx     Social History   Socioeconomic History   Marital status: Married    Spouse name: Not on file   Number of children: 3   Years of education: Not on file   Highest education level: Not on file  Occupational History   Occupation: Self-employed- [employer]  Tobacco Use   Smoking status: Never   Smokeless tobacco: Never  Vaping Use   Vaping status: Never Used  Substance and Sexual Activity   Alcohol use: Not Currently    Comment: 12+ beer over a couple days on the weekend. May also drink during the week.  Drug use: Never   Sexual activity: Yes  Other Topics Concern   Not on file  Social History Narrative   Not on file   Social Drivers of Health   Financial Resource Strain: Not on file  Food Insecurity: Not on file  Transportation Needs: Not on file  Physical Activity: Not on file  Stress: Not on file  Social Connections: Not on file  Intimate Partner Violence: Not on file    Outpatient Medications Prior to Visit  Medication Sig Dispense Refill   ALPRAZolam  (XANAX ) 0.5 MG tablet Take 1 tablet (0.5 mg total) by mouth 2 (two) times daily as needed for anxiety. 30 tablet 0   amLODipine  (NORVASC ) 10 MG tablet Take 1 tablet (10 mg total)  by mouth daily. 90 tablet 1   chlorthalidone  (HYGROTON ) 25 MG tablet Take 0.5 tablets (12.5 mg total) by mouth daily. 45 tablet 1   esomeprazole  (NEXIUM ) 40 MG capsule TAKE (1) CAPSULE BY MOUTH ONCE DAILY. 30 capsule 5   fluticasone  (FLONASE ) 50 MCG/ACT nasal spray Place 2 sprays into both nostrils daily. 16 g 2   lisinopril  (ZESTRIL ) 20 MG tablet TAKE 1 TABLET BY MOUTH ONCE DAILY 90 tablet 3   naproxen sodium (ALEVE) 220 MG tablet Take 660 mg by mouth daily as needed (pain.).     simvastatin  (ZOCOR ) 40 MG tablet TAKE (1) TABLET BY MOUTH AT BEDTIME. 30 tablet 0   Vitamin D -Vitamin K (D3 + K2 PO) Take 1 tablet by mouth in the morning.     citalopram  (CELEXA ) 10 MG tablet Take 1 tablet (10 mg total) by mouth daily. 30 tablet 3   tirzepatide  (ZEPBOUND ) 2.5 MG/0.5ML injection vial Inject 2.5 mg into the skin once a week. 2 mL 0   No facility-administered medications prior to visit.    Allergies  Allergen Reactions   Morphine And Codeine Itching   Shrimp [Shellfish Allergy] Swelling    Eyes/Throat swelling    ROS Review of Systems  Constitutional:  Negative for chills and fever.  HENT:  Negative for congestion and sore throat.   Eyes:  Negative for pain and discharge.  Respiratory:  Negative for cough and shortness of breath.   Cardiovascular:  Negative for chest pain and palpitations.  Gastrointestinal:  Negative for diarrhea, nausea and vomiting.  Endocrine: Negative for polydipsia and polyuria.  Genitourinary:  Negative for dysuria and hematuria.  Musculoskeletal:  Positive for back pain. Negative for arthralgias, neck pain and neck stiffness.  Skin:  Negative for rash.  Neurological:  Negative for dizziness and weakness.  Psychiatric/Behavioral:  Negative for agitation and behavioral problems. The patient is nervous/anxious.       Objective:    Physical Exam Vitals reviewed.  Constitutional:      General: He is not in acute distress.    Appearance: He is obese. He is not  diaphoretic.  HENT:     Head: Normocephalic and atraumatic.     Nose: Nose normal.     Mouth/Throat:     Mouth: Mucous membranes are moist.  Eyes:     General: No scleral icterus.    Extraocular Movements: Extraocular movements intact.  Cardiovascular:     Rate and Rhythm: Normal rate and regular rhythm.     Heart sounds: Normal heart sounds. No murmur heard. Pulmonary:     Breath sounds: Normal breath sounds. No wheezing or rales.  Musculoskeletal:     Cervical back: Neck supple. No tenderness.     Lumbar back: Tenderness present.  Right lower leg: No edema.     Left lower leg: No edema.  Skin:    General: Skin is warm.     Findings: No rash.  Neurological:     General: No focal deficit present.     Mental Status: He is alert and oriented to person, place, and time.     Sensory: No sensory deficit.     Motor: No weakness.  Psychiatric:        Mood and Affect: Mood normal.        Behavior: Behavior normal.     BP 134/70 (BP Location: Left Arm)   Pulse 87   Ht 6' 4 (1.93 m)   Wt 283 lb 12.8 oz (128.7 kg)   SpO2 98%   BMI 34.55 kg/m  Wt Readings from Last 3 Encounters:  08/24/24 283 lb 12.8 oz (128.7 kg)  07/18/24 290 lb 9.6 oz (131.8 kg)  06/21/24 282 lb (127.9 kg)    Lab Results  Component Value Date   TSH 0.803 05/27/2024   Lab Results  Component Value Date   WBC 7.9 05/27/2024   HGB 15.2 05/27/2024   HCT 45.2 05/27/2024   MCV 93 05/27/2024   PLT 218 05/27/2024   Lab Results  Component Value Date   NA 136 08/22/2024   K 4.2 08/22/2024   CO2 21 08/22/2024   GLUCOSE 85 08/22/2024   BUN 13 08/22/2024   CREATININE 1.05 08/22/2024   BILITOT 0.3 08/22/2024   ALKPHOS 69 08/22/2024   AST 21 08/22/2024   ALT 18 08/22/2024   PROT 7.2 08/22/2024   ALBUMIN  4.3 08/22/2024   CALCIUM  9.8 08/22/2024   ANIONGAP 9 03/09/2020   EGFR 83 08/22/2024   Lab Results  Component Value Date   CHOL 180 05/27/2024   Lab Results  Component Value Date   HDL 59  05/27/2024   Lab Results  Component Value Date   LDLCALC 99 05/27/2024   Lab Results  Component Value Date   TRIG 126 05/27/2024   Lab Results  Component Value Date   CHOLHDL 3.1 05/27/2024   Lab Results  Component Value Date   HGBA1C 5.8 (H) 05/27/2024      Assessment & Plan:   Problem List Items Addressed This Visit       Cardiovascular and Mediastinum   HTN (hypertension)   BP Readings from Last 1 Encounters:  08/24/24 134/70   Well-controlled with amlodipine  10 mg QD, lisinopril  20 mg QD and  chlorthalidone  12.5 mg QD for now Counseled for compliance with the medications Advised DASH diet and moderate exercise/walking, at least 150 mins/week        Digestive   GERD (gastroesophageal reflux disease)   Was uncontrolled with Protonix  Switched to Nexium  40 mg daily, now better Advised to take Pepcid at nighttime as needed Mild nausea with Zepbound , prescribed Zofran  Avoid caffeine related products and take dinner at least 2 hours prior to bedtime Followed by GI      Relevant Medications   ondansetron  (ZOFRAN ) 4 MG tablet     Other   Prediabetes   Lab Results  Component Value Date   HGBA1C 5.8 (H) 05/27/2024   Advised to follow DASH diet      Relevant Orders   CMP14+EGFR   Hemoglobin A1c   GAD (generalized anxiety disorder)   Well-controlled now with Celexa  10 mg QD Has situational anxiety spells leading to panic episodes at times as well Has Xanax  0.5 mg as needed Relaxation  techniques advised      Relevant Medications   citalopram  (CELEXA ) 10 MG tablet   Obesity (BMI 30-39.9) - Primary   BMI Readings from Last 3 Encounters:  08/24/24 34.55 kg/m  07/18/24 35.37 kg/m  06/21/24 34.33 kg/m   Advised to continue to follow low-carb diet and perform moderate exercise/walking at least 150 minutes/week Discussed about medical weight loss options, including benefits and side effects He would benefit from GIP/GLP-1 agonist therapy Continue  Zepbound -initial BMI: 35.37, plan to increase dose as tolerated - prescription of 10 mg sent, he agrees to take half vial once weekly to achieve 5 mg qw dose (His wife is [employer] and helps him with dosing)      Relevant Medications   tirzepatide  10 MG/0.5ML injection vial   Other Visit Diagnoses       Nausea       Relevant Medications   ondansetron  (ZOFRAN ) 4 MG tablet         Meds ordered this encounter  Medications   tirzepatide  10 MG/0.5ML injection vial    Sig: Inject 10 mg into the skin once a week.    Dispense:  2 mL    Refill:  2   ondansetron  (ZOFRAN ) 4 MG tablet    Sig: Take 1 tablet (4 mg total) by mouth every 8 (eight) hours as needed for nausea or vomiting.    Dispense:  20 tablet    Refill:  0   citalopram  (CELEXA ) 10 MG tablet    Sig: Take 1 tablet (10 mg total) by mouth daily.    Dispense:  90 tablet    Refill:  1    Follow-up: Return in about 4 months (around 12/23/2024) for HTN and weight management.    Suzzane MARLA Blanch, MD

## 2024-08-24 NOTE — Assessment & Plan Note (Signed)
 Well-controlled now with Celexa  10 mg QD Has situational anxiety spells leading to panic episodes at times as well Has Xanax  0.5 mg as needed Relaxation techniques advised

## 2024-08-24 NOTE — Patient Instructions (Signed)
 Please start taking Zepbound  5 mg as discussed after you complete 2.5 mg doses.  Please continue to take medications as prescribed.  Please continue to follow low carb diet and perform moderate exercise/walking at least 150 mins/week.  Please get fasting blood tests done before the next visit.

## 2024-08-24 NOTE — Assessment & Plan Note (Addendum)
 BMI Readings from Last 3 Encounters:  08/24/24 34.55 kg/m  07/18/24 35.37 kg/m  06/21/24 34.33 kg/m   Advised to continue to follow low-carb diet and perform moderate exercise/walking at least 150 minutes/week Discussed about medical weight loss options, including benefits and side effects He would benefit from GIP/GLP-1 agonist therapy Continue Zepbound -initial BMI: 35.37, plan to increase dose as tolerated - prescription of 10 mg sent, he agrees to take half vial once weekly to achieve 5 mg qw dose (His wife is [employer] and helps him with dosing)

## 2024-08-24 NOTE — Assessment & Plan Note (Addendum)
 Was uncontrolled with Protonix  Switched to Nexium  40 mg daily, now better Advised to take Pepcid at nighttime as needed Mild nausea with Zepbound , prescribed Zofran  Avoid caffeine related products and take dinner at least 2 hours prior to bedtime Followed by GI

## 2024-08-24 NOTE — Assessment & Plan Note (Signed)
 Lab Results  Component Value Date   HGBA1C 5.8 (H) 05/27/2024   Advised to follow DASH diet

## 2024-08-25 ENCOUNTER — Other Ambulatory Visit: Payer: Self-pay | Admitting: Internal Medicine

## 2024-08-25 DIAGNOSIS — J309 Allergic rhinitis, unspecified: Secondary | ICD-10-CM

## 2024-08-29 ENCOUNTER — Ambulatory Visit: Payer: BC Managed Care – PPO | Admitting: Urology

## 2024-08-29 DIAGNOSIS — N411 Chronic prostatitis: Secondary | ICD-10-CM

## 2024-09-05 ENCOUNTER — Other Ambulatory Visit: Payer: Self-pay | Admitting: Internal Medicine

## 2024-09-05 DIAGNOSIS — E782 Mixed hyperlipidemia: Secondary | ICD-10-CM

## 2024-09-06 ENCOUNTER — Other Ambulatory Visit: Payer: Self-pay | Admitting: Internal Medicine

## 2024-09-06 DIAGNOSIS — E669 Obesity, unspecified: Secondary | ICD-10-CM

## 2024-09-12 ENCOUNTER — Telehealth: Payer: Self-pay | Admitting: Internal Medicine

## 2024-09-12 MED ORDER — TIRZEPATIDE-WEIGHT MANAGEMENT 10 MG/0.5ML ~~LOC~~ SOLN: 10.0000 mg | SUBCUTANEOUS | 2 refills | Status: AC

## 2024-09-12 NOTE — Addendum Note (Signed)
 Addended byBETHA TOBIE DOWNS on: 09/12/2024 11:36 AM   Modules accepted: Orders

## 2024-09-12 NOTE — Telephone Encounter (Signed)
 Patient came into office needs med refill tirzepatide  10 MG/0.5ML injection vial [[id number]]   Pharmacy  [facility] [place], [place] - [phone], [place] [id number] Phone: [phone]  Fax: [phone] DEA #: --

## 2024-10-17 ENCOUNTER — Other Ambulatory Visit: Payer: Self-pay | Admitting: Internal Medicine

## 2024-10-17 DIAGNOSIS — R110 Nausea: Secondary | ICD-10-CM

## 2024-10-24 ENCOUNTER — Other Ambulatory Visit: Payer: Self-pay | Admitting: Internal Medicine

## 2024-10-24 DIAGNOSIS — I10 Essential (primary) hypertension: Secondary | ICD-10-CM

## 2024-12-23 ENCOUNTER — Ambulatory Visit: Admitting: Internal Medicine
